# Patient Record
Sex: Male | Born: 1945 | Race: Black or African American | Hispanic: No | Smoking: Never smoker
Health system: Southern US, Community
[De-identification: ages and names within clinical notes are randomized; demographics above are authoritative.]

## PROBLEM LIST (undated history)

## (undated) DIAGNOSIS — I2510 Atherosclerotic heart disease of native coronary artery without angina pectoris: Secondary | ICD-10-CM

## (undated) DIAGNOSIS — E119 Type 2 diabetes mellitus without complications: Secondary | ICD-10-CM

## (undated) DIAGNOSIS — I219 Acute myocardial infarction, unspecified: Secondary | ICD-10-CM

## (undated) DIAGNOSIS — M109 Gout, unspecified: Secondary | ICD-10-CM

## (undated) DIAGNOSIS — I10 Essential (primary) hypertension: Secondary | ICD-10-CM

## (undated) DIAGNOSIS — Z87442 Personal history of urinary calculi: Secondary | ICD-10-CM

## (undated) DIAGNOSIS — N529 Male erectile dysfunction, unspecified: Secondary | ICD-10-CM

## (undated) DIAGNOSIS — I639 Cerebral infarction, unspecified: Secondary | ICD-10-CM

## (undated) DIAGNOSIS — E785 Hyperlipidemia, unspecified: Secondary | ICD-10-CM

## (undated) DIAGNOSIS — I499 Cardiac arrhythmia, unspecified: Secondary | ICD-10-CM

## (undated) DIAGNOSIS — N189 Chronic kidney disease, unspecified: Secondary | ICD-10-CM

## (undated) HISTORY — DX: Male erectile dysfunction, unspecified: N52.9

## (undated) HISTORY — DX: Chronic kidney disease, unspecified: N18.9

## (undated) HISTORY — DX: Atherosclerotic heart disease of native coronary artery without angina pectoris: I25.10

## (undated) HISTORY — PX: COLONOSCOPY: SHX174

## (undated) HISTORY — DX: Cerebral infarction, unspecified: I63.9

## (undated) HISTORY — PX: CATARACT EXTRACTION W/ INTRAOCULAR LENS  IMPLANT, BILATERAL: SHX1307

## (undated) HISTORY — PX: KNEE SURGERY: SHX244

## (undated) HISTORY — DX: Type 2 diabetes mellitus without complications: E11.9

## (undated) HISTORY — DX: Hyperlipidemia, unspecified: E78.5

## (undated) HISTORY — PX: MULTIPLE TOOTH EXTRACTIONS: SHX2053

---

## 1995-01-15 HISTORY — PX: CARDIAC CATHETERIZATION: SHX172

## 2001-10-05 ENCOUNTER — Encounter: Admission: RE | Admit: 2001-10-05 | Discharge: 2002-01-03 | Payer: Self-pay | Admitting: Family Medicine

## 2002-06-30 ENCOUNTER — Emergency Department (HOSPITAL_COMMUNITY): Admission: EM | Admit: 2002-06-30 | Discharge: 2002-06-30 | Payer: Self-pay | Admitting: Emergency Medicine

## 2003-02-21 ENCOUNTER — Inpatient Hospital Stay (HOSPITAL_COMMUNITY): Admission: RE | Admit: 2003-02-21 | Discharge: 2003-02-24 | Payer: Self-pay | Admitting: Interventional Cardiology

## 2003-03-21 ENCOUNTER — Encounter: Admission: RE | Admit: 2003-03-21 | Discharge: 2003-06-19 | Payer: Self-pay | Admitting: Interventional Cardiology

## 2003-06-20 ENCOUNTER — Encounter (HOSPITAL_COMMUNITY): Admission: RE | Admit: 2003-06-20 | Discharge: 2003-09-18 | Payer: Self-pay | Admitting: Interventional Cardiology

## 2005-08-24 ENCOUNTER — Emergency Department (HOSPITAL_COMMUNITY): Admission: EM | Admit: 2005-08-24 | Discharge: 2005-08-24 | Payer: Self-pay | Admitting: Emergency Medicine

## 2006-07-15 ENCOUNTER — Encounter: Admission: RE | Admit: 2006-07-15 | Discharge: 2006-07-15 | Payer: Self-pay | Admitting: Specialist

## 2007-07-04 ENCOUNTER — Emergency Department (HOSPITAL_COMMUNITY): Admission: EM | Admit: 2007-07-04 | Discharge: 2007-07-05 | Payer: Self-pay | Admitting: Emergency Medicine

## 2008-01-15 HISTORY — PX: CARDIAC CATHETERIZATION: SHX172

## 2008-09-07 ENCOUNTER — Inpatient Hospital Stay (HOSPITAL_COMMUNITY): Admission: AD | Admit: 2008-09-07 | Discharge: 2008-09-09 | Payer: Self-pay | Admitting: Interventional Cardiology

## 2008-09-22 ENCOUNTER — Encounter (HOSPITAL_COMMUNITY): Admission: RE | Admit: 2008-09-22 | Discharge: 2008-12-21 | Payer: Self-pay | Admitting: Interventional Cardiology

## 2008-11-05 ENCOUNTER — Observation Stay (HOSPITAL_COMMUNITY): Admission: EM | Admit: 2008-11-05 | Discharge: 2008-11-05 | Payer: Self-pay | Admitting: Emergency Medicine

## 2008-12-22 ENCOUNTER — Encounter (HOSPITAL_COMMUNITY): Admission: RE | Admit: 2008-12-22 | Discharge: 2009-01-10 | Payer: Self-pay | Admitting: Interventional Cardiology

## 2010-04-17 LAB — GLUCOSE, CAPILLARY
Glucose-Capillary: 111 mg/dL — ABNORMAL HIGH (ref 70–99)
Glucose-Capillary: 211 mg/dL — ABNORMAL HIGH (ref 70–99)

## 2010-04-19 LAB — DIFFERENTIAL
Basophils Absolute: 0 10*3/uL (ref 0.0–0.1)
Basophils Relative: 0 % (ref 0–1)
Eosinophils Absolute: 0.1 10*3/uL (ref 0.0–0.7)
Eosinophils Relative: 1 % (ref 0–5)
Lymphocytes Relative: 27 % (ref 12–46)
Lymphs Abs: 2.5 10*3/uL (ref 0.7–4.0)
Neutro Abs: 5.7 10*3/uL (ref 1.7–7.7)
Neutrophils Relative %: 63 % (ref 43–77)

## 2010-04-19 LAB — POCT I-STAT, CHEM 8
BUN: 18 mg/dL (ref 6–23)
Calcium, Ion: 1.14 mmol/L (ref 1.12–1.32)
Glucose, Bld: 213 mg/dL — ABNORMAL HIGH (ref 70–99)
HCT: 38 % — ABNORMAL LOW (ref 39.0–52.0)
Sodium: 142 mEq/L (ref 135–145)

## 2010-04-19 LAB — GLUCOSE, CAPILLARY
Glucose-Capillary: 164 mg/dL — ABNORMAL HIGH (ref 70–99)
Glucose-Capillary: 180 mg/dL — ABNORMAL HIGH (ref 70–99)
Glucose-Capillary: 71 mg/dL (ref 70–99)
Glucose-Capillary: 95 mg/dL (ref 70–99)

## 2010-04-19 LAB — CK TOTAL AND CKMB (NOT AT ARMC)
CK, MB: 3 ng/mL (ref 0.3–4.0)
Relative Index: 1.7 (ref 0.0–2.5)
Total CK: 172 U/L (ref 7–232)

## 2010-04-19 LAB — CARDIAC PANEL(CRET KIN+CKTOT+MB+TROPI): Relative Index: 1.9 (ref 0.0–2.5)

## 2010-04-19 LAB — CBC
Hemoglobin: 12.4 g/dL — ABNORMAL LOW (ref 13.0–17.0)
MCHC: 33.9 g/dL (ref 30.0–36.0)
RBC: 4.32 MIL/uL (ref 4.22–5.81)
RDW: 14.4 % (ref 11.5–15.5)

## 2010-04-19 LAB — LIPID PANEL
Triglycerides: 156 mg/dL — ABNORMAL HIGH (ref <150)
VLDL: 31 mg/dL (ref 0–40)

## 2010-04-19 LAB — POCT CARDIAC MARKERS
CKMB, poc: 2.4 ng/mL (ref 1.0–8.0)
Troponin i, poc: <0.05 ng/mL (ref 0.00–0.09)

## 2010-04-19 LAB — TSH: TSH: 1.288 u[IU]/mL (ref 0.350–4.500)

## 2010-04-19 LAB — HEMOGLOBIN A1C: Hgb A1c MFr Bld: 7.8 % — ABNORMAL HIGH (ref 4.6–6.1)

## 2010-04-20 LAB — GLUCOSE, CAPILLARY
Glucose-Capillary: 137 mg/dL — ABNORMAL HIGH (ref 70–99)
Glucose-Capillary: 145 mg/dL — ABNORMAL HIGH (ref 70–99)

## 2010-04-21 LAB — COMPREHENSIVE METABOLIC PANEL
ALT: 21 U/L (ref 0–53)
AST: 17 U/L (ref 0–37)
Albumin: 3.5 g/dL (ref 3.5–5.2)
Alkaline Phosphatase: 89 U/L (ref 39–117)
BUN: 9 mg/dL (ref 6–23)
CO2: 27 mEq/L (ref 19–32)
Calcium: 9 mg/dL (ref 8.4–10.5)
Calcium: 9.5 mg/dL (ref 8.4–10.5)
Creatinine, Ser: 1.03 mg/dL (ref 0.4–1.5)
GFR calc Af Amer: >60 mL/min (ref >60)
GFR calc non Af Amer: >60 mL/min (ref >60)
Potassium: 3.9 mEq/L (ref 3.5–5.1)
Sodium: 139 mEq/L (ref 135–145)
Total Protein: 5.7 g/dL — ABNORMAL LOW (ref 6.0–8.3)
Total Protein: 6.3 g/dL (ref 6.0–8.3)

## 2010-04-21 LAB — GLUCOSE, CAPILLARY
Glucose-Capillary: 104 mg/dL — ABNORMAL HIGH (ref 70–99)
Glucose-Capillary: 126 mg/dL — ABNORMAL HIGH (ref 70–99)
Glucose-Capillary: 162 mg/dL — ABNORMAL HIGH (ref 70–99)
Glucose-Capillary: 164 mg/dL — ABNORMAL HIGH (ref 70–99)
Glucose-Capillary: 186 mg/dL — ABNORMAL HIGH (ref 70–99)
Glucose-Capillary: 194 mg/dL — ABNORMAL HIGH (ref 70–99)
Glucose-Capillary: 66 mg/dL — ABNORMAL LOW (ref 70–99)

## 2010-04-21 LAB — BASIC METABOLIC PANEL
BUN: 11 mg/dL (ref 6–23)
Calcium: 8.9 mg/dL (ref 8.4–10.5)
Creatinine, Ser: 1.1 mg/dL (ref 0.4–1.5)
GFR calc non Af Amer: >60 mL/min (ref >60)
Glucose, Bld: 144 mg/dL — ABNORMAL HIGH (ref 70–99)

## 2010-04-21 LAB — DIFFERENTIAL
Basophils Relative: 1 % (ref 0–1)
Lymphocytes Relative: 37 % (ref 12–46)
Lymphs Abs: 2.6 10*3/uL (ref 0.7–4.0)
Monocytes Absolute: 0.4 10*3/uL (ref 0.1–1.0)
Monocytes Relative: 6 % (ref 3–12)
Neutro Abs: 3.9 10*3/uL (ref 1.7–7.7)

## 2010-04-21 LAB — PROTIME-INR
INR: 1 (ref 0.00–1.49)
Prothrombin Time: 14.1 seconds (ref 11.6–15.2)

## 2010-04-21 LAB — CARDIAC PANEL(CRET KIN+CKTOT+MB+TROPI)
CK, MB: 2.2 ng/mL (ref 0.3–4.0)
CK, MB: 4.5 ng/mL — ABNORMAL HIGH (ref 0.3–4.0)
Relative Index: 1.9 (ref 0.0–2.5)
Total CK: 117 U/L (ref 7–232)
Total CK: 128 U/L (ref 7–232)
Troponin I: 0.01 ng/mL (ref 0.00–0.06)
Troponin I: 0.01 ng/mL (ref 0.00–0.06)
Troponin I: 0.01 ng/mL (ref 0.00–0.06)

## 2010-04-21 LAB — CBC
HCT: 36.6 % — ABNORMAL LOW (ref 39.0–52.0)
HCT: 38.9 % — ABNORMAL LOW (ref 39.0–52.0)
Hemoglobin: 12.1 g/dL — ABNORMAL LOW (ref 13.0–17.0)
MCHC: 32.9 g/dL (ref 30.0–36.0)
MCHC: 33.2 g/dL (ref 30.0–36.0)
MCHC: 33.3 g/dL (ref 30.0–36.0)
MCV: 84.3 fL (ref 78.0–100.0)
Platelets: 226 10*3/uL (ref 150–400)
RDW: 13.9 % (ref 11.5–15.5)
RDW: 14 % (ref 11.5–15.5)
RDW: 14.1 % (ref 11.5–15.5)

## 2010-04-21 LAB — APTT
aPTT: 34 seconds (ref 24–37)
aPTT: 40 seconds — ABNORMAL HIGH (ref 24–37)

## 2010-05-29 NOTE — Discharge Summary (Signed)
NAME:  TATEZayn, Selley                  ACCOUNT NO.:  [id number]   MEDICAL RECORD NO.:  [id number]          PATIENT TYPE:  INP   LOCATION:  2506                         FACILITY:  [facility]   PHYSICIAN:  Lyn Records, M.D.   DATE OF BIRTH:  Dec 29, 1945   DATE OF ADMISSION:  09/07/2008  DATE OF DISCHARGE:  09/09/2008                               DISCHARGE SUMMARY   INDICATION FOR ADMISSION:  Progressive angina, class III.   DISCHARGE DIAGNOSES:  1. Class III angina, resolved after re-stenting of the posterior      descending coronary artery for in-stent restenosis on September 08, 2008.  2. Chronic coronary atherosclerotic heart disease.  3. Hypertension, controlled.  4. Diabetes mellitus.  5. Hyperlipidemia.   PROCEDURES PERFORMED:  Drug-eluting stent implantation of posterior  descending coronary artery and cutting balloon angioplasty, ramus  intermedius branch on September 08, 2008.   MEDICATIONS:  1. Plavix 75 mg daily.  2. Aspirin 325 mg daily.  3. Nitroglycerin 0.4 mg sublingually p.r.n.  4. Folic acid 400 mcg per day.  5. Multiple vitamins 1 per day.  6. Avapro 150 mg per day.  7. Toprol-XL 25 mg per day.  8. Omega-3 fatty acid 1000 mg 3 per day.  9. Metformin 1000 mg twice per day.  10.Humalog 75/25 Mix 50 units with the first meal of the day.  11.Humalog Mix 75/25, 20 units with evening meal each day.  12.Lipitor 40 mg daily.  13.Actos 45 mg daily.   ACTIVITY:  Progress as indicated by Cardiac Rehab.  Return to work on  September 14, 2008.  Enroll in phase II cardiac rehab.   CONDITION ON DISCHARGE:  Improved.  The patient is specifically  instructed to call if recurrent angina.   HISTORY AND PHYSICAL AND HOSPITAL COURSE:  Mr. Cottrell Gentles is 65 years of  age and has a prior history of coronary artery disease with drug-eluting  stent implantation in the LAD and right coronary arteries in 2005.  Prior inferior myocardial infarction in 1999, treated with angioplasty.  He  was admitted with a 1-2 weeks' history of angina of variable  threshold and ultimately pain at rest on the day of admission.  Cardiac  markers were negative.   The following day, he was taken to the Catheterization Laboratory on  September 08, 2008, and found to have high-grade focal in-stent restenosis  in the drug-eluting stent in the posterior descending artery and ostial  stenosis in the ramus intermedius branch and just proximal to a drug-  eluting stent in this vessel.  He underwent successful re-stenting using  a drug-eluting stent of the PDA and cutting balloon angioplasty on the  ostium of the ramus intermedius.  Subsequently, his hospital stay was  uneventful.  No recurrent angina was noted.   LABORATORY DATA ON DISCHARGE FROM THE HOSPITAL:  Creatinine of 1.1,  hemoglobin of 12.1, and blood sugar of 144.   The patient is restarted on Plavix 75 mg per day.  He is encouraged to  enroll in cardiac rehab.  Clinical followup with  Dr. Verdis Prime is set  for September 21, 2008, at 9 a.m.      Lyn Records, M.D.  Electronically Signed     HWS/MEDQ  D:  09/09/2008  T:  09/09/2008  Job:  [id number]

## 2010-05-29 NOTE — Cardiovascular Report (Signed)
NAME:  Joseph Sanford, Joseph Sanford                  ACCOUNT NO.:  [id number]   MEDICAL RECORD NO.:  [id number]          PATIENT TYPE:  INP   LOCATION:  2506                         FACILITY:  [facility]   PHYSICIAN:  Lyn Records, M.D.   DATE OF BIRTH:  [birth year]   DATE OF PROCEDURE:  09/08/2008  DATE OF DISCHARGE:                            CARDIAC CATHETERIZATION   INDICATIONS:  Angina at rest responsive to nitroglycerin for the past  week prior to admission.   PROCEDURES PERFORMED:  1. Left heart cath.  2. Coronary angiography.  3. Left ventriculography.  4. Drug-eluting stent implantation in the posterior descending artery      for in-stent restenosis, focal   DESCRIPTION:  The patient was brought to the cath lab in the  postabsorptive state.  A 6-French sheath was started in the right  femoral artery using the modified Seldinger technique.  We then used a 6-  [place] A2 multipurpose catheter for hemodynamic recordings, left  ventriculography by hand injection, and selective left and right  coronary angiography.  A 200 mcg of intracoronary nitroglycerin was  administered into the left main and into the right coronary prior to  angiography of each vessel.   After reviewing the digital images, we identified diffuse disease  throughout many smaller vascular territories including the diagonal #2,  which is jailed by previous LAD stent, the ramus intermedius, which  contains a midvessel severe bifurcation lesion, the obtuse marginal  branches #3 contained diffuse disease, and the mid RCA contains  segmental 50% narrowing.  The previous distal RCA stent is widely  patent.  The PDA stent contained a high-grade focal proximal ISR.  We  felt that this was the likely source of the patient's angina at rest is  the most reasonable target to treat.   The patient was given an additional 225 mg of oral Plavix.  A 0.75 mg/kg  bolus of Angiomax was given followed by 1.75 mg/kg/hour infusion.  ACT  was  documented to be greater than 300.  PCI on the right coronary was  then performed using a shepherd's crook 6-French right coronary guide  catheter, a Prowater Asahi guidewire, and an initial 2.5 mm diameter x 6  mm long cutting balloon.  After a 1-minute 30-second cutting balloon  dilatation, and 8 mm long x 2.5 mm XIENCE V stent was deployed at 14  atmospheres.  Postdilatation was performed with a 6 mm long x 2.75 mm Garey  Voyager balloon to 13 atmospheres.  A very nice angiographic result was  obtained and the case was terminated.   Angio-Seal was used for hemostasis with good results.   RESULTS:  1. Hemodynamic data:      a.     Aortic pressure 100/57.      b.     Left ventricular pressure 106/80 mmHg.  The patient was on       20 mcg of nitro upon arrival in the cath lab and during pressure       recordings.  The nitro was cut in half after measuring the EDP.  2. Left  ventriculography:  The inferior wall was hypokinetic.  Overall      function is normal with EF of 50%.  3. Coronary angiography:      a.     Left main coronary:  Widely patent.      b.     Left anterior descending coronary:  The LAD is a large       vessel that wraps around left ventricular apex.  It contains a mid       vessel stent that is widely patent.  Irregularities was up to 50%       narrowing noted in the mid and proximal LAD.  No high-grade       obstruction is present.  The second diagonal, which is jailed by       the stent contains ostial 60% narrowing.      c.     Circumflex artery:  Circumflex gives origin to 4 small       obtuse marginal branches.  Each is diffusely diseased and none are       graftable.  The proximal circumflex contains an eccentric 50%       lesion.      d.     Ramus branch:  The ramus intermedius is moderate in size and       bifurcates on anterolateral wall.  At the bifurcation, there is a       complex 70% stenosis involving the branch as well as the ramus       both have the  origin of the branch and proximal and distal to the       branch.  This could be potentially treated with PCI if necessary       in the future.      e.     Right coronary:  The right coronary artery is dominant       vessel giving origin to a large PDA and large left ventricular       branch.  The mid right coronary contains diffuse disease with       obstruction up to 50%, but no focal disease is noted.  The distal       RCA contains an eccentric 40% stenosis proximal to a large widely       patent Cypher stent before the bifurcation into the PDA and       continuation of the right coronary.  The PDA is large and contains       high-grade proximal focal in-stent restenosis in the previously       placed stent.  The continuation of the right coronary contains an       eccentric 70-80% stenosis in its proximal segment that is for the       most part, unchanged from the prior study.  4. PCI of the PDA:  95% stenosis reduced to 0% with TIMI grade 3 flow.   CONCLUSIONS:  1. Severe in-stent restenosis within the posterior descending artery.      This is a focal region of restenosis.  2. There is disease involving other smaller branches including an 80%      ramus intermedius bifurcation lesion, 60-70% lesion in the      posterolateral obtuse marginal branch of the right coronary artery,      diffuse disease in all 3 or 4 obtuse marginal branches, less than      50% stenosis in the mid and proximal left anterior descending, and  70% stenosis in the second diagonal, which is jailed by the left      anterior descending stent.  3. Widely patent distal right coronary and mid left anterior      descending stents.  4. Overall normal left ventricular function.  5. Successful drug-eluting stent implantation in the proximal      posterior descending artery with reduction in 95% stenosis to 0%      with TIMI grade 3 flow.   PLAN:  Continue aspirin and Plavix.  Discontinue Angiomax.  Home on   September 09, 2008.      Lyn Records, M.D.  Electronically Signed     HWS/MEDQ  D:  09/08/2008  T:  09/09/2008  Job:  [id number]   cc:   Donia Guiles, M.D.

## 2010-06-01 NOTE — Cardiovascular Report (Signed)
NAME:  Joseph Sanford, Joseph Sanford                            ACCOUNT NO.:  [id number]   MEDICAL RECORD NO.:  [id number]                   PATIENT TYPE:  OIB   LOCATION:  2899                                 FACILITY:  [facility]   PHYSICIAN:  Lyn Records III, M.D.            DATE OF BIRTH:  01-Jul-1945   DATE OF PROCEDURE:  02/21/2003  DATE OF DISCHARGE:                              CARDIAC CATHETERIZATION   INDICATIONS FOR PROCEDURE:  Recent change in EKG to include anterior Q wave  suggestive of new anterior myocardial infarction, silent.  Also, recent  exertional discomfort.   DATE OF PROCEDURE:  February 21, 2003.   PROCEDURE PERFORMED:  1. Left heart catheterization.  2. Selective coronary angiography.  3. Left ventriculography.  4. Left anterior descending stent implantation, Taxus drug-eluting.   DESCRIPTION:  After informed consent, the patient was brought to the cath  lab and a 6-French sheath was placed in the right femoral artery using  modified Seldinger technique.  A 6-French A2 multipurpose catheter was then  used for hemodynamic recordings, left ventriculography by hand injection,  and selective left and right coronary angiography.  The patient tolerated  the procedure without significant complications.   After review of the scintiangiograms, it was felt that the patient either  needed bypass surgery or that PCI with drug-eluting stents in the LAD and  right coronary could be performed.  We decided to perform PCI and a Taxus  drug-eluting stent 3.0 x 16-mm was placed in the mid LAD after predilation  with a 3.0 x 15-mm Maverick balloon.  Peak pressure 12 atmospheres.  The  patient tolerated the procedure without complications.  TIMI-3 flow was  established.  Distal embolization to the very apical LAD was felt to have  occurred.  No symptoms occurred during the procedure or post procedure and  the case was terminated.   The pharmacologic infrastructure for the case including  Plavix 300 mg given  in the lab, double bolus followed by an infusion of integrilin and 5000  units of IV heparin with documentation of an initial ACT of 302 seconds.  Post ACT was 226 seconds.   RESULTS:   I. HEMODYNAMIC DATA:  A.  Left ventricular pressure:  125/12.  B.  Aortic pressure:  132/87.   II. LEFT VENTRICULOGRAPHY:  There is infra-apical hypokinesis.  EF 55%.  No  MR.   III. CORONARY ANGIOGRAPHY:  A.  Left main coronary:  Patent.  There is  ostial 30-40% narrowing.  B.  Left anterior descending coronary:  The LAD is totally occluded after  the first septal perforator.  The LAD fills around the apex via septal  perforators from the PDA of the right coronary.  No significant obstruction  is noted in the LAD other than 100% mid occlusion.  There is proximal 30-50%  narrowing.  C.  Circumflex artery:  This is a relatively small system given  origin to  two small obtuse marginals.  There is proximal 70% narrowing.  D.  Right coronary:  The right coronary is a dominant vessel.  It gives a  large PDA that reaches the left ventricular apex.  The right coronary is a  previously dilated vessel.  There is 50% narrowing in the mid vessel.  There  is 99% stenosis prior to the PDA.  The PDA contains an eccentric 70%  stenosis.  This is a previous angioplasty site and the continuation of the  right coronary artery before the origin of a left ventricular branch has a  70% stenosis.   IV. PERCUTANEOUS CORONARY INTERVENTION:  LAD reduced from 100% to 0% with  TIMI-3 flow being reestablished after implantation of a 16 x 3.0 Taxus DES.  The very distal apical portion of the LAD is occluded probably from  antegrade embolization.   ASSESSMENT:  1. Significant three-vessel coronary disease with 70% circumflex, 100% left     anterior descending, 99% right coronary artery.  2. Mild left ventricular dysfunction, but with preserved LVEF of 55%.  3. Successful recannulization of the left  anterior descending with     implantation of Taxus drug-eluting stent in the mid left anterior     descending.   PLAN:  The patient will undergo right coronary and PDA intervention in 48  hours.  Integrilin will be given for 24 hours.  Plan to use Angiomax on next  PCI.  Plavix will be continued.                                               Lesleigh Noe, M.D.    HWS/MEDQ  D:  02/21/2003  T:  02/21/2003  Job:  [id number]   cc:   Donia Guiles, M.D.  [address] [place]  [place] [address]  Fax: [phone]

## 2010-06-01 NOTE — Cardiovascular Report (Signed)
NAME:  Joseph Sanford, Joseph Sanford                            ACCOUNT NO.:  [id number]   MEDICAL RECORD NO.:  [id number]                   PATIENT TYPE:  OIB   LOCATION:  2903                                 FACILITY:  [facility]   PHYSICIAN:  Lesleigh Noe, M.D.            DATE OF BIRTH:  1945/11/14   DATE OF PROCEDURE:  02/23/2003  DATE OF DISCHARGE:                              CARDIAC CATHETERIZATION   INDICATIONS FOR PROCEDURE:  High grade distal right coronary and PDA disease  identified by diagnostic catheterization on February 21, 2003.   DATE OF PROCEDURE:  February 23, 2003.   PROCEDURE PERFORMED:  1. Coronary angiography.  2. Stent implantation with a Cypher drug-eluting stent in the distal right     coronary artery.  3. Stent implantation in the posterior descending artery, Cypher drug-     eluting stent.   DESCRIPTION:  After informed consent, a 6-French sheath was started in the  right femoral artery using modified Seldinger technique after 2% Xylocaine  local anesthesia/infiltration.  The patient was given 1 mg of IV Versed and  50 mcg of Fentanyl IV.  An Angiomax bolus and infusion was started and ACT  was documented to greater than 250 seconds.   We used a side hole hockey stick guide catheter, 6 [place], and [employer] wire to cross the distal RCA and PDA and then we predilated the  distal RCA and PDA with a 2.5/12 Quantum balloon.  We then deployed a 13/2.5  mm Cypher stent in the PDA and an 18 x 3.0 mm Cypher stent in the distal  RCA, both with excellent angiographic results and TIMI-3 flow.  Angiomax was  discontinued. A sheathogram was performed in the right femoral sheath.  Angio-Seal was performed without complications.  Angiomax was discontinued  at the completion of the case.   CONCLUSION:  1. Successful drug-eluting stent implantation in the distal right coronary     artery taken the lesion from 95% to 0% and successful drug-eluting Cypher     stent implantation  in the posterior descending artery taking an 80%     stenosis to 0%.  2. Successful Angio-Seal post procedure.   PLAN:  1. Because of chronic cough, Accupril will be discontinued and Avapro     started.  2. Angiomax discontinued.  3. Hopeful discharge February 24, 2003.                                               Lesleigh Noe, M.D.    HWS/MEDQ  D:  02/23/2003  T:  02/24/2003  Job:  [id number]   cc:   Donia Guiles, M.D.  [address] [place]  [place] [address]  Fax: [phone]

## 2010-06-01 NOTE — Discharge Summary (Signed)
NAME:  Joseph Sanford, Joseph Sanford                            ACCOUNT NO.:  [id number]   MEDICAL RECORD NO.:  [id number]                   PATIENT TYPE:  INP   LOCATION:  2903                                 FACILITY:  [facility]   PHYSICIAN:  Lyn Records, M.D.                DATE OF BIRTH:  March 17, 1945   DATE OF ADMISSION:  02/21/2003  DATE OF DISCHARGE:  02/24/2003                                 DISCHARGE SUMMARY   ADMISSION DIAGNOSES:  1. New EKG changes.  2. Questionable anterior myocardial infarction.  3. Hypertension.  4. Noninsulin-dependent diabetes.  5. Hyperlipidemia.   DISCHARGE DIAGNOSES:  1. Coronary artery disease.     A. Status post percutaneous coronary intervention and Taxus stent        placement to the left anterior descending artery February 21, 2003.     B. Status post percutaneous coronary intervention and Cypher stent        placement to the distal right coronary artery February 23, 2003.  2. Hypertension, controlled.  3. Hyperlipidemia, stable.  4. Noninsulin-dependent diabetes.   PROCEDURE:  1. Left heart catheterization with percutaneous coronary intervention to the     left anterior descending artery February 21, 2003.  2. Left heart catheterization with percutaneous coronary intervention to the     right coronary artery February 23, 2003.   COMPLICATIONS:  None.   DISCHARGE STATUS:  Stable. Improved.   HISTORY OF PRESENT ILLNESS:  Joseph Sanford is a 65 year old male who presented to  the office exhibiting recent EKG changes showing anterior Q waves. He was  evaluated by Dr. Katrinka Blazing with a question of new recent silent anterior  infarct. He had been having some mild exertional chest discomfort. He was  planned for outpatient cardiac catheterization for further evaluation. He  presented to the office on February 21, 2003 for this outpatient procedure.  Results showed the left main with a 30 to 40% ostial narrowing. The LAD was  100% occluded after the first septal  perforator. It actually fills around  the apex by the septal perforators from the PDA. There was an additional  proximal area of 30 to 50% noted in the LAD. The circumflex had a proximal  area of 70%. The RCA was the dominant vessel. There was an area of 50% in  the mid section and 99% just prior to the PDA. The PDA itself had an  eccentric 70% stenosis. There was an additional area that was a previous  angioplasty site just before the origin of the left ventricular branch with  a 70% stenosis. Ventriculography revealed apical hypokinesis. EF was  estimated at 55%.   That same day, he underwent PCI and Taxus stent placement to the LAD. He had  plans to return to the cardiac catheterization lab in two days for  intervention to the RCA and PDA. In the interim, he was started on IV  Integrilin. Plavix was also continued.   Essentially for the rest of this hospitalization, he was without any  complaints. He had no chest pain. Vital signs all remained within normal  limits. Blood sugars were up somewhat with the metformin being on hold.  Sliding scale coverage was added.   On February 23, 2003, he was taken back to the cardiac catheterization lab.  He underwent successful PCI and Cypher stent placement to the distal RCA.  The patient tolerated both procedures well. There were no complications. At  this point, his Accupril was discontinued by Dr. Katrinka Blazing secondary to  complaints of a nonproductive cough. He was started on Avapro.   By February 24, 2003, the patient was without any complaints. He was up and  ambulating without difficulty. Vital signs were all within normal limits.  Repeat CBC and BMP were normal with the exception of mild hyperglycemia.  Right femoral groin catheterization site was without hematoma or bruising.  He was discharged home later that day without further incident.   DISCHARGE MEDICATIONS:  1. Actos 30 mg daily.  2. Lipitor 40 mg daily.  3. Avapro 150 mg daily.   4. Aspirin 325 mg daily.  5. Plavix 75 mg daily.  6. Toprol-XL 25 mg daily.  7. Nitroglycerin 0.4 mg p.r.n.  8. 70/30 insulin as taken previously.  9. Tussionex one tablespoon b.i.d. p.r.n. cough.   DISCHARGE INSTRUCTIONS:  He has been instructed not to undertake any  strenuous activity for the next two days, i.e. no lifting more than five  pounds, bending, stooping, straining. He is not to drive today. He is not to  engage in sexual activity for the next two days. He is to maintain a low  fat, low salt diet as well as his diabetic diet restrictions.   If he has any complaints of pain, bruising, or swelling to his right groin  catheterization site, he is to contact Dr. Katrinka Blazing.   He was instructed not to resume taking his Accupril.   He has an appointment to see Dr. Katrinka Blazing for followup on Wednesday, March 09, 2003 at 11:15 a.m.      Adrian Saran, N.P.                        Lyn Records, M.D.    HB/MEDQ  D:  03/22/2003  T:  03/23/2003  Job:  [id number]

## 2010-10-11 LAB — CBC
HCT: 39.8
Hemoglobin: 13.1
MCHC: 32.8
MCV: 82.5
RDW: 13.9

## 2011-06-27 ENCOUNTER — Other Ambulatory Visit: Payer: Self-pay | Admitting: Family Medicine

## 2011-06-27 DIAGNOSIS — R209 Unspecified disturbances of skin sensation: Secondary | ICD-10-CM

## 2011-06-28 ENCOUNTER — Ambulatory Visit
Admission: RE | Admit: 2011-06-28 | Discharge: 2011-06-28 | Disposition: A | Discharge status: Home / Self Care | Payer: Medicare Other | Source: Ambulatory Visit | Attending: Family Medicine | Admitting: Family Medicine

## 2011-06-28 DIAGNOSIS — R209 Unspecified disturbances of skin sensation: Secondary | ICD-10-CM

## 2011-07-23 ENCOUNTER — Other Ambulatory Visit: Payer: Self-pay | Admitting: Family Medicine

## 2011-07-23 DIAGNOSIS — G8190 Hemiplegia, unspecified affecting unspecified side: Secondary | ICD-10-CM

## 2011-07-27 ENCOUNTER — Ambulatory Visit
Admission: RE | Admit: 2011-07-27 | Discharge: 2011-07-27 | Disposition: A | Discharge status: Home / Self Care | Payer: Medicare Other | Source: Ambulatory Visit | Attending: Family Medicine | Admitting: Family Medicine

## 2011-07-27 DIAGNOSIS — G8190 Hemiplegia, unspecified affecting unspecified side: Secondary | ICD-10-CM

## 2011-08-04 ENCOUNTER — Ambulatory Visit
Admission: RE | Admit: 2011-08-04 | Discharge: 2011-08-04 | Disposition: A | Discharge status: Home / Self Care | Payer: Medicare Other | Source: Ambulatory Visit | Attending: Family Medicine | Admitting: Family Medicine

## 2011-08-11 ENCOUNTER — Ambulatory Visit
Admission: RE | Admit: 2011-08-11 | Discharge: 2011-08-11 | Disposition: A | Discharge status: Home / Self Care | Payer: Medicare Other | Source: Ambulatory Visit | Attending: Family Medicine | Admitting: Family Medicine

## 2011-08-11 MED ORDER — GADOBENATE DIMEGLUMINE 529 MG/ML IV SOLN
18.0000 mL | Freq: Once | INTRAVENOUS | Status: AC | PRN
  Administered 2011-08-11: 18 mL via INTRAVENOUS

## 2014-01-18 ENCOUNTER — Encounter: Payer: Commercial Managed Care - HMO | Attending: Family Medicine | Admitting: Nutrition

## 2014-01-18 DIAGNOSIS — Z713 Dietary counseling and surveillance: Secondary | ICD-10-CM | POA: Insufficient documentation

## 2014-01-18 DIAGNOSIS — E118 Type 2 diabetes mellitus with unspecified complications: Secondary | ICD-10-CM | POA: Insufficient documentation

## 2014-01-18 DIAGNOSIS — IMO0002 Reserved for concepts with insufficient information to code with codable children: Secondary | ICD-10-CM

## 2014-01-18 DIAGNOSIS — E1165 Type 2 diabetes mellitus with hyperglycemia: Secondary | ICD-10-CM

## 2014-01-18 NOTE — Patient Instructions (Signed)
Fill and apply a new V-go 20 every day. Take no more 70/30 insulin Give one button presseach before the meals that contain: sweet drinks, hamburger, and deserts, spaghetti.

## 2014-01-18 NOTE — Progress Notes (Signed)
Pt. Is suppose to be on Novolog Mix 70/30  50u In AM and 30u acS.  He is only taking the AM dose--maybe once a week, when blood sugars are high in the AM.  If blood sugars are normal or less than 150, he will take no insulin in the AM. He take his PM does after supper--before bedtime.    We discusses how the V-Go works, how to fill apply and use the V-go.  He was given a starter kit, and Novolog insulin.  He is aware that he will not take any more 70/30 insulin.  Since he took no insulin this AM, he filled and applied the V-go 20, following the directions in the starter kit, to his abdominal area.  He was shown how to give the meal time insulin, and that he needs to do this before meals that have more than one serving of carbohydrate.  We discussed what carbs were, and identified what carbs he is eating at each meal.     He eats breakfast rarely, and when he does, it is just a 1/2 cup of grits.  He will take no meal time insulin for this.  His lunch is variable in the amounts of carbs eaten, but he does drink sweet tea--16 ounces.  His supper is low in carbs for the meal, but sometimes drinks sweet tea.  He does not want to give up his sweet tea, so he was given a sheet with amounts of button presses before the meals, that he ususally eats.  He will take no insulin if eating only one serving of carbohdyrate, 1 button presses for a hamburger and 1 for a sweet tea drink.  We discussed the fact that he needs to give a meal time insulin dose based on the amount of carbs eaten, and he is aware that each button press is 2u of fast acting insulin.    He has agreed to test his blood sugars before meals and at bedtime, to evaluate how this is working for him. He was given extra One Touch test strips for this.    I will call him in 2 days to see how he is doing, and make adjustments in his meal time insulin dose.  He was instructed to call [facility] customer care, so that they can determine insurance coverage, and  to see how much this will cost him.  He agreed to do this.   He was given my number to call me/or [place], if he has questions about how to fill, apply, remove or give the insulin.  He had no final questions.

## 2014-02-08 ENCOUNTER — Ambulatory Visit: Payer: Medicare Other | Admitting: Dietician

## 2014-02-15 DIAGNOSIS — H4312 Vitreous hemorrhage, left eye: Secondary | ICD-10-CM | POA: Diagnosis not present

## 2014-02-15 DIAGNOSIS — E11359 Type 2 diabetes mellitus with proliferative diabetic retinopathy without macular edema: Secondary | ICD-10-CM | POA: Diagnosis not present

## 2014-03-23 ENCOUNTER — Ambulatory Visit
Admission: RE | Admit: 2014-03-23 | Discharge: 2014-03-23 | Disposition: A | Discharge status: Home / Self Care | Payer: Commercial Managed Care - HMO | Source: Ambulatory Visit | Attending: Family Medicine | Admitting: Family Medicine

## 2014-03-23 ENCOUNTER — Other Ambulatory Visit: Payer: Self-pay | Admitting: Family Medicine

## 2014-03-23 DIAGNOSIS — M19071 Primary osteoarthritis, right ankle and foot: Secondary | ICD-10-CM | POA: Diagnosis not present

## 2014-03-23 DIAGNOSIS — T148XXA Other injury of unspecified body region, initial encounter: Secondary | ICD-10-CM

## 2014-03-23 DIAGNOSIS — M79671 Pain in right foot: Secondary | ICD-10-CM | POA: Diagnosis not present

## 2014-04-19 DIAGNOSIS — Z1211 Encounter for screening for malignant neoplasm of colon: Secondary | ICD-10-CM | POA: Diagnosis not present

## 2014-04-19 DIAGNOSIS — E782 Mixed hyperlipidemia: Secondary | ICD-10-CM | POA: Diagnosis not present

## 2014-04-19 DIAGNOSIS — E11319 Type 2 diabetes mellitus with unspecified diabetic retinopathy without macular edema: Secondary | ICD-10-CM | POA: Diagnosis not present

## 2014-04-19 DIAGNOSIS — E1121 Type 2 diabetes mellitus with diabetic nephropathy: Secondary | ICD-10-CM | POA: Diagnosis not present

## 2014-04-19 DIAGNOSIS — I1310 Hypertensive heart and chronic kidney disease without heart failure, with stage 1 through stage 4 chronic kidney disease, or unspecified chronic kidney disease: Secondary | ICD-10-CM | POA: Diagnosis not present

## 2014-04-19 DIAGNOSIS — N183 Chronic kidney disease, stage 3 (moderate): Secondary | ICD-10-CM | POA: Diagnosis not present

## 2014-04-19 DIAGNOSIS — I2510 Atherosclerotic heart disease of native coronary artery without angina pectoris: Secondary | ICD-10-CM | POA: Diagnosis not present

## 2014-05-03 DIAGNOSIS — E11359 Type 2 diabetes mellitus with proliferative diabetic retinopathy without macular edema: Secondary | ICD-10-CM | POA: Diagnosis not present

## 2014-05-17 DIAGNOSIS — Z86010 Personal history of colonic polyps: Secondary | ICD-10-CM | POA: Diagnosis not present

## 2014-05-17 DIAGNOSIS — Z79899 Other long term (current) drug therapy: Secondary | ICD-10-CM | POA: Diagnosis not present

## 2014-05-18 DIAGNOSIS — B349 Viral infection, unspecified: Secondary | ICD-10-CM | POA: Diagnosis not present

## 2014-05-18 DIAGNOSIS — R509 Fever, unspecified: Secondary | ICD-10-CM | POA: Diagnosis not present

## 2014-06-29 DIAGNOSIS — E785 Hyperlipidemia, unspecified: Secondary | ICD-10-CM | POA: Insufficient documentation

## 2014-06-29 DIAGNOSIS — I6930 Unspecified sequelae of cerebral infarction: Secondary | ICD-10-CM | POA: Insufficient documentation

## 2014-06-29 DIAGNOSIS — I25810 Atherosclerosis of coronary artery bypass graft(s) without angina pectoris: Secondary | ICD-10-CM | POA: Insufficient documentation

## 2014-06-29 DIAGNOSIS — I10 Essential (primary) hypertension: Secondary | ICD-10-CM | POA: Insufficient documentation

## 2014-07-01 ENCOUNTER — Encounter: Payer: Self-pay | Admitting: Interventional Cardiology

## 2014-07-01 ENCOUNTER — Ambulatory Visit (INDEPENDENT_AMBULATORY_CARE_PROVIDER_SITE_OTHER): Payer: Commercial Managed Care - HMO | Admitting: Interventional Cardiology

## 2014-07-01 VITALS — BP 132/68 | HR 52 | Ht 70.5 in | Wt 177.8 lb

## 2014-07-01 DIAGNOSIS — E785 Hyperlipidemia, unspecified: Secondary | ICD-10-CM

## 2014-07-01 DIAGNOSIS — I2510 Atherosclerotic heart disease of native coronary artery without angina pectoris: Secondary | ICD-10-CM

## 2014-07-01 DIAGNOSIS — E118 Type 2 diabetes mellitus with unspecified complications: Secondary | ICD-10-CM

## 2014-07-01 DIAGNOSIS — I10 Essential (primary) hypertension: Secondary | ICD-10-CM | POA: Diagnosis not present

## 2014-07-01 DIAGNOSIS — IMO0002 Reserved for concepts with insufficient information to code with codable children: Secondary | ICD-10-CM

## 2014-07-01 DIAGNOSIS — E1165 Type 2 diabetes mellitus with hyperglycemia: Secondary | ICD-10-CM

## 2014-07-01 DIAGNOSIS — I6930 Unspecified sequelae of cerebral infarction: Secondary | ICD-10-CM

## 2014-07-01 DIAGNOSIS — Z8673 Personal history of transient ischemic attack (TIA), and cerebral infarction without residual deficits: Secondary | ICD-10-CM

## 2014-07-01 NOTE — Progress Notes (Signed)
Cardiology Office Note   Date:  07/01/2014   ID:  Devlyn, Retter 04-07-1945, MRN [id number]  PCP:  No primary care provider on file.  Cardiologist:  Sinclair Grooms, MD   Chief Complaint  Patient presents with  . Coronary Artery Disease      History of Present Illness: Joseph Sanford is a 69 y.o. male who presents for CAD, CVA, hyperlipidemia, hypertension, and background chronic kidney disease related to diabetes mellitus.  He has a mild sense of discomfort in his chest when he does moderately heavy physical activity. It goes away with rest. 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 He is not using nitroglycerin. He denies orthopnea and PND. There is no claudication. No neurological complaints.    Past Medical History  Diagnosis Date  . Coronary artery disease   . Diabetes mellitus without complication   . Hyperlipidemia   . ED (erectile dysfunction)   . CVA (cerebral infarction)   . Chronic kidney disease     Past Surgical History  Procedure Laterality Date  . Coronary artery bypass graft       Current Outpatient Prescriptions  Medication Sig Dispense Refill  . aspirin 81 MG tablet Take 81 mg by mouth daily.    Marland Kitchen b complex vitamins tablet Take 1 tablet by mouth daily.    . Calcium Carbonate-Vitamin D (CALCIUM-D) 600-400 MG-UNIT TABS Take by mouth.    . Cholecalciferol (CVS D3) 2000 UNITS CAPS Take 1 capsule by mouth daily.     . clopidogrel (PLAVIX) 75 MG tablet Take 75 mg by mouth daily.    . folic acid (FOLVITE) 546 MCG tablet Take 400 mcg by mouth daily.    . nitroGLYCERIN (NITROSTAT) 0.4 MG SL tablet Place 0.4 mg under the tongue every 5 (five) minutes as needed for chest pain.    . Omega-3 Fatty Acids (FISH OIL) 1200 MG CAPS Take 1 capsule by mouth every morning.     . Zinc 50 MG CAPS Take 50 mg by mouth daily.      No current facility-administered medications for this visit.    Allergies:   Lipitor; Iodine;  Lisinopril; and Shellfish allergy    Social History:  The patient  reports that he has never smoked. He does not have any smokeless tobacco history on file.   Family History:  The patient's family history is not on file.    ROS:  Please see the history of present illness.   Otherwise, review of systems are positive for decreased appetite, mild dysphagia, and weight loss. He is down approximately 7 pounds since being weight in April by his primary care physician. He has occasional diarrhea. Occasional cough. Decreased hearing. Vision disturbance related to retinopathy and cataracts..   All other systems are reviewed and negative.    PHYSICAL EXAM: VS:  BP 132/68 mmHg  Pulse 52  Ht 5' 10.5" (1.791 m)  Wt 80.65 kg (177 lb 12.8 oz)  BMI 25.14 kg/m2 , BMI Body mass index is 25.14 kg/(m^2). GEN: Well nourished, well developed, in no acute distress HEENT: normal Neck: no JVD, carotid bruits, or masses Cardiac: RRR; no murmurs, rubs, or gallops,no edema  Respiratory:  clear to auscultation bilaterally, normal work of breathing GI: soft, nontender, nondistended, + BS MS: no deformity or atrophy Skin: warm and dry, no rash Neuro:  Strength and sensation are intact Psych: euthymic mood, full affect   EKG:  EKG is  ordered today. The ekg ordered today demonstrates sinus bradycardia and otherwise normal with the exception of the inferior T-wave abnormality.   Recent Labs: No results found for requested labs within last 365 days.    Lipid Panel    Component Value Date/Time   CHOL  11/05/2008 0159    91        ATP III CLASSIFICATION:  <200     mg/dL   Desirable  200-239  mg/dL   Borderline High  >=240    mg/dL   High          TRIG 156* 11/05/2008 0159   HDL 32* 11/05/2008 0159   CHOLHDL 2.8 11/05/2008 0159   VLDL 31 11/05/2008 0159   LDLCALC  11/05/2008 0159    28        Total Cholesterol/HDL:CHD Risk Coronary Heart Disease Risk Table                     Men   Women  1/2  Average Risk   3.4   3.3  Average Risk       5.0   4.4  2 X Average Risk   9.6   7.1  3 X Average Risk  23.4   11.0        Use the calculated Patient Ratio above and the CHD Risk Table to determine the patient's CHD Risk.        ATP III CLASSIFICATION (LDL):  <100     mg/dL   Optimal  100-129  mg/dL   Near or Above                    Optimal  130-159  mg/dL   Borderline  160-189  mg/dL   High  >190     mg/dL   Very High      Wt Readings from Last 3 Encounters:  07/01/14 80.65 kg (177 lb 12.8 oz)      Other studies Reviewed: Additional studies/ records that were reviewed today include: . Review of the above records demonstrates: Reviewed recent [facility] records.   ASSESSMENT AND PLAN:  CAD in native artery - table anginal pattern  Type II diabetes mellitus with complication, uncontrolled  Essential hypertension - controlled  Hyperlipidemia - followed by primary care  Chronic ischemic vertebrobasilar artery thalamic stroke     Current medicines are reviewed at length with the patient today.  The patient does not have concerns regarding medicines.  The following changes have been made:  Will discontinue Plavix. He is now six years post his last stent implantation.  Labs/ tests ordered today include:  No orders of the defined types were placed in this encounter.     Disposition:   FU with HS in 1 year  Signed, Sinclair Grooms, MD  07/01/2014 10:23 AM    [address], [place], [place]  [address] Phone: [phone]; Fax: [phone]

## 2014-07-01 NOTE — Patient Instructions (Signed)
Medication Instructions:  Your physician has recommended you make the following change in your medication:  STOP Plavix   Labwork: None   Testing/Procedures: None   Follow-Up: Your physician wants you to follow-up in: 1 year with Dr.Smith You will receive a reminder letter in the mail two months in advance. If you don't receive a letter, please call our office to schedule the follow-up appointment.   Any Other Special Instructions Will Be Listed Below (If Applicable).

## 2014-07-05 DIAGNOSIS — D122 Benign neoplasm of ascending colon: Secondary | ICD-10-CM | POA: Diagnosis not present

## 2014-07-05 DIAGNOSIS — Z86010 Personal history of colonic polyps: Secondary | ICD-10-CM | POA: Diagnosis not present

## 2014-07-05 DIAGNOSIS — D126 Benign neoplasm of colon, unspecified: Secondary | ICD-10-CM | POA: Diagnosis not present

## 2014-07-05 DIAGNOSIS — Z09 Encounter for follow-up examination after completed treatment for conditions other than malignant neoplasm: Secondary | ICD-10-CM | POA: Diagnosis not present

## 2014-07-12 DIAGNOSIS — E11359 Type 2 diabetes mellitus with proliferative diabetic retinopathy without macular edema: Secondary | ICD-10-CM | POA: Diagnosis not present

## 2014-07-14 ENCOUNTER — Encounter: Payer: Self-pay | Admitting: Interventional Cardiology

## 2014-07-19 DIAGNOSIS — E11359 Type 2 diabetes mellitus with proliferative diabetic retinopathy without macular edema: Secondary | ICD-10-CM | POA: Diagnosis not present

## 2014-08-16 DIAGNOSIS — H4311 Vitreous hemorrhage, right eye: Secondary | ICD-10-CM | POA: Diagnosis not present

## 2014-08-16 DIAGNOSIS — E11359 Type 2 diabetes mellitus with proliferative diabetic retinopathy without macular edema: Secondary | ICD-10-CM | POA: Diagnosis not present

## 2014-09-27 DIAGNOSIS — E782 Mixed hyperlipidemia: Secondary | ICD-10-CM | POA: Diagnosis not present

## 2014-09-27 DIAGNOSIS — Z794 Long term (current) use of insulin: Secondary | ICD-10-CM | POA: Diagnosis not present

## 2014-09-27 DIAGNOSIS — E11319 Type 2 diabetes mellitus with unspecified diabetic retinopathy without macular edema: Secondary | ICD-10-CM | POA: Diagnosis not present

## 2014-09-27 DIAGNOSIS — E1121 Type 2 diabetes mellitus with diabetic nephropathy: Secondary | ICD-10-CM | POA: Diagnosis not present

## 2014-09-27 DIAGNOSIS — M7580 Other shoulder lesions, unspecified shoulder: Secondary | ICD-10-CM | POA: Diagnosis not present

## 2014-09-27 DIAGNOSIS — N183 Chronic kidney disease, stage 3 (moderate): Secondary | ICD-10-CM | POA: Diagnosis not present

## 2014-09-27 DIAGNOSIS — Z23 Encounter for immunization: Secondary | ICD-10-CM | POA: Diagnosis not present

## 2014-09-27 DIAGNOSIS — I1310 Hypertensive heart and chronic kidney disease without heart failure, with stage 1 through stage 4 chronic kidney disease, or unspecified chronic kidney disease: Secondary | ICD-10-CM | POA: Diagnosis not present

## 2014-10-13 ENCOUNTER — Encounter: Payer: Self-pay | Admitting: Internal Medicine

## 2014-11-07 DIAGNOSIS — M7052 Other bursitis of knee, left knee: Secondary | ICD-10-CM | POA: Diagnosis not present

## 2014-11-15 DIAGNOSIS — M25521 Pain in right elbow: Secondary | ICD-10-CM | POA: Diagnosis not present

## 2014-11-15 DIAGNOSIS — M25562 Pain in left knee: Secondary | ICD-10-CM | POA: Diagnosis not present

## 2015-02-06 ENCOUNTER — Encounter: Payer: Self-pay | Admitting: Interventional Cardiology

## 2015-02-06 DIAGNOSIS — N183 Chronic kidney disease, stage 3 (moderate): Secondary | ICD-10-CM | POA: Diagnosis not present

## 2015-02-06 DIAGNOSIS — E11319 Type 2 diabetes mellitus with unspecified diabetic retinopathy without macular edema: Secondary | ICD-10-CM | POA: Diagnosis not present

## 2015-02-06 DIAGNOSIS — E782 Mixed hyperlipidemia: Secondary | ICD-10-CM | POA: Diagnosis not present

## 2015-02-06 DIAGNOSIS — I2510 Atherosclerotic heart disease of native coronary artery without angina pectoris: Secondary | ICD-10-CM | POA: Diagnosis not present

## 2015-02-06 DIAGNOSIS — Z125 Encounter for screening for malignant neoplasm of prostate: Secondary | ICD-10-CM | POA: Diagnosis not present

## 2015-02-06 DIAGNOSIS — I1310 Hypertensive heart and chronic kidney disease without heart failure, with stage 1 through stage 4 chronic kidney disease, or unspecified chronic kidney disease: Secondary | ICD-10-CM | POA: Diagnosis not present

## 2015-02-06 DIAGNOSIS — Z0001 Encounter for general adult medical examination with abnormal findings: Secondary | ICD-10-CM | POA: Diagnosis not present

## 2015-02-06 DIAGNOSIS — E1121 Type 2 diabetes mellitus with diabetic nephropathy: Secondary | ICD-10-CM | POA: Diagnosis not present

## 2015-02-06 DIAGNOSIS — I340 Nonrheumatic mitral (valve) insufficiency: Secondary | ICD-10-CM | POA: Diagnosis not present

## 2015-02-06 DIAGNOSIS — Z8673 Personal history of transient ischemic attack (TIA), and cerebral infarction without residual deficits: Secondary | ICD-10-CM | POA: Diagnosis not present

## 2015-02-07 ENCOUNTER — Ambulatory Visit (INDEPENDENT_AMBULATORY_CARE_PROVIDER_SITE_OTHER): Payer: Commercial Managed Care - HMO | Admitting: Endocrinology

## 2015-02-07 ENCOUNTER — Encounter: Payer: Self-pay | Admitting: Endocrinology

## 2015-02-07 VITALS — BP 138/82 | HR 71 | Temp 97.8°F | Ht 70.0 in | Wt 183.0 lb

## 2015-02-07 DIAGNOSIS — E1159 Type 2 diabetes mellitus with other circulatory complications: Secondary | ICD-10-CM | POA: Diagnosis not present

## 2015-02-07 NOTE — Patient Instructions (Signed)
good diet and exercise significantly improve the control of your diabetes.  please let me know if you wish to be referred to a dietician.  high blood sugar is very risky to your health.  you should see an eye doctor and dentist every year.  It is very important to get all recommended vaccinations.  controlling your blood pressure and cholesterol drastically reduces the damage diabetes does to your body.  Those who smoke should quit.  please discuss these with your doctor.  check your blood sugar twice a day.  vary the time of day when you check, between before the 3 meals, and at bedtime.  also check if you have symptoms of your blood sugar being too high or too low.  please keep a record of the readings and bring it to your next appointment here (or you can bring the meter itself).  You can write it on any piece of paper.  please call us sooner if your blood sugar goes below 70, or if you have a lot of readings over 200. For now, take 1 click with any smaller meal (or snack), and 2 clicks with any larger meal.  Please come back for a follow-up appointment in 2 weeks.

## 2015-02-07 NOTE — Progress Notes (Signed)
Subjective:    Patient ID: Joseph Sanford, male    DOB: November 14, 1945, 70 y.o.   MRN: [id number]  HPI pt states DM was dx'ed in 1992; he has mild neuropathy of the lower extremities; he has associated CAD, retinopathy, TIA, and renal insufficiency; he has been on insulin since 2000; pt says his diet is not good, but exercise is only fair; he has never had pancreatitis, severe hypoglycemia or DKA.  He uses a V-GO-20.  He takes 0-2 "clicks" per day (1 click is 2 units).  He has hypoglycemia approx once per month (happens fasting).  He stopped metformin, due to diarrhea.  Past Medical History  Diagnosis Date  . Coronary artery disease   . Diabetes mellitus without complication ([place])   . Hyperlipidemia   . ED (erectile dysfunction)   . CVA (cerebral infarction)   . Chronic kidney disease     Past Surgical History  Procedure Laterality Date  . Coronary artery bypass graft      Social History   Social History  . Marital Status: Married    Spouse Name: N/A  . Number of Children: N/A  . Years of Education: N/A   Occupational History  . Not on file.   Social History Main Topics  . Smoking status: Never Smoker   . Smokeless tobacco: Not on file  . Alcohol Use: Not on file  . Drug Use: Not on file  . Sexual Activity: Not on file   Other Topics Concern  . Not on file   Social History Narrative    Current Outpatient Prescriptions on File Prior to Visit  Medication Sig Dispense Refill  . aspirin 81 MG tablet Take 81 mg by mouth daily.    Marland Kitchen b complex vitamins tablet Take 1 tablet by mouth daily.    . Calcium Carbonate-Vitamin D (CALCIUM-D) 600-400 MG-UNIT TABS Take by mouth.    . Cholecalciferol (CVS D3) 2000 UNITS CAPS Take 1 capsule by mouth daily.     . folic acid (FOLVITE) A999333 MCG tablet Take 400 mcg by mouth daily.    . metoprolol succinate (TOPROL-XL) 50 MG 24 hr tablet     . nitroGLYCERIN (NITROSTAT) 0.4 MG SL tablet Place 0.4 mg under the tongue every 5 (five) minutes as  needed for chest pain.    Marland Kitchen NOVOLOG 100 UNIT/ML injection Inject 58 Units into the skin every evening.     . Omega-3 Fatty Acids (FISH OIL) 1200 MG CAPS Take 1 capsule by mouth every morning.     . Zinc 50 MG CAPS Take 50 mg by mouth daily.      No current facility-administered medications on file prior to visit.    Allergies  Allergen Reactions  . Lipitor [Atorvastatin] Other (See Comments)    Brain fog  . Iodine Nausea And Vomiting  . Lisinopril Other (See Comments)    cough  . Shellfish Allergy Nausea Only and Other (See Comments)    Family History  Problem Relation Age of Onset  . Diabetes Brother     BP 138/82 mmHg  Pulse 71  Temp(Src) 97.8 F (36.6 C) (Oral)  Ht 5\' 10"  (1.778 m)  Wt 183 lb (83.008 kg)  BMI 26.26 kg/m2  SpO2 97%   Review of Systems denies blurry vision, headache, chest pain, sob, n/v, urinary frequency, muscle cramps, excessive diaphoresis, depression, cold intolerance, and rhinorrhea.  He has lost 6 lbs over the past 3 weeks, due to his efforts.  He has easy  bruising.     Objective:   Physical Exam VS: see vs page GEN: no distress HEAD: head: no deformity eyes: no periorbital swelling, no proptosis external nose and ears are normal mouth: no lesion seen NECK: supple, thyroid is not enlarged CHEST WALL: no deformity LUNGS: clear to auscultation BREASTS:  No gynecomastia CV: reg rate and rhythm, no murmur ABD: abdomen is soft, nontender.  no hepatosplenomegaly.  not distended.  no hernia.   MUSCULOSKELETAL: muscle bulk and strength are grossly normal.  no obvious joint swelling.  gait is normal and steady EXTEMITIES: no deformity.  no ulcer on the feet.  feet are of normal color and temp.  no edema PULSES: dorsalis pedis intact bilat.  no carotid bruit NEURO:  cn 2-12 grossly intact.   readily moves all 4's.  sensation is intact to touch on the feet SKIN:  Normal texture and temperature.  No rash or suspicious lesion is visible.   NODES:   None palpable at the neck PSYCH: alert, well-oriented.  Does not appear anxious nor depressed.  outside test results are reviewed: A1c=8.6%  I have reviewed outside records, and summarized: Pt was noted to have elevated a1c, and referred here.  i personally reviewed electrocardiogram tracing (07/01/14):  Indication: CAD Impression: SB, 95 old IMI    Assessment & Plan:  DM: new to me.  He needs increased rx   Patient is advised the following: Patient Instructions  good diet and exercise significantly improve the control of your diabetes.  please let me know if you wish to be referred to a dietician.  high blood sugar is very risky to your health.  you should see an eye doctor and dentist every year.  It is very important to get all recommended vaccinations.  controlling your blood pressure and cholesterol drastically reduces the damage diabetes does to your body.  Those who smoke should quit.  please discuss these with your doctor.  check your blood sugar twice a day.  vary the time of day when you check, between before the 3 meals, and at bedtime.  also check if you have symptoms of your blood sugar being too high or too low.  please keep a record of the readings and bring it to your next appointment here (or you can bring the meter itself).  You can write it on any piece of paper.  please call us sooner if your blood sugar goes below 70, or if you have a lot of readings over 200. For now, take 1 click with any smaller meal (or snack), and 2 clicks with any larger meal.  Please come back for a follow-up appointment in 2 weeks.

## 2015-02-28 ENCOUNTER — Ambulatory Visit: Payer: Commercial Managed Care - HMO | Admitting: Endocrinology

## 2015-03-02 ENCOUNTER — Telehealth: Payer: Self-pay | Admitting: Endocrinology

## 2015-03-02 NOTE — Telephone Encounter (Signed)
Pt called about [employer] being too high, transferred to [facility]

## 2015-03-03 ENCOUNTER — Telehealth: Payer: Self-pay

## 2015-03-03 NOTE — Telephone Encounter (Signed)
Pt advised of note below and scheduled his appointment for 03/06/2014.

## 2015-03-03 NOTE — Telephone Encounter (Signed)
Please move up appt to next week

## 2015-03-03 NOTE — Telephone Encounter (Signed)
Pt called stating his V-go pump is 300.00$ and he cannot afford this. Pt wanted to know if he could continue taking Novolog 58 units daily by way of the insulin syringe or if his dosage should be updated? Please advise, Thanks!

## 2015-03-07 ENCOUNTER — Ambulatory Visit (INDEPENDENT_AMBULATORY_CARE_PROVIDER_SITE_OTHER): Payer: Commercial Managed Care - HMO | Admitting: Endocrinology

## 2015-03-07 ENCOUNTER — Encounter: Payer: Self-pay | Admitting: Endocrinology

## 2015-03-07 VITALS — BP 148/90 | HR 65 | Temp 97.9°F | Ht 70.0 in | Wt 182.0 lb

## 2015-03-07 DIAGNOSIS — N183 Chronic kidney disease, stage 3 unspecified: Secondary | ICD-10-CM

## 2015-03-07 DIAGNOSIS — Z794 Long term (current) use of insulin: Secondary | ICD-10-CM

## 2015-03-07 DIAGNOSIS — E1122 Type 2 diabetes mellitus with diabetic chronic kidney disease: Secondary | ICD-10-CM

## 2015-03-07 NOTE — Patient Instructions (Addendum)
check your blood sugar twice a day.  vary the time of day when you check, between before the 3 meals, and at bedtime.  also check if you have symptoms of your blood sugar being too high or too low.  please keep a record of the readings and bring it to your next appointment here (or you can bring the meter itself).  You can write it on any piece of paper.  please call us sooner if your blood sugar goes below 70, or if you have a lot of readings over 200. Our office is checking to see if you can get the V-GO at a better cost.  We'll let you know.   Until then, take novolog 3 times a day (just before each meal) 10-24-23 units.  Please come back for a follow-up appointment in 2 weeks.

## 2015-03-07 NOTE — Progress Notes (Signed)
Subjective:    Patient ID: Joseph Sanford, male    DOB: 1945/12/19, 70 y.o.   MRN: [id number]  HPI Pt returns for f/u of diabetes mellitus: DM type: Insulin-requiring type 2 Dx'ed: 2376 Complications: polyneuropathy, CAD, retinopathy, TIA, and renal insufficiency Therapy: insulin since 2000 DKA: never Severe hypoglycemia: never Pancreatitis: never Other: he uses a V-GO-20; he stopped metformin, due to diarrhea. Interval history: He has stopped using his V-GO, due to cost.  He has been injecting the novolog with meals.  Meter is downloaded today, and the printout is scanned into the record.  It varies from 130-275.  There is no trend throughout the day. Past Medical History  Diagnosis Date  . Coronary artery disease   . Diabetes mellitus without complication ([place])   . Hyperlipidemia   . ED (erectile dysfunction)   . CVA (cerebral infarction)   . Chronic kidney disease     Past Surgical History  Procedure Laterality Date  . Coronary artery bypass graft      Social History   Social History  . Marital Status: Married    Spouse Name: N/A  . Number of Children: N/A  . Years of Education: N/A   Occupational History  . Not on file.   Social History Main Topics  . Smoking status: Never Smoker   . Smokeless tobacco: Not on file  . Alcohol Use: Not on file  . Drug Use: Not on file  . Sexual Activity: Not on file   Other Topics Concern  . Not on file   Social History Narrative    Current Outpatient Prescriptions on File Prior to Visit  Medication Sig Dispense Refill  . aspirin 81 MG tablet Take 81 mg by mouth daily.    Marland Kitchen b complex vitamins tablet Take 1 tablet by mouth daily.    . Calcium Carbonate-Vitamin D (CALCIUM-D) 600-400 MG-UNIT TABS Take by mouth.    . Cholecalciferol (CVS D3) 2000 UNITS CAPS Take 1 capsule by mouth daily.     . folic acid (FOLVITE) 283 MCG tablet Take 400 mcg by mouth daily.    . Insulin Disposable Pump (V-GO 20) KIT by Does not apply route.     . metoprolol succinate (TOPROL-XL) 50 MG 24 hr tablet     . nitroGLYCERIN (NITROSTAT) 0.4 MG SL tablet Place 0.4 mg under the tongue every 5 (five) minutes as needed for chest pain.    Marland Kitchen NOVOLOG 100 UNIT/ML injection 3 times a day (just before each meal) 10-24-23 units.    . Omega-3 Fatty Acids (FISH OIL) 1200 MG CAPS Take 1 capsule by mouth every morning.     . simvastatin (ZOCOR) 40 MG tablet Take 40 mg by mouth daily.    . Zinc 50 MG CAPS Take 50 mg by mouth daily.      No current facility-administered medications on file prior to visit.    Allergies  Allergen Reactions  . Lipitor [Atorvastatin] Other (See Comments)    Brain fog  . Iodine Nausea And Vomiting  . Lisinopril Other (See Comments)    cough  . Shellfish Allergy Nausea Only and Other (See Comments)    Family History  Problem Relation Age of Onset  . Diabetes Brother     BP 148/90 mmHg  Pulse 65  Temp(Src) 97.9 F (36.6 C) (Oral)  Ht 5' 10"  (1.778 m)  Wt 182 lb (82.555 kg)  BMI 26.11 kg/m2  SpO2 98%   Review of Systems He denies hypoglycemia.  Objective:   Physical Exam VITAL SIGNS:  See vs page GENERAL: no distress SKIN:  Insulin injection sites at the anterior abdomen are normal.        Assessment & Plan:  DM: glycemic control is worse off the V-GO device.  Patient is advised the following: Patient Instructions  check your blood sugar twice a day.  vary the time of day when you check, between before the 3 meals, and at bedtime.  also check if you have symptoms of your blood sugar being too high or too low.  please keep a record of the readings and bring it to your next appointment here (or you can bring the meter itself).  You can write it on any piece of paper.  please call us sooner if your blood sugar goes below 70, or if you have a lot of readings over 200. Our office is checking to see if you can get the V-GO at a better cost.  We'll let you know.   Until then, take novolog 3 times a day  (just before each meal) 10-24-23 units.  Please come back for a follow-up appointment in 2 weeks.

## 2015-03-15 ENCOUNTER — Telehealth: Payer: Self-pay | Admitting: Endocrinology

## 2015-03-15 MED ORDER — NOVOLOG 100 UNIT/ML ~~LOC~~ SOLN: SUBCUTANEOUS | Status: DC

## 2015-03-15 NOTE — Telephone Encounter (Signed)
See note below and please advise. I have sent the rx for novolog.  Thanks!

## 2015-03-15 NOTE — Telephone Encounter (Signed)
Pt needs [facility] on [address] to receive a rx for diabetes meds novolog  Please advise what he is to do he has been out for 2 wks

## 2015-03-15 NOTE — Telephone Encounter (Signed)
He have prn refills

## 2015-03-15 NOTE — Telephone Encounter (Signed)
I contacted the pt and left a voicemail advising the rx has been sent and to start taking the medication as soon as he can.

## 2015-03-21 ENCOUNTER — Encounter: Payer: Self-pay | Admitting: Endocrinology

## 2015-03-21 ENCOUNTER — Ambulatory Visit (INDEPENDENT_AMBULATORY_CARE_PROVIDER_SITE_OTHER): Payer: Commercial Managed Care - HMO | Admitting: Endocrinology

## 2015-03-21 ENCOUNTER — Ambulatory Visit: Payer: Commercial Managed Care - HMO | Admitting: Endocrinology

## 2015-03-21 VITALS — BP 112/78 | HR 61 | Temp 97.7°F | Ht 70.0 in | Wt 182.0 lb

## 2015-03-21 DIAGNOSIS — N183 Chronic kidney disease, stage 3 unspecified: Secondary | ICD-10-CM

## 2015-03-21 DIAGNOSIS — Z794 Long term (current) use of insulin: Secondary | ICD-10-CM

## 2015-03-21 DIAGNOSIS — E1122 Type 2 diabetes mellitus with diabetic chronic kidney disease: Secondary | ICD-10-CM | POA: Diagnosis not present

## 2015-03-21 LAB — POCT GLYCOSYLATED HEMOGLOBIN (HGB A1C): HEMOGLOBIN A1C: 8.5

## 2015-03-21 MED ORDER — INSULIN ASPART 100 UNIT/ML ~~LOC~~ SOLN: SUBCUTANEOUS | Status: DC

## 2015-03-21 NOTE — Progress Notes (Signed)
Subjective:    Patient ID: Joseph Sanford, male    DOB: August 14, 1945, 70 y.o.   MRN: [id number]  HPI Pt returns for f/u of diabetes mellitus: DM type: Insulin-requiring type 2 Dx'ed: 0000000 Complications: polyneuropathy, CAD, retinopathy, TIA, and renal insufficiency Therapy: insulin since 2000 DKA: never Severe hypoglycemia: never Pancreatitis: never Other: he stopped using V-GO, due to cost; he stopped metformin, due to diarrhea.  The pattern of cbg's indicates he does not need basal insulin. Interval history: no cbg record, but states cbg's vary from 40-200's.  It is in general higher as the day goes on.  However, he says am cbg depends heavily on size of evening meal.   Past Medical History  Diagnosis Date  . Coronary artery disease   . Diabetes mellitus without complication ([place])   . Hyperlipidemia   . ED (erectile dysfunction)   . CVA (cerebral infarction)   . Chronic kidney disease     Past Surgical History  Procedure Laterality Date  . Coronary artery bypass graft      Social History   Social History  . Marital Status: Married    Spouse Name: N/A  . Number of Children: N/A  . Years of Education: N/A   Occupational History  . Not on file.   Social History Main Topics  . Smoking status: Never Smoker   . Smokeless tobacco: Not on file  . Alcohol Use: Not on file  . Drug Use: Not on file  . Sexual Activity: Not on file   Other Topics Concern  . Not on file   Social History Narrative    Current Outpatient Prescriptions on File Prior to Visit  Medication Sig Dispense Refill  . aspirin 81 MG tablet Take 81 mg by mouth daily.    Marland Kitchen b complex vitamins tablet Take 1 tablet by mouth daily.    . Calcium Carbonate-Vitamin D (CALCIUM-D) 600-400 MG-UNIT TABS Take by mouth.    . Cholecalciferol (CVS D3) 2000 UNITS CAPS Take 1 capsule by mouth daily.     . folic acid (FOLVITE) A999333 MCG tablet Take 400 mcg by mouth daily.    . metoprolol succinate (TOPROL-XL) 50 MG 24 hr  tablet     . nitroGLYCERIN (NITROSTAT) 0.4 MG SL tablet Place 0.4 mg under the tongue every 5 (five) minutes as needed for chest pain.    . Omega-3 Fatty Acids (FISH OIL) 1200 MG CAPS Take 1 capsule by mouth every morning.     . simvastatin (ZOCOR) 40 MG tablet Take 40 mg by mouth daily.    . Zinc 50 MG CAPS Take 50 mg by mouth daily.      No current facility-administered medications on file prior to visit.    Allergies  Allergen Reactions  . Lipitor [Atorvastatin] Other (See Comments)    Brain fog  . Iodine Nausea And Vomiting  . Lisinopril Other (See Comments)    cough  . Shellfish Allergy Nausea Only and Other (See Comments)    Family History  Problem Relation Age of Onset  . Diabetes Brother     BP 112/78 mmHg  Pulse 61  Temp(Src) 97.7 F (36.5 C) (Oral)  Ht 5\' 10"  (1.778 m)  Wt 182 lb (82.555 kg)  BMI 26.11 kg/m2  SpO2 97%  Review of Systems Denies LOC    Objective:   Physical Exam VITAL SIGNS:  See vs page GENERAL: no distress Pulses: dorsalis pedis intact bilat.   MSK: no deformity of the feet  CV: no leg edema Skin:  no ulcer on the feet.  normal color and temp on the feet. Neuro: sensation is intact to touch on the feet    A1c=8.5%    Assessment & Plan:  DM: he needs increased rx.   Patient is advised the following: Patient Instructions  Please increase the insulin to 3 times a day (just before each meal) 12-25-28 units.   check your blood sugar twice a day.  vary the time of day when you check, between before the 3 meals, and at bedtime.  also check if you have symptoms of your blood sugar being too high or too low.  please keep a record of the readings and bring it to your next appointment here (or you can bring the meter itself).  You can write it on any piece of paper.  please call us sooner if your blood sugar goes below 70, or if you have a lot of readings over 200.  Please come back for a follow-up appointment in 2 months.

## 2015-03-21 NOTE — Patient Instructions (Signed)
Please increase the insulin to 3 times a day (just before each meal) 12-25-28 units.   check your blood sugar twice a day.  vary the time of day when you check, between before the 3 meals, and at bedtime.  also check if you have symptoms of your blood sugar being too high or too low.  please keep a record of the readings and bring it to your next appointment here (or you can bring the meter itself).  You can write it on any piece of paper.  please call us sooner if your blood sugar goes below 70, or if you have a lot of readings over 200.  Please come back for a follow-up appointment in 2 months.

## 2015-03-22 DIAGNOSIS — E119 Type 2 diabetes mellitus without complications: Secondary | ICD-10-CM | POA: Insufficient documentation

## 2015-05-09 DIAGNOSIS — E1121 Type 2 diabetes mellitus with diabetic nephropathy: Secondary | ICD-10-CM | POA: Diagnosis not present

## 2015-05-09 DIAGNOSIS — I2510 Atherosclerotic heart disease of native coronary artery without angina pectoris: Secondary | ICD-10-CM | POA: Diagnosis not present

## 2015-05-09 DIAGNOSIS — Z794 Long term (current) use of insulin: Secondary | ICD-10-CM | POA: Diagnosis not present

## 2015-05-09 DIAGNOSIS — N183 Chronic kidney disease, stage 3 (moderate): Secondary | ICD-10-CM | POA: Diagnosis not present

## 2015-05-09 DIAGNOSIS — R739 Hyperglycemia, unspecified: Secondary | ICD-10-CM | POA: Diagnosis not present

## 2015-05-09 DIAGNOSIS — E782 Mixed hyperlipidemia: Secondary | ICD-10-CM | POA: Diagnosis not present

## 2015-05-09 DIAGNOSIS — I1310 Hypertensive heart and chronic kidney disease without heart failure, with stage 1 through stage 4 chronic kidney disease, or unspecified chronic kidney disease: Secondary | ICD-10-CM | POA: Diagnosis not present

## 2015-05-10 ENCOUNTER — Encounter: Payer: Self-pay | Admitting: Interventional Cardiology

## 2015-05-18 ENCOUNTER — Telehealth: Payer: Self-pay | Admitting: *Deleted

## 2015-05-18 NOTE — Telephone Encounter (Signed)
Called to give pt lab results. While speaking with him he stated that approximately 3 days a week he wakes up and he will have a burning feeling in his chest. He states occasionally he will take a nitro and this resolves and on other occasions he just rests and it goes away. Denies pain, SOB, lightheadedness or dizziness. Pt did not have vitals available but states that BP and HR are normally fine. Pt states once sensation resolves it does not occur again the same day. Advised pt to continue to monitor this and let us know if this is occuring more often or not resolving with nitro or rest. Advised pt that I would also send message to Dr. Tamala Julian to make him aware and someone would call back with any recommendations if he has any. Pt verbalized understanding and was in agreement with this plan.

## 2015-05-18 NOTE — Telephone Encounter (Signed)
-----   Message from Belva Crome, MD sent at 05/10/2015 12:44 PM EDT ----- Lab reviewed. No significant or acute abnormalities are noted.

## 2015-05-20 NOTE — Telephone Encounter (Signed)
He needs to see APP and get set for coronary angio and possible PCI with me.

## 2015-05-22 NOTE — Telephone Encounter (Signed)
Pt states he is in [facility] right now and will not be back until Friday.

## 2015-05-22 NOTE — Telephone Encounter (Signed)
Pt notified appt scheduled with Richardson Dopp 06/08/15 at 8:50AM [employer]. Pt aware to call if symptoms change before appt.

## 2015-05-22 NOTE — Telephone Encounter (Signed)
Pt advised I will call him back with a follow up appointment to get set up for coronary angio and possible PCI with Dr Tamala Julian.  The first available appt I find with APP at [facility] is 06/08/15 with Richardson Dopp.   I reviewed with Dr Tamala Julian, okay to schedule appt for 06/08/15, advise pt to call if change in symptoms.

## 2015-05-22 NOTE — Telephone Encounter (Signed)
Joseph Sanford can see today at 2:40PM.  I attempted to contact pt, got voice mail and recorded message that his mailbox was full and unable to leave a message.

## 2015-05-30 ENCOUNTER — Ambulatory Visit: Payer: Commercial Managed Care - HMO | Admitting: Endocrinology

## 2015-06-07 NOTE — Progress Notes (Signed)
Cardiology Office Note:    Date:  06/08/2015   ID:  Joseph Sanford, DOB [birth year], MRN [id number]  PCP:  Mayra Neer, MD  Cardiologist:  Dr. Daneen Schick   Electrophysiologist:  n/a  Referring MD: Mayra Neer, MD   Chief Complaint  Patient presents with  . Chest Pain    History of Present Illness:     Joseph Sanford is a 70 y.o. male with a hx of CAD status post prior PCI, prior CVA, HTN, HL, CKD related to diabetes. Last PCI in 8/10 with DES to the PDA. Seen by Dr. Tamala Julian 6/16.   The patient recently called in with complaints of chest discomfort relieved by nitroglycerin. After review with Dr. Tamala Julian, he recommended the patient come in for an appointment to be set up for cardiac catheterization.   He is here alone.  His wife passed away in 05-04-10.  He notes exertional chest burning with exertion for the past 1 month. Symptoms do get better with rest. He does have some relief with nitroglycerin. Denies rest symptoms. Denies dyspnea. Denies syncope. Denies orthopnea, PND or edema. He does have some symptoms with eating spicy foods.     Past Medical History  Diagnosis Date  . Coronary artery disease   . Diabetes mellitus without complication ([place])   . Hyperlipidemia   . ED (erectile dysfunction)   . CVA (cerebral infarction)   . Chronic kidney disease     No past surgical history on file.  Current Medications: Outpatient Prescriptions Prior to Visit  Medication Sig Dispense Refill  . aspirin 81 MG tablet Take 81 mg by mouth daily.    Marland Kitchen b complex vitamins tablet Take 1 tablet by mouth daily.    . Calcium Carbonate-Vitamin D (CALCIUM-D) 600-400 MG-UNIT TABS Take by mouth.    . Cholecalciferol (CVS D3) 2000 UNITS CAPS Take 1 capsule by mouth daily.     . folic acid (FOLVITE) A999333 MCG tablet Take 400 mcg by mouth daily.    . insulin aspart (NOVOLOG) 100 UNIT/ML injection 3 times a day (just before each meal) 12-25-28 units. 20 mL 3  . metoprolol succinate (TOPROL-XL) 50 MG 24  hr tablet Take 50 mg by mouth daily.     . Omega-3 Fatty Acids (FISH OIL) 1200 MG CAPS Take 1 capsule by mouth every morning.     . simvastatin (ZOCOR) 40 MG tablet Take 40 mg by mouth daily.    . Zinc 50 MG CAPS Take 50 mg by mouth daily.     . nitroGLYCERIN (NITROSTAT) 0.4 MG SL tablet Place 0.4 mg under the tongue every 5 (five) minutes as needed for chest pain.     No facility-administered medications prior to visit.      Allergies:   Lipitor; Iodine; Lisinopril; and Shellfish allergy   Social History   Social History  . Marital Status: Married    Spouse Name: N/A  . Number of Children: N/A  . Years of Education: N/A   Social History Main Topics  . Smoking status: Never Smoker   . Smokeless tobacco: None  . Alcohol Use: None  . Drug Use: None  . Sexual Activity: Not Asked   Other Topics Concern  . None   Social History Narrative     Family History:  The patient's family history includes Diabetes in his brother.   ROS:   Please see the history of present illness.    Review of Systems  Constitution: Positive for decreased appetite and  weight loss. Negative for fever and night sweats.  HENT: Positive for hearing loss.   Cardiovascular: Positive for chest pain.  Hematologic/Lymphatic: Bruises/bleeds easily.  Musculoskeletal: Positive for joint swelling.  Gastrointestinal: Positive for diarrhea.       Dark stool diarrhea  Genitourinary: Positive for incomplete emptying.   All other systems reviewed and are negative.   Physical Exam:    VS:  BP 160/70 mmHg  Pulse 58  Ht 5\' 10"  (1.778 m)  Wt 179 lb 12.8 oz (81.557 kg)  BMI 25.80 kg/m2   GEN: Well nourished, well developed, in no acute distress HEENT: normal Neck: no JVD, no masses Cardiac: Normal S1/S2, RRR; no murmurs, rubs, or gallops, no edema;  no carotid bruits,   Respiratory:  clear to auscultation bilaterally; no wheezing, rhonchi or rales GI: soft, nontender, nondistended MS: no deformity or  atrophy Skin: warm and dry Neuro: No focal deficits  Psych: Alert and oriented x 3, normal affect  Wt Readings from Last 3 Encounters:  06/08/15 179 lb 12.8 oz (81.557 kg)  03/21/15 182 lb (82.555 kg)  03/07/15 182 lb (82.555 kg)      Studies/Labs Reviewed:     EKG:  EKG is  ordered today.  The ekg ordered today demonstrates NSR, HR 57, normal axis, TWI in 3, QTc 404 ms  Recent Labs: No results found for requested labs within last 365 days.  Labs with PCP 05/09/15: Creatinine 1.18, potassium 4.0, ALT 17, LDL 128  Recent Lipid Panel    Component Value Date/Time   CHOL  11/05/2008 0159    91        ATP III CLASSIFICATION:  <200     mg/dL   Desirable  200-239  mg/dL   Borderline High  >=240    mg/dL   High          TRIG 156* 11/05/2008 0159   HDL 32* 11/05/2008 0159   CHOLHDL 2.8 11/05/2008 0159   VLDL 31 11/05/2008 0159   LDLCALC  11/05/2008 0159    28        Total Cholesterol/HDL:CHD Risk Coronary Heart Disease Risk Table                     Men   Women  1/2 Average Risk   3.4   3.3  Average Risk       5.0   4.4  2 X Average Risk   9.6   7.1  3 X Average Risk  23.4   11.0        Use the calculated Patient Ratio above and the CHD Risk Table to determine the patient's CHD Risk.        ATP III CLASSIFICATION (LDL):  <100     mg/dL   Optimal  100-129  mg/dL   Near or Above                    Optimal  130-159  mg/dL   Borderline  160-189  mg/dL   High  >190     mg/dL   Very High    Additional studies/ records that were reviewed today include:   Echo 8/13 EF 60-65%, moderate concentric LVH, mild aortic root dilatation (3.9 cm), trace MR, trace TR, grade 2 diastolic dysfunction  Carotid US 6/13 No hemodynamically significant ICA stenosis  LHC 8/10 LM patent LAD mid stent patent with mid and proximal LAD 50%, D2 jailed by stent with ostial 60% LCx  proximal 50% RI 70% at bifurcation RCA mid 50%, distal 40%, patent stent before bifurcation ([place]yp[place]), PDA with  high-grade 95% proximal focal ISR, 70-80% EF 50% PCI: 8 at 2.5 mm Xience DES to the PDA   ASSESSMENT:     1. Coronary artery disease involving native coronary artery of native heart with angina pectoris ([place])   2. Essential hypertension   3. Hyperlipidemia   4. History of CVA (cerebrovascular accident)   5. Type 2 diabetes mellitus with stage 3 chronic kidney disease, with long-term current use of insulin (HCC)     PLAN:     In order of problems listed above:  1. CAD - He presents today with complaints of exertional chest discomfort. Symptoms sound consistent with CCS class 2-3 angina. I reviewed his case today with Dr. Johnsie Cancel (DOD) as well as his cardiologist, Dr. Tamala Julian. Plan is to proceed with cardiac catheterization.  Risks and benefits of cardiac catheterization have been discussed with the patient.  These include bleeding, infection, kidney damage, stroke, heart attack, death.  The patient understands these risks and is willing to proceed. He does have a history of using PDE-5 inhibitors. Continue ASA, statin.  Add Norvasc 2.5 mg QD.  Arrange LHC with Dr. Daneen Schick. Patient does have a history of shellfish allergy. He tells me that this only causes nausea. He does not note any prior history of reaction to contrast.  2. HTN - BP uncontrolled.  Add norvasc. Start Losartan 25 mg QD given CKD.  BMET 1 week.  3. HL - Continue statin.    4. Hx of CVA - Continue statin, ASA.  5. DM2 - A1c in 3/17 was 8.5.  FU with Endocrinology for strict control.       Medication Adjustments/Labs and Tests Ordered: Current medicines are reviewed at length with the patient today.  Concerns regarding medicines are outlined above.  Medication changes, Labs and Tests ordered today are outlined in the Patient Instructions noted below. Patient Instructions  Medication Instructions:  1. START LOSARTAN 25 MG DAILY; RX SENT 2. START AMLODIPINE 2.5 MG DAILY; RX SENT Labwork: 06/15/15 PRE CATH LAB WORK ;  BMET, CBC W/DIFF, PT/INR Testing/Procedures: Your physician has requested that you have a cardiac catheterization. Cardiac catheterization is used to diagnose and/or treat various heart conditions. Doctors may recommend this procedure for a number of different reasons. The most common reason is to evaluate chest pain. Chest pain can be a symptom of coronary artery disease (CAD), and cardiac catheterization can show whether plaque is narrowing or blocking your heart's arteries. This procedure is also used to evaluate the valves, as well as measure the blood flow and oxygen levels in different parts of your heart. For further information please visit HugeFiesta.tn. Please follow instruction sheet, as given. Follow-Up: [place], [facility] 07/06/15 @ 8:45 POST CATH FOLLOW UP Any Other Special Instructions Will Be Listed Below (If Applicable). If you need a refill on your cardiac medications before your next appointment, please call your pharmacy.    Signed, Richardson Dopp, PA-C  06/08/2015 6:14 PM    [facility] [place], [place], [place]  [address] Phone: [phone]; Fax: [phone]

## 2015-06-08 ENCOUNTER — Ambulatory Visit (INDEPENDENT_AMBULATORY_CARE_PROVIDER_SITE_OTHER): Payer: Commercial Managed Care - HMO | Admitting: Physician Assistant

## 2015-06-08 ENCOUNTER — Encounter: Payer: Self-pay | Admitting: *Deleted

## 2015-06-08 ENCOUNTER — Encounter: Payer: Self-pay | Admitting: Physician Assistant

## 2015-06-08 VITALS — BP 160/70 | HR 58 | Ht 70.0 in | Wt 179.8 lb

## 2015-06-08 DIAGNOSIS — E785 Hyperlipidemia, unspecified: Secondary | ICD-10-CM

## 2015-06-08 DIAGNOSIS — Z8673 Personal history of transient ischemic attack (TIA), and cerebral infarction without residual deficits: Secondary | ICD-10-CM | POA: Diagnosis not present

## 2015-06-08 DIAGNOSIS — I10 Essential (primary) hypertension: Secondary | ICD-10-CM

## 2015-06-08 DIAGNOSIS — I25119 Atherosclerotic heart disease of native coronary artery with unspecified angina pectoris: Secondary | ICD-10-CM | POA: Diagnosis not present

## 2015-06-08 DIAGNOSIS — N183 Chronic kidney disease, stage 3 (moderate): Secondary | ICD-10-CM

## 2015-06-08 DIAGNOSIS — E1122 Type 2 diabetes mellitus with diabetic chronic kidney disease: Secondary | ICD-10-CM

## 2015-06-08 DIAGNOSIS — Z794 Long term (current) use of insulin: Secondary | ICD-10-CM

## 2015-06-08 MED ORDER — AMLODIPINE BESYLATE 2.5 MG PO TABS: 2.5000 mg | ORAL_TABLET | Freq: Every day | ORAL | Status: DC

## 2015-06-08 MED ORDER — LOSARTAN POTASSIUM 25 MG PO TABS: 25.0000 mg | ORAL_TABLET | Freq: Every day | ORAL | Status: DC

## 2015-06-08 MED ORDER — NITROGLYCERIN 0.4 MG SL SUBL
0.4000 mg | SUBLINGUAL_TABLET | SUBLINGUAL | Status: DC | PRN
Start: 2015-06-08 — End: 2015-07-12

## 2015-06-08 NOTE — Patient Instructions (Addendum)
Medication Instructions:  1. START LOSARTAN 25 MG DAILY; RX SENT 2. START AMLODIPINE 2.5 MG DAILY; RX SENT Labwork: 06/15/15 PRE CATH LAB WORK ; BMET, CBC W/DIFF, PT/INR Testing/Procedures: Your physician has requested that you have a cardiac catheterization. Cardiac catheterization is used to diagnose and/or treat various heart conditions. Doctors may recommend this procedure for a number of different reasons. The most common reason is to evaluate chest pain. Chest pain can be a symptom of coronary artery disease (CAD), and cardiac catheterization can show whether plaque is narrowing or blocking your heart's arteries. This procedure is also used to evaluate the valves, as well as measure the blood flow and oxygen levels in different parts of your heart. For further information please visit HugeFiesta[facility]tn. Please follow instruction sheet, as given. Follow-Up: [place], [facility] 07/06/15 @ 8:45 POST CATH FOLLOW UP Any Other Special Instructions Will Be Listed Below (If Applicable). If you need a refill on your cardiac medications before your next appointment, please call your pharmacy.

## 2015-06-13 ENCOUNTER — Encounter: Payer: Self-pay | Admitting: Endocrinology

## 2015-06-13 ENCOUNTER — Ambulatory Visit (INDEPENDENT_AMBULATORY_CARE_PROVIDER_SITE_OTHER): Payer: Commercial Managed Care - HMO | Admitting: Endocrinology

## 2015-06-13 VITALS — BP 138/70 | HR 74 | Temp 98.3°F | Ht 70.0 in | Wt 180.0 lb

## 2015-06-13 DIAGNOSIS — Z794 Long term (current) use of insulin: Secondary | ICD-10-CM | POA: Diagnosis not present

## 2015-06-13 DIAGNOSIS — N183 Chronic kidney disease, stage 3 (moderate): Secondary | ICD-10-CM

## 2015-06-13 DIAGNOSIS — E1122 Type 2 diabetes mellitus with diabetic chronic kidney disease: Secondary | ICD-10-CM

## 2015-06-13 LAB — POCT GLYCOSYLATED HEMOGLOBIN (HGB A1C): HEMOGLOBIN A1C: 9.4

## 2015-06-13 MED ORDER — INSULIN ASPART 100 UNIT/ML FLEXPEN: 10.0000 [IU] | PEN_INJECTOR | Freq: Every day | SUBCUTANEOUS | Status: DC

## 2015-06-13 MED ORDER — INSULIN GLARGINE 100 UNIT/ML SOLOSTAR PEN: 40.0000 [IU] | PEN_INJECTOR | SUBCUTANEOUS | Status: DC

## 2015-06-13 NOTE — Patient Instructions (Addendum)
Please change the novolog to 10 units with supper only, and: Start lantus, 40 units each morning. On this type of insulin schedule, you should eat meals on a regular schedule.  If a meal is missed or significantly delayed, your blood sugar could go low.  Therefore, you should carry a non-perishable snack, such as crackers, while at work.  check your blood sugar twice a day.  vary the time of day when you check, between before the 3 meals, and at bedtime.  also check if you have symptoms of your blood sugar being too high or too low.  please keep a record of the readings and bring it to your next appointment here (or you can bring the meter itself).  You can write it on any piece of paper.  please call us sooner if your blood sugar goes below 70, or if you have a lot of readings over 200.   Please come back for a follow-up appointment in 3 months.

## 2015-06-13 NOTE — Progress Notes (Signed)
Subjective:    Patient ID: Joseph Sanford, male    DOB: [birth year], 70 y.o.   MRN: [id number]  HPI Pt returns for f/u of diabetes mellitus: DM type: Insulin-requiring type 2 Dx'ed: 0000000 Complications: polyneuropathy, CAD, retinopathy, TIA, and renal insufficiency Therapy: insulin since 2000 DKA: never Severe hypoglycemia: never Pancreatitis: never.  Other: he stopped using V-GO, due to cost; he stopped metformin, due to diarrhea.  The pattern of cbg's indicates he does not need basal insulin.   Interval history: He says he takes 0-10 units of novolog per meal.  no cbg record, but states cbg's vary from 190-200's.  pt states he feels well in general.  He has lost a few more labs.   Past Medical History  Diagnosis Date  . Coronary artery disease   . Diabetes mellitus without complication ([place])   . Hyperlipidemia   . ED (erectile dysfunction)   . CVA (cerebral infarction)   . Chronic kidney disease     No past surgical history on file.  Social History   Social History  . Marital Status: Married    Spouse Name: N/A  . Number of Children: N/A  . Years of Education: N/A   Occupational History  . Not on file.   Social History Main Topics  . Smoking status: Never Smoker   . Smokeless tobacco: Not on file  . Alcohol Use: Not on file  . Drug Use: Not on file  . Sexual Activity: Not on file   Other Topics Concern  . Not on file   Social History Narrative    Current Outpatient Prescriptions on File Prior to Visit  Medication Sig Dispense Refill  . amLODipine (NORVASC) 2.5 MG tablet Take 1 tablet (2.5 mg total) by mouth daily. 90 tablet 3  . aspirin 81 MG tablet Take 81 mg by mouth daily.    Marland Kitchen b complex vitamins tablet Take 1 tablet by mouth daily.    . Calcium Carbonate-Vitamin D (CALCIUM-D) 600-400 MG-UNIT TABS Take by mouth.    . Cholecalciferol (CVS D3) 2000 UNITS CAPS Take 1 capsule by mouth daily.     . folic acid (FOLVITE) A999333 MCG tablet Take 400 mcg by mouth  daily.    Marland Kitchen losartan (COZAAR) 25 MG tablet Take 1 tablet (25 mg total) by mouth daily. 90 tablet 3  . metoprolol succinate (TOPROL-XL) 50 MG 24 hr tablet Take 50 mg by mouth daily.     . nitroGLYCERIN (NITROSTAT) 0.4 MG SL tablet Place 1 tablet (0.4 mg total) under the tongue every 5 (five) minutes as needed for chest pain. 25 tablet 3  . Omega-3 Fatty Acids (FISH OIL) 1200 MG CAPS Take 1 capsule by mouth every morning.     . simvastatin (ZOCOR) 40 MG tablet Take 40 mg by mouth daily.    . Zinc 50 MG CAPS Take 50 mg by mouth daily.      No current facility-administered medications on file prior to visit.    Allergies  Allergen Reactions  . Lipitor [Atorvastatin] Other (See Comments)    Brain fog  . Iodine Nausea And Vomiting  . Lisinopril Other (See Comments)    cough  . Shellfish Allergy Nausea Only and Other (See Comments)    Family History  Problem Relation Age of Onset  . Diabetes Brother     BP 138/70 mmHg  Pulse 74  Temp(Src) 98.3 F (36.8 C) (Oral)  Ht 5\' 10"  (1.778 m)  Wt 180 lb (81.647 kg)  BMI 25.83 kg/m2  SpO2 97%  Review of Systems He denies hypoglycemia.      Objective:   Physical Exam VITAL SIGNS:  See vs page GENERAL: no distress Pulses: dorsalis pedis intact bilat.   MSK: no deformity of the feet CV: no leg edema Skin:  no ulcer on the feet.  normal color and temp on the feet. Neuro: sensation is intact to touch on the feet.     A1c=9.4%    Assessment & Plan:  Insulin-requiring type 2: worse.  Noncompliance with cbg recording and isnulin: I'll work around this as best I can, which means he needs a simpler regimen.   Patient is advised the following: Patient Instructions  Please change the novolog to 10 units with supper only, and: Start lantus, 40 units each morning. On this type of insulin schedule, you should eat meals on a regular schedule.  If a meal is missed or significantly delayed, your blood sugar could go low.  Therefore, you should  carry a non-perishable snack, such as crackers, while at work.  check your blood sugar twice a day.  vary the time of day when you check, between before the 3 meals, and at bedtime.  also check if you have symptoms of your blood sugar being too high or too low.  please keep a record of the readings and bring it to your next appointment here (or you can bring the meter itself).  You can write it on any piece of paper.  please call us sooner if your blood sugar goes below 70, or if you have a lot of readings over 200.   Please come back for a follow-up appointment in 3 months.     Renato Shin, MD

## 2015-06-15 ENCOUNTER — Other Ambulatory Visit (INDEPENDENT_AMBULATORY_CARE_PROVIDER_SITE_OTHER): Payer: Commercial Managed Care - HMO | Admitting: *Deleted

## 2015-06-15 DIAGNOSIS — I25119 Atherosclerotic heart disease of native coronary artery with unspecified angina pectoris: Secondary | ICD-10-CM | POA: Diagnosis not present

## 2015-06-15 DIAGNOSIS — I209 Angina pectoris, unspecified: Secondary | ICD-10-CM | POA: Diagnosis not present

## 2015-06-15 DIAGNOSIS — I2510 Atherosclerotic heart disease of native coronary artery without angina pectoris: Secondary | ICD-10-CM | POA: Diagnosis not present

## 2015-06-15 LAB — CBC WITH DIFFERENTIAL/PLATELET
BASOS ABS: 0 {cells}/uL (ref 0–200)
Basophils Relative: 0 %
EOS ABS: 59 {cells}/uL (ref 15–500)
EOS PCT: 1 %
HCT: 40 % (ref 38.5–50.0)
Hemoglobin: 12.9 g/dL — ABNORMAL LOW (ref 13.2–17.1)
LYMPHS PCT: 44 %
Lymphs Abs: 2596 cells/uL (ref 850–3900)
MCH: 26.8 pg — AB (ref 27.0–33.0)
MCHC: 32.3 g/dL (ref 32.0–36.0)
MCV: 83 fL (ref 80.0–100.0)
MONOS PCT: 8 %
MPV: 10.3 fL (ref 7.5–12.5)
Monocytes Absolute: 472 cells/uL (ref 200–950)
NEUTROS ABS: 2773 {cells}/uL (ref 1500–7800)
NEUTROS PCT: 47 %
PLATELETS: 250 10*3/uL (ref 140–400)
RBC: 4.82 MIL/uL (ref 4.20–5.80)
RDW: 13.9 % (ref 11.0–15.0)
WBC: 5.9 10*3/uL (ref 3.8–10.8)

## 2015-06-15 NOTE — Addendum Note (Signed)
Addended by: Eulis Foster on: 06/15/2015 01:05 PM   Modules accepted: Orders

## 2015-06-16 ENCOUNTER — Telehealth: Payer: Self-pay | Admitting: *Deleted

## 2015-06-16 LAB — BASIC METABOLIC PANEL
BUN: 19 mg/dL (ref 7–25)
CO2: 22 mmol/L (ref 20–31)
CREATININE: 1.31 mg/dL — AB (ref 0.70–1.25)
Calcium: 8.9 mg/dL (ref 8.6–10.3)
Chloride: 103 mmol/L (ref 98–110)
GLUCOSE: 339 mg/dL — AB (ref 65–99)
Potassium: 4 mmol/L (ref 3.5–5.3)
Sodium: 138 mmol/L (ref 135–146)

## 2015-06-16 LAB — PROTIME-INR
INR: 1 (ref <1.50)
PROTHROMBIN TIME: 13.3 s (ref 11.6–15.2)

## 2015-06-16 NOTE — Telephone Encounter (Signed)
Tried to call pt to go over lab results though vm full, could not lmom. Lab work ok.

## 2015-06-16 NOTE — Telephone Encounter (Signed)
DPR on file for son Joseph Sanford. Lmom lab work ok..If any questions cb [phone].

## 2015-06-20 ENCOUNTER — Ambulatory Visit (HOSPITAL_COMMUNITY)
Admission: RE | Admit: 2015-06-20 | Discharge: 2015-06-20 | Disposition: A | Discharge status: Home / Self Care | Payer: Commercial Managed Care - HMO | Source: Ambulatory Visit | Attending: Interventional Cardiology | Admitting: Interventional Cardiology

## 2015-06-20 ENCOUNTER — Encounter (HOSPITAL_COMMUNITY)
Admission: RE | Disposition: A | Discharge status: Home / Self Care | Payer: Self-pay | Source: Ambulatory Visit | Attending: Interventional Cardiology

## 2015-06-20 DIAGNOSIS — Z794 Long term (current) use of insulin: Secondary | ICD-10-CM | POA: Insufficient documentation

## 2015-06-20 DIAGNOSIS — Z883 Allergy status to other anti-infective agents status: Secondary | ICD-10-CM | POA: Insufficient documentation

## 2015-06-20 DIAGNOSIS — E1122 Type 2 diabetes mellitus with diabetic chronic kidney disease: Secondary | ICD-10-CM | POA: Insufficient documentation

## 2015-06-20 DIAGNOSIS — I25810 Atherosclerosis of coronary artery bypass graft(s) without angina pectoris: Secondary | ICD-10-CM | POA: Diagnosis present

## 2015-06-20 DIAGNOSIS — I6930 Unspecified sequelae of cerebral infarction: Secondary | ICD-10-CM

## 2015-06-20 DIAGNOSIS — E785 Hyperlipidemia, unspecified: Secondary | ICD-10-CM | POA: Insufficient documentation

## 2015-06-20 DIAGNOSIS — I2584 Coronary atherosclerosis due to calcified coronary lesion: Secondary | ICD-10-CM | POA: Insufficient documentation

## 2015-06-20 DIAGNOSIS — I25119 Atherosclerotic heart disease of native coronary artery with unspecified angina pectoris: Secondary | ICD-10-CM | POA: Insufficient documentation

## 2015-06-20 DIAGNOSIS — I2582 Chronic total occlusion of coronary artery: Secondary | ICD-10-CM | POA: Diagnosis not present

## 2015-06-20 DIAGNOSIS — I2510 Atherosclerotic heart disease of native coronary artery without angina pectoris: Secondary | ICD-10-CM | POA: Diagnosis not present

## 2015-06-20 DIAGNOSIS — Z91013 Allergy to seafood: Secondary | ICD-10-CM | POA: Insufficient documentation

## 2015-06-20 DIAGNOSIS — Z8673 Personal history of transient ischemic attack (TIA), and cerebral infarction without residual deficits: Secondary | ICD-10-CM | POA: Insufficient documentation

## 2015-06-20 DIAGNOSIS — Z7982 Long term (current) use of aspirin: Secondary | ICD-10-CM | POA: Diagnosis not present

## 2015-06-20 DIAGNOSIS — Z955 Presence of coronary angioplasty implant and graft: Secondary | ICD-10-CM | POA: Diagnosis not present

## 2015-06-20 DIAGNOSIS — N183 Chronic kidney disease, stage 3 (moderate): Secondary | ICD-10-CM | POA: Diagnosis not present

## 2015-06-20 DIAGNOSIS — N529 Male erectile dysfunction, unspecified: Secondary | ICD-10-CM | POA: Insufficient documentation

## 2015-06-20 DIAGNOSIS — I129 Hypertensive chronic kidney disease with stage 1 through stage 4 chronic kidney disease, or unspecified chronic kidney disease: Secondary | ICD-10-CM | POA: Diagnosis not present

## 2015-06-20 DIAGNOSIS — I10 Essential (primary) hypertension: Secondary | ICD-10-CM | POA: Diagnosis present

## 2015-06-20 HISTORY — PX: CARDIAC CATHETERIZATION: SHX172

## 2015-06-20 LAB — GLUCOSE, CAPILLARY
GLUCOSE-CAPILLARY: 158 mg/dL — AB (ref 65–99)
GLUCOSE-CAPILLARY: 159 mg/dL — AB (ref 65–99)

## 2015-06-20 SURGERY — LEFT HEART CATH AND CORONARY ANGIOGRAPHY

## 2015-06-20 MED ORDER — VERAPAMIL HCL 2.5 MG/ML IV SOLN
INTRAVENOUS | Status: AC
  Filled 2015-06-20: qty 2

## 2015-06-20 MED ORDER — MIDAZOLAM HCL 2 MG/2ML IJ SOLN
INTRAMUSCULAR | Status: DC | PRN
  Administered 2015-06-20: 1 mg via INTRAVENOUS

## 2015-06-20 MED ORDER — DIPHENHYDRAMINE HCL 50 MG/ML IJ SOLN
INTRAMUSCULAR | Status: AC
  Filled 2015-06-20: qty 1

## 2015-06-20 MED ORDER — VERAPAMIL HCL 2.5 MG/ML IV SOLN
INTRAVENOUS | Status: DC | PRN
  Administered 2015-06-20: 10 mL via INTRA_ARTERIAL

## 2015-06-20 MED ORDER — FAMOTIDINE IN NACL 20-0.9 MG/50ML-% IV SOLN
INTRAVENOUS | Status: AC
  Filled 2015-06-20: qty 50

## 2015-06-20 MED ORDER — FENTANYL CITRATE (PF) 100 MCG/2ML IJ SOLN
INTRAMUSCULAR | Status: DC | PRN
  Administered 2015-06-20: 50 ug via INTRAVENOUS

## 2015-06-20 MED ORDER — METHYLPREDNISOLONE SODIUM SUCC 125 MG IJ SOLR
INTRAMUSCULAR | Status: AC
  Filled 2015-06-20: qty 2

## 2015-06-20 MED ORDER — METHYLPREDNISOLONE SODIUM SUCC 125 MG IJ SOLR
125.0000 mg | Freq: Once | INTRAMUSCULAR | Status: AC
  Administered 2015-06-20: 125 mg via INTRAVENOUS

## 2015-06-20 MED ORDER — IOPAMIDOL (ISOVUE-370) INJECTION 76%
INTRAVENOUS | Status: AC
  Filled 2015-06-20: qty 50

## 2015-06-20 MED ORDER — FENTANYL CITRATE (PF) 100 MCG/2ML IJ SOLN
INTRAMUSCULAR | Status: AC
  Filled 2015-06-20: qty 2

## 2015-06-20 MED ORDER — ASPIRIN 81 MG PO CHEW: 81.0000 mg | CHEWABLE_TABLET | ORAL | Status: DC

## 2015-06-20 MED ORDER — DIPHENHYDRAMINE HCL 50 MG/ML IJ SOLN
25.0000 mg | Freq: Once | INTRAMUSCULAR | Status: AC
  Administered 2015-06-20: 25 mg via INTRAVENOUS

## 2015-06-20 MED ORDER — MIDAZOLAM HCL 2 MG/2ML IJ SOLN
INTRAMUSCULAR | Status: AC
  Filled 2015-06-20: qty 2

## 2015-06-20 MED ORDER — HEPARIN (PORCINE) IN NACL 2-0.9 UNIT/ML-% IJ SOLN
INTRAMUSCULAR | Status: DC | PRN
  Administered 2015-06-20: 1000 mL

## 2015-06-20 MED ORDER — HEPARIN SODIUM (PORCINE) 1000 UNIT/ML IJ SOLN
INTRAMUSCULAR | Status: DC | PRN
  Administered 2015-06-20: 4300 [IU] via INTRAVENOUS

## 2015-06-20 MED ORDER — HEPARIN SODIUM (PORCINE) 1000 UNIT/ML IJ SOLN
INTRAMUSCULAR | Status: AC
  Filled 2015-06-20: qty 1

## 2015-06-20 MED ORDER — LIDOCAINE HCL (PF) 1 % IJ SOLN
INTRAMUSCULAR | Status: AC
  Filled 2015-06-20: qty 30

## 2015-06-20 MED ORDER — LIDOCAINE HCL (PF) 1 % IJ SOLN
INTRAMUSCULAR | Status: DC | PRN
  Administered 2015-06-20: 2 mL

## 2015-06-20 MED ORDER — SODIUM CHLORIDE 0.9 % IV SOLN
INTRAVENOUS | Status: DC
  Administered 2015-06-20: 08:00:00 via INTRAVENOUS

## 2015-06-20 MED ORDER — FAMOTIDINE IN NACL 20-0.9 MG/50ML-% IV SOLN
20.0000 mg | Freq: Once | INTRAVENOUS | Status: AC
  Administered 2015-06-20: 20 mg via INTRAVENOUS

## 2015-06-20 MED ORDER — IOPAMIDOL (ISOVUE-370) INJECTION 76%
INTRAVENOUS | Status: DC | PRN
  Administered 2015-06-20: 165 mL via INTRA_ARTERIAL

## 2015-06-20 MED ORDER — HEPARIN (PORCINE) IN NACL 2-0.9 UNIT/ML-% IJ SOLN
INTRAMUSCULAR | Status: AC
  Filled 2015-06-20: qty 1000

## 2015-06-20 MED ORDER — IOPAMIDOL (ISOVUE-370) INJECTION 76%
INTRAVENOUS | Status: AC
  Filled 2015-06-20: qty 100

## 2015-06-20 SURGICAL SUPPLY — 14 items
CATH EXPO 5F MPA-1 (CATHETERS) ×3 IMPLANT
CATH INFINITI 5 FR AR1 MOD (CATHETERS) ×3 IMPLANT
CATH INFINITI 5 FR JL3.5 (CATHETERS) ×3 IMPLANT
CATH INFINITI 5FR AL1 (CATHETERS) ×3 IMPLANT
CATH INFINITI JR4 5F (CATHETERS) ×3 IMPLANT
CATH LAUNCHER 5F RADR (CATHETERS) ×1 IMPLANT
CATHETER LAUNCHER 5F RADR (CATHETERS) ×3
DEVICE RAD COMP TR BAND LRG (VASCULAR PRODUCTS) ×3 IMPLANT
GLIDESHEATH SLEND A-KIT 6F 22G (SHEATH) ×3 IMPLANT
KIT HEART LEFT (KITS) ×3 IMPLANT
PACK CARDIAC CATHETERIZATION (CUSTOM PROCEDURE TRAY) ×3 IMPLANT
TRANSDUCER W/STOPCOCK (MISCELLANEOUS) ×3 IMPLANT
TUBING CIL FLEX 10 FLL-RA (TUBING) ×3 IMPLANT
WIRE SAFE-T 1.5MM-J .035X260CM (WIRE) ×3 IMPLANT

## 2015-06-20 NOTE — Discharge Instructions (Signed)
Radial Site Care °Refer to this sheet in the next few weeks. These instructions provide you with information about caring for yourself after your procedure. Your health care provider may also give you more specific instructions. Your treatment has been planned according to current medical practices, but problems sometimes occur. Call your health care provider if you have any problems or questions after your procedure. °WHAT TO EXPECT AFTER THE PROCEDURE °After your procedure, it is typical to have the following: °· Bruising at the radial site that usually fades within 1-2 weeks. °· Blood collecting in the tissue (hematoma) that may be painful to the touch. It should usually decrease in size and tenderness within 1-2 weeks. °HOME CARE INSTRUCTIONS °· Take medicines only as directed by your health care provider. °· You may shower 24-48 hours after the procedure or as directed by your health care provider. Remove the bandage (dressing) and gently wash the site with plain soap and water. Pat the area dry with a clean towel. Do not rub the site, because this may cause bleeding. °· Do not take baths, swim, or use a hot tub until your health care provider approves. °· Check your insertion site every day for redness, swelling, or drainage. °· Do not apply powder or lotion to the site. °· Do not flex or bend the affected arm for 24 hours or as directed by your health care provider. °· Do not push or pull heavy objects with the affected arm for 24 hours or as directed by your health care provider. °· Do not lift over 10 lb (4.5 kg) for 5 days after your procedure or as directed by your health care provider. °· Ask your health care provider when it is okay to: °¨ Return to work or school. °¨ Resume usual physical activities or sports. °¨ Resume sexual activity. °· Do not drive home if you are discharged the same day as the procedure. Have someone else drive you. °· You may drive 24 hours after the procedure unless otherwise  instructed by your health care provider. °· Do not operate machinery or power tools for 24 hours after the procedure. °· If your procedure was done as an outpatient procedure, which means that you went home the same day as your procedure, a responsible adult should be with you for the first 24 hours after you arrive home. °· Keep all follow-up visits as directed by your health care provider. This is important. °SEEK MEDICAL CARE IF: °· You have a fever. °· You have chills. °· You have increased bleeding from the radial site. Hold pressure on the site. °SEEK IMMEDIATE MEDICAL CARE IF: °· You have unusual pain at the radial site. °· You have redness, warmth, or swelling at the radial site. °· You have drainage (other than a small amount of blood on the dressing) from the radial site. °· The radial site is bleeding, and the bleeding does not stop after 30 minutes of holding steady pressure on the site. °· Your arm or hand becomes pale, cool, tingly, or numb. °  °This information is not intended to replace advice given to you by your health care provider. Make sure you discuss any questions you have with your health care provider. °  °Document Released: 02/02/2010 Document Revised: 01/21/2014 Document Reviewed: 07/19/2013 °Elsevier Interactive Patient Education ©2016 [employer] Inc[employer] ° °

## 2015-06-20 NOTE — Progress Notes (Signed)
Dr Tamala Julian in to see client

## 2015-06-20 NOTE — Progress Notes (Signed)
Pt. Arrived to [facility] with pepcid bag clamped off and not running, Pepcid started to complete.

## 2015-06-20 NOTE — Interval H&P Note (Signed)
Cath Lab Visit (complete for each Cath Lab visit)  Clinical Evaluation Leading to the Procedure:   ACS: No.  Non-ACS:    Anginal Classification: CCS III  Anti-ischemic medical therapy: Maximal Therapy (2 or more classes of medications)  Non-Invasive Test Results: No non-invasive testing performed  Prior CABG: No previous CABG      History and Physical Interval Note:  06/20/2015 11:20 AM  Joseph Sanford  has presented today for surgery, with the diagnosis of angina  The various methods of treatment have been discussed with the patient and family. After consideration of risks, benefits and other options for treatment, the patient has consented to  Procedure(s): Left Heart Cath and Coronary Angiography (N/A) as a surgical intervention .  The patient's history has been reviewed, patient examined, no change in status, stable for surgery.  I have reviewed the patient's chart and labs.  Questions were answered to the patient's satisfaction.     Belva Crome III

## 2015-06-20 NOTE — H&P (View-Only) (Signed)
Cardiology Office Note:    Date:  06/08/2015   ID:  CLEMON Sanford, DOB 1945-11-18, MRN [id number]  PCP:  Mayra Neer, MD  Cardiologist:  Dr. Daneen Schick   Electrophysiologist:  n/a  Referring MD: Mayra Neer, MD   Chief Complaint  Patient presents with  . Chest Pain    History of Present Illness:     Joseph Sanford is a 70 y.o. male with a hx of CAD status post prior PCI, prior CVA, HTN, HL, CKD related to diabetes. Last PCI in 8/10 with DES to the PDA. Seen by Dr. Tamala Julian 6/16.   The patient recently called in with complaints of chest discomfort relieved by nitroglycerin. After review with Dr. Tamala Julian, he recommended the patient come in for an appointment to be set up for cardiac catheterization.   He is here alone.  His wife passed away in 04/17/10.  He notes exertional chest burning with exertion for the past 1 month. Symptoms do get better with rest. He does have some relief with nitroglycerin. Denies rest symptoms. Denies dyspnea. Denies syncope. Denies orthopnea, PND or edema. He does have some symptoms with eating spicy foods.     Past Medical History  Diagnosis Date  . Coronary artery disease   . Diabetes mellitus without complication ([place])   . Hyperlipidemia   . ED (erectile dysfunction)   . CVA (cerebral infarction)   . Chronic kidney disease     No past surgical history on file.  Current Medications: Outpatient Prescriptions Prior to Visit  Medication Sig Dispense Refill  . aspirin 81 MG tablet Take 81 mg by mouth daily.    Marland Kitchen b complex vitamins tablet Take 1 tablet by mouth daily.    . Calcium Carbonate-Vitamin D (CALCIUM-D) 600-400 MG-UNIT TABS Take by mouth.    . Cholecalciferol (CVS D3) 2000 UNITS CAPS Take 1 capsule by mouth daily.     . folic acid (FOLVITE) [id number] MCG tablet Take 400 mcg by mouth daily.    . insulin aspart (NOVOLOG) 100 UNIT/ML injection 3 times a day (just before each meal) 12-25-28 units. 20 mL 3  . metoprolol succinate (TOPROL-XL) 50 MG 24  hr tablet Take 50 mg by mouth daily.     . Omega-3 Fatty Acids (FISH OIL) 1200 MG CAPS Take 1 capsule by mouth every morning.     . simvastatin (ZOCOR) 40 MG tablet Take 40 mg by mouth daily.    . Zinc 50 MG CAPS Take 50 mg by mouth daily.     . nitroGLYCERIN (NITROSTAT) 0.4 MG SL tablet Place 0.4 mg under the tongue every 5 (five) minutes as needed for chest pain.     No facility-administered medications prior to visit.      Allergies:   Lipitor; Iodine; Lisinopril; and Shellfish allergy   Social History   Social History  . Marital Status: Married    Spouse Name: N/A  . Number of Children: N/A  . Years of Education: N/A   Social History Main Topics  . Smoking status: Never Smoker   . Smokeless tobacco: None  . Alcohol Use: None  . Drug Use: None  . Sexual Activity: Not Asked   Other Topics Concern  . None   Social History Narrative     Family History:  The patient's family history includes Diabetes in his brother.   ROS:   Please see the history of present illness.    Review of Systems  Constitution: Positive for decreased appetite and  weight loss. Negative for fever and night sweats.  HENT: Positive for hearing loss.   Cardiovascular: Positive for chest pain.  Hematologic/Lymphatic: Bruises/bleeds easily.  Musculoskeletal: Positive for joint swelling.  Gastrointestinal: Positive for diarrhea.       Dark stool diarrhea  Genitourinary: Positive for incomplete emptying.   All other systems reviewed and are negative.   Physical Exam:    VS:  BP 160/70 mmHg  Pulse 58  Ht 5\' 10"  (1.778 m)  Wt 179 lb 12.8 oz (81.557 kg)  BMI 25.80 kg/m2   GEN: Well nourished, well developed, in no acute distress HEENT: normal Neck: no JVD, no masses Cardiac: Normal S1/S2, RRR; no murmurs, rubs, or gallops, no edema;  no carotid bruits,   Respiratory:  clear to auscultation bilaterally; no wheezing, rhonchi or rales GI: soft, nontender, nondistended MS: no deformity or  atrophy Skin: warm and dry Neuro: No focal deficits  Psych: Alert and oriented x 3, normal affect  Wt Readings from Last 3 Encounters:  06/08/15 179 lb 12.8 oz (81.557 kg)  03/21/15 182 lb (82.555 kg)  03/07/15 182 lb (82.555 kg)      Studies/Labs Reviewed:     EKG:  EKG is  ordered today.  The ekg ordered today demonstrates NSR, HR 57, normal axis, TWI in 3, QTc 404 ms  Recent Labs: No results found for requested labs within last 365 days.  Labs with PCP 05/09/15: Creatinine 1.18, potassium 4.0, ALT 17, LDL 128  Recent Lipid Panel    Component Value Date/Time   CHOL  11/05/2008 0159    91        ATP III CLASSIFICATION:  <200     mg/dL   Desirable  200-239  mg/dL   Borderline High  >=240    mg/dL   High          TRIG 156* 11/05/2008 0159   HDL 32* 11/05/2008 0159   CHOLHDL 2.8 11/05/2008 0159   VLDL 31 11/05/2008 0159   LDLCALC  11/05/2008 0159    28        Total Cholesterol/HDL:CHD Risk Coronary Heart Disease Risk Table                     Men   Women  1/2 Average Risk   3.4   3.3  Average Risk       5.0   4.4  2 X Average Risk   9.6   7.1  3 X Average Risk  23.4   11.0        Use the calculated Patient Ratio above and the CHD Risk Table to determine the patient's CHD Risk.        ATP III CLASSIFICATION (LDL):  <100     mg/dL   Optimal  100-129  mg/dL   Near or Above                    Optimal  130-159  mg/dL   Borderline  160-189  mg/dL   High  >190     mg/dL   Very High    Additional studies/ records that were reviewed today include:   Echo 8/13 EF 60-65%, moderate concentric LVH, mild aortic root dilatation (3.9 cm), trace MR, trace TR, grade 2 diastolic dysfunction  Carotid US 6/13 No hemodynamically significant ICA stenosis  LHC 8/10 LM patent LAD mid stent patent with mid and proximal LAD 50%, D2 jailed by stent with ostial 60% LCx  proximal 50% RI 70% at bifurcation RCA mid 50%, distal 40%, patent stent before bifurcation ([place]), PDA with  high-grade 95% proximal focal ISR, 70-80% EF 50% PCI: 8 at 2.5 mm Xience DES to the PDA   ASSESSMENT:     1. Coronary artery disease involving native coronary artery of native heart with angina pectoris ([place])   2. Essential hypertension   3. Hyperlipidemia   4. History of CVA (cerebrovascular accident)   5. Type 2 diabetes mellitus with stage 3 chronic kidney disease, with long-term current use of insulin (HCC)     PLAN:     In order of problems listed above:  1. CAD - He presents today with complaints of exertional chest discomfort. Symptoms sound consistent with CCS class 2-3 angina. I reviewed his case today with Dr. Johnsie Cancel (DOD) as well as his cardiologist, Dr. Tamala Julian. Plan is to proceed with cardiac catheterization.  Risks and benefits of cardiac catheterization have been discussed with the patient.  These include bleeding, infection, kidney damage, stroke, heart attack, death.  The patient understands these risks and is willing to proceed. He does have a history of using PDE-5 inhibitors. Continue ASA, statin.  Add Norvasc 2.5 mg QD.  Arrange LHC with Dr. Daneen Schick. Patient does have a history of shellfish allergy. He tells me that this only causes nausea. He does not note any prior history of reaction to contrast.  2. HTN - BP uncontrolled.  Add norvasc. Start Losartan 25 mg QD given CKD.  BMET 1 week.  3. HL - Continue statin.    4. Hx of CVA - Continue statin, ASA.  5. DM2 - A1c in 3/17 was 8.5.  FU with Endocrinology for strict control.       Medication Adjustments/Labs and Tests Ordered: Current medicines are reviewed at length with the patient today.  Concerns regarding medicines are outlined above.  Medication changes, Labs and Tests ordered today are outlined in the Patient Instructions noted below. Patient Instructions  Medication Instructions:  1. START LOSARTAN 25 MG DAILY; RX SENT 2. START AMLODIPINE 2.5 MG DAILY; RX SENT Labwork: 06/15/15 PRE CATH LAB WORK ;  BMET, CBC W/DIFF, PT/INR Testing/Procedures: Your physician has requested that you have a cardiac catheterization. Cardiac catheterization is used to diagnose and/or treat various heart conditions. Doctors may recommend this procedure for a number of different reasons. The most common reason is to evaluate chest pain. Chest pain can be a symptom of coronary artery disease (CAD), and cardiac catheterization can show whether plaque is narrowing or blocking your heart's arteries. This procedure is also used to evaluate the valves, as well as measure the blood flow and oxygen levels in different parts of your heart. For further information please visit [email or web address]. Please follow instruction sheet, as given. Follow-Up: [place], [facility] 07/06/15 @ 8:45 POST CATH FOLLOW UP Any Other Special Instructions Will Be Listed Below (If Applicable). If you need a refill on your cardiac medications before your next appointment, please call your pharmacy.    Signed, Richardson Dopp, PA-C  06/08/2015 6:14 PM    [facility] [place], [place], [place]  [address] Phone: [phone]; Fax: [phone]

## 2015-06-21 ENCOUNTER — Encounter (HOSPITAL_COMMUNITY): Payer: Self-pay | Admitting: Interventional Cardiology

## 2015-06-21 ENCOUNTER — Institutional Professional Consult (permissible substitution) (INDEPENDENT_AMBULATORY_CARE_PROVIDER_SITE_OTHER): Payer: Commercial Managed Care - HMO | Admitting: Cardiothoracic Surgery

## 2015-06-21 VITALS — BP 125/78 | HR 82 | Resp 16 | Ht 70.0 in | Wt 180.0 lb

## 2015-06-21 DIAGNOSIS — I2510 Atherosclerotic heart disease of native coronary artery without angina pectoris: Secondary | ICD-10-CM

## 2015-06-21 DIAGNOSIS — E1159 Type 2 diabetes mellitus with other circulatory complications: Secondary | ICD-10-CM | POA: Diagnosis not present

## 2015-06-21 DIAGNOSIS — I208 Other forms of angina pectoris: Secondary | ICD-10-CM

## 2015-06-21 NOTE — H&P (Signed)
PCP is Mayra Neer, MD Referring Provider is Belva Crome, MD  Chief Complaint  Patient presents with  . Coronary Artery Disease    Surgical eval, Cardiac Cath 06/20/15   Patient examined, coronary angiogram personally reviewed and discussed and counseled with patient  HPI: 70 year old diabetic nonsmoker with prior history of CAD and PCI of RCA and LAD in the past presents with symptoms of class III exertional angina relieved with nitroglycerin. Usually his symptoms are in the morning when the temperature is cold and he walks uphill. 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 He lives alone-widower.  The patient was seen in the cardiology office and set up for cardiac catheterization which was performed yesterday by Dr. Tamala Julian via right radial artery. The patient has a high-grade 95% stenosis of the mid RCA with ulcerated plaque. The stent in the distal RCA-PDA is patent. The vessel beyond the stent is small but appears graftable.  The LAD has a proximal 75% stenosis, the ramus intermediate has an 80% stenosis, the diagonal branch of the LAD has a 75% stenosis. The circumflex has a small third marginal with stenosis but the vessel appears too small to graft. LVEDP is normal. Ventriculogram shows normal LV EF.  Patient has never had previous major surgery other than right knee arthroscopy under general anesthesia. The patient denies previous chest trauma. The patient's last hemoglobin A1c was 9.8 and he was placed on Lantus by his endocrinologist. The patient has had bilateral cataract surgery but denies retinopathy. The patient denies neuropathy. The patient has moderate stage III chronic kidney disease from diabetes and is not taking metformin  Past Medical History  Diagnosis Date  . Coronary artery disease   . Diabetes mellitus without complication ([place])   . Hyperlipidemia   . ED (erectile dysfunction)   . CVA (cerebral infarction)   .  Chronic kidney disease     Past Surgical History  Procedure Laterality Date  . Cardiac catheterization N/A 06/20/2015    Procedure: Left Heart Cath and Coronary Angiography;  Surgeon: Belva Crome, MD;  Location: [place] [facility];  Service: Cardiovascular;  Laterality: N/A;    Family History  Problem Relation Age of Onset  . Diabetes Brother     Social History Social History  Substance Use Topics  . Smoking status: Never Smoker   . Smokeless tobacco: None  . Alcohol Use: None    Current Outpatient Prescriptions  Medication Sig Dispense Refill  . amLODipine (NORVASC) 2.5 MG tablet Take 1 tablet (2.5 mg total) by mouth daily. 90 tablet 3  . aspirin EC 81 MG tablet Take 81 mg by mouth daily.    . B Complex-C (B-COMPLEX WITH VITAMIN C) tablet Take 1 tablet by mouth daily.    . Calcium Carb-Cholecalciferol (CALCIUM 600 + D PO) Take 600 mg by mouth daily.    . Cholecalciferol (VITAMIN D3 SUPER STRENGTH PO) Take 1 tablet by mouth daily.    . Coenzyme Q10 (COQ10) 50 MG CAPS Take 50 mg by mouth daily.    . Cyanocobalamin (VITAMIN B-12) 5000 MCG TBDP Take 5,000 mcg by mouth daily.    . diclofenac (VOLTAREN) 75 MG EC tablet Take 75 mg by mouth daily as needed (gout pain).    . folic acid (FOLVITE) [id number] MCG tablet Take 800 mcg by mouth daily.    . Garlic 123XX123 MG CAPS Take 1,000 mg by mouth daily.    . insulin aspart (NOVOLOG FLEXPEN) 100 UNIT/ML  FlexPen Inject 10 Units into the skin daily with supper. And pen needles 2/day (Patient taking differently: Inject 40 Units into the skin daily with supper. And pen needles 2/day) 15 mL 11  . Insulin Glargine (LANTUS SOLOSTAR) 100 UNIT/ML Solostar Pen Inject 40 Units into the skin every morning. 15 mL 11  . losartan (COZAAR) 25 MG tablet Take 1 tablet (25 mg total) by mouth daily. 90 tablet 3  . Magnesium 250 MG TABS Take 250 mg by mouth daily.    . metoprolol succinate (TOPROL-XL) 50 MG 24 hr tablet Take 50 mg by mouth daily.     . nitroGLYCERIN  (NITROSTAT) 0.4 MG SL tablet Place 1 tablet (0.4 mg total) under the tongue every 5 (five) minutes as needed for chest pain. 25 tablet 3  . Omega-3 Fatty Acids (FISH OIL) 1200 MG CAPS Take 1 capsule by mouth every 30 (thirty) days.     . simvastatin (ZOCOR) 40 MG tablet Take 40 mg by mouth daily.    . Zinc 50 MG CAPS Take 50 mg by mouth daily.      No current facility-administered medications for this visit.    Allergies  Allergen Reactions  . Lipitor [Atorvastatin] Other (See Comments)    Brain fog  . Iodine Nausea And Vomiting  . Lisinopril Other (See Comments)    cough  . Shellfish Allergy Nausea And Vomiting    Reaction to shrimp    Review of Systems         Review of Systems :  [ y ] = yes, [  ] = no        General :  Weight gain [   ]    Weight loss  [place].Spore   ]  Fatigue [  ]  Fever [  ]  Chills  [  ]                                Weakness  [  ]           HEENT    Headache [  ]  Dizziness [  ]  Blurred vision [  ] Glaucoma  [  ]                          Nosebleeds [  ] Painful or loose teeth [  ]        Cardiac :  Chest pain/ pressure [ yes ]  Resting SOB [  ] exertional SOB [  ]                        Orthopnea [  ]  Pedal edema  [  ]  Palpitations [  ] Syncope/presyncope [ ]                         Paroxysmal nocturnal dyspnea [  ]         Pulmonary : cough [  ]  wheezing [  ]  Hemoptysis [  ] Sputum [  ] Snoring [  ]                              Pneumothorax [  ]  Sleep apnea [  ]        GI :  Vomiting [  ]  Dysphagia [  ]  Melena  [  ]  Abdominal pain [  ] BRBPR [  ]              Heart burn [  ]  Constipation [  ] Diarrhea  [  ] Colonoscopy [   ]        GU : Hematuria [  ]  Dysuria [  ]  Nocturia [  ] UTI's [  ]        Vascular : Claudication [  ]  Rest pain [  ]  DVT [  ] Vein stripping [  ] leg ulcers [  ]                          TIA [  ] Stroke [place] without functional impairment  ]  Varicose veins [  ]        NEURO :  Headaches  [  ] Seizures [  ] Vision changes  [yes bilateral cataract surgery  ] Paresthesias [  ]                                       Seizures [  ]        Musculoskeletal :  Arthritis [  ] Gout  [yes- very occasional  ]  Back pain [  ]  Joint pain [  ]        Skin :  Rash [  ]  Melanoma [  ] Sores [  ]        Heme : Bleeding problems [  ]Clotting Disorders [  ] Anemia [  ]Blood Transfusion [ ]         Endocrine : Diabetes [ yes orally controlled ] Heat or Cold intolerance [  ] Polyuria [  ]excessive thirst [ ]         Psych : Depression [  ]  Anxiety [  ]  Psych hospitalizations [  ] Memory change [  ]                                               BP 125/78 mmHg  Pulse 82  Resp 16  Ht 5\' 10"  (1.778 m)  Wt 180 lb (81.647 kg)  BMI 25.83 kg/m2  SpO2 98% Physical Exam      Physical Exam  General: 70 year old AA male pleasant well nourished no acute distress HEENT: Normocephalic pupils equal , dentition with full dental plates Neck: Supple without JVD, adenopathy, or bruit Chest: Clear to auscultation, symmetrical breath sounds, no rhonchi, no tenderness             or deformity Cardiovascular: Regular rate and rhythm, no murmur, no gallop, peripheral pulses             palpable in all extremities Abdomen:  Soft, nontender, no palpable mass or organomegaly Extremities: Warm, well-perfused, no clubbing cyanosis edema or tenderness,              no venous stasis changes of the legs Rectal/GU: Deferred Neuro: Grossly non--focal and symmetrical throughout Skin: Clean and dry without rash or ulceration   Diagnostic Tests: Coronary arteriograms personally reviewed and  discussed with patient. The patient has severe three-vessel CAD with a diabetic pattern. The patient has a distal RCA stent and would need a vein graft beyond the stent-the distal vessel is small and not an optimal target but probably adequate  Impression: I would agree with recommendation for CABG by the patient's cardiologist as his best long-term  therapeutic option. I discussed the procedure in detail the patient including indications benefits alternatives and risks. The patient will stop his omega-3 fish oil, continue his other medications and will be scheduled for CABG x4 on June 16 at [place].  Plan:   Len Childs, MD [facility] [phone]

## 2015-06-22 ENCOUNTER — Other Ambulatory Visit: Payer: Self-pay | Admitting: *Deleted

## 2015-06-22 DIAGNOSIS — I2510 Atherosclerotic heart disease of native coronary artery without angina pectoris: Secondary | ICD-10-CM

## 2015-06-28 ENCOUNTER — Encounter (HOSPITAL_COMMUNITY)
Admission: RE | Admit: 2015-06-28 | Discharge: 2015-06-28 | Disposition: A | Discharge status: Home / Self Care | Payer: Commercial Managed Care - HMO | Source: Ambulatory Visit | Attending: Cardiothoracic Surgery | Admitting: Cardiothoracic Surgery

## 2015-06-28 ENCOUNTER — Encounter (HOSPITAL_COMMUNITY): Payer: Self-pay

## 2015-06-28 ENCOUNTER — Ambulatory Visit (HOSPITAL_COMMUNITY)
Admission: RE | Admit: 2015-06-28 | Discharge: 2015-06-28 | Disposition: A | Discharge status: Home / Self Care | Payer: Commercial Managed Care - HMO | Source: Ambulatory Visit | Attending: Cardiothoracic Surgery | Admitting: Cardiothoracic Surgery

## 2015-06-28 ENCOUNTER — Ambulatory Visit (HOSPITAL_BASED_OUTPATIENT_CLINIC_OR_DEPARTMENT_OTHER)
Admission: RE | Admit: 2015-06-28 | Discharge: 2015-06-28 | Disposition: A | Discharge status: Home / Self Care | Payer: Commercial Managed Care - HMO | Source: Ambulatory Visit | Attending: Cardiothoracic Surgery | Admitting: Cardiothoracic Surgery

## 2015-06-28 VITALS — BP 141/82 | HR 56 | Temp 97.0°F | Resp 18 | Ht 70.5 in | Wt 177.4 lb

## 2015-06-28 DIAGNOSIS — I517 Cardiomegaly: Secondary | ICD-10-CM | POA: Insufficient documentation

## 2015-06-28 DIAGNOSIS — Z01818 Encounter for other preprocedural examination: Secondary | ICD-10-CM | POA: Diagnosis not present

## 2015-06-28 DIAGNOSIS — I2510 Atherosclerotic heart disease of native coronary artery without angina pectoris: Secondary | ICD-10-CM | POA: Insufficient documentation

## 2015-06-28 DIAGNOSIS — Z01812 Encounter for preprocedural laboratory examination: Secondary | ICD-10-CM | POA: Diagnosis not present

## 2015-06-28 DIAGNOSIS — Z0183 Encounter for blood typing: Secondary | ICD-10-CM | POA: Insufficient documentation

## 2015-06-28 HISTORY — DX: Cardiac arrhythmia, unspecified: I49.9

## 2015-06-28 HISTORY — DX: Personal history of urinary calculi: Z87.442

## 2015-06-28 HISTORY — DX: Essential (primary) hypertension: I10

## 2015-06-28 HISTORY — DX: Acute myocardial infarction, unspecified: I21.9

## 2015-06-28 HISTORY — DX: Gout, unspecified: M10.9

## 2015-06-28 LAB — URINALYSIS, ROUTINE W REFLEX MICROSCOPIC
Bilirubin Urine: NEGATIVE
Glucose, UA: NEGATIVE mg/dL
Hgb urine dipstick: NEGATIVE
Ketones, ur: NEGATIVE mg/dL
Leukocytes, UA: NEGATIVE
Nitrite: NEGATIVE
Protein, ur: NEGATIVE mg/dL
Specific Gravity, Urine: 1.021 (ref 1.005–1.030)
pH: 5 (ref 5.0–8.0)

## 2015-06-28 LAB — TYPE AND SCREEN
ABO/RH(D): O POS
Antibody Screen: NEGATIVE

## 2015-06-28 LAB — BLOOD GAS, ARTERIAL
Acid-base deficit: 0.4 mmol/L (ref 0.0–2.0)
Bicarbonate: 23.9 meq/L (ref 20.0–24.0)
O2 Saturation: 97.2 %
Patient temperature: 98.6
TCO2: 25.1 mmol/L (ref 0–100)
pCO2 arterial: 39.7 mmHg (ref 35.0–45.0)
pH, Arterial: 7.396 (ref 7.350–7.450)
pO2, Arterial: 99.3 mmHg (ref 80.0–100.0)

## 2015-06-28 LAB — PULMONARY FUNCTION TEST
DL/VA % pred: 111 %
DL/VA: 5.16 ml/min/mmHg/L
DLCO unc % pred: 72 %
DLCO unc: 23.33 ml/min/mmHg
FEF 25-75 Post: 2.5 L/sec
FEF 25-75 Pre: 3.39 L/sec
FEF2575-%Change-Post: -26 %
FEF2575-%Pred-Post: 98 %
FEF2575-%Pred-Pre: 134 %
FEV1-%Change-Post: -4 %
FEV1-%Pred-Post: 87 %
FEV1-%Pred-Pre: 91 %
FEV1-Post: 2.56 L
FEV1-Pre: 2.68 L
FEV1FVC-%Change-Post: -2 %
FEV1FVC-%Pred-Pre: 113 %
FEV6-%Change-Post: -2 %
FEV6-%Pred-Post: 81 %
FEV6-%Pred-Pre: 83 %
FEV6-Post: 3.01 L
FEV6-Pre: 3.08 L
FEV6FVC-%Pred-Post: 104 %
FEV6FVC-%Pred-Pre: 104 %
FVC-%Change-Post: -1 %
FVC-%Pred-Post: 78 %
FVC-%Pred-Pre: 79 %
FVC-Post: 3.02 L
FVC-Pre: 3.08 L
Post FEV1/FVC ratio: 85 %
Post FEV6/FVC ratio: 100 %
Pre FEV1/FVC ratio: 87 %
Pre FEV6/FVC Ratio: 100 %
RV % pred: 27 %
RV: 0.66 L
TLC % pred: 59 %
TLC: 4.17 L

## 2015-06-28 LAB — CBC
HCT: 42.1 % (ref 39.0–52.0)
Hemoglobin: 13.4 g/dL (ref 13.0–17.0)
MCH: 25.9 pg — ABNORMAL LOW (ref 26.0–34.0)
MCHC: 31.8 g/dL (ref 30.0–36.0)
MCV: 81.4 fL (ref 78.0–100.0)
Platelets: 207 K/uL (ref 150–400)
RBC: 5.17 MIL/uL (ref 4.22–5.81)
RDW: 13.6 % (ref 11.5–15.5)
WBC: 6.3 K/uL (ref 4.0–10.5)

## 2015-06-28 LAB — COMPREHENSIVE METABOLIC PANEL WITH GFR
ALT: 24 U/L (ref 17–63)
AST: 23 U/L (ref 15–41)
Albumin: 3.6 g/dL (ref 3.5–5.0)
Alkaline Phosphatase: 88 U/L (ref 38–126)
Anion gap: 7 (ref 5–15)
BUN: 15 mg/dL (ref 6–20)
CO2: 22 mmol/L (ref 22–32)
Calcium: 9.8 mg/dL (ref 8.9–10.3)
Chloride: 112 mmol/L — ABNORMAL HIGH (ref 101–111)
Creatinine, Ser: 0.94 mg/dL (ref 0.61–1.24)
GFR calc Af Amer: >60 mL/min
GFR calc non Af Amer: >60 mL/min
Glucose, Bld: 114 mg/dL — ABNORMAL HIGH (ref 65–99)
Potassium: 4 mmol/L (ref 3.5–5.1)
Sodium: 141 mmol/L (ref 135–145)
Total Bilirubin: 0.5 mg/dL (ref 0.3–1.2)
Total Protein: 6.7 g/dL (ref 6.5–8.1)

## 2015-06-28 LAB — GLUCOSE, CAPILLARY: Glucose-Capillary: 116 mg/dL — ABNORMAL HIGH (ref 65–99)

## 2015-06-28 LAB — PROTIME-INR
INR: 1.05 (ref 0.00–1.49)
Prothrombin Time: 13.9 s (ref 11.6–15.2)

## 2015-06-28 LAB — SURGICAL PCR SCREEN
MRSA, PCR: NEGATIVE
Staphylococcus aureus: NEGATIVE

## 2015-06-28 LAB — ABO/RH: ABO/RH(D): O POS

## 2015-06-28 LAB — APTT: aPTT: 36 seconds (ref 24–37)

## 2015-06-28 MED ORDER — ALBUTEROL SULFATE (2.5 MG/3ML) 0.083% IN NEBU
2.5000 mg | INHALATION_SOLUTION | Freq: Once | RESPIRATORY_TRACT | Status: AC
  Administered 2015-06-28: 2.5 mg via RESPIRATORY_TRACT

## 2015-06-28 NOTE — Progress Notes (Signed)
PCP: Mayra Neer Cardiologist: Daneen Schick III  EKG: 06/08/15 Chest Xray: 06/28/15 pending PFTs and Dopplars completed 06/28/15  Pt with no complaints of cough, shortness of breath, fever or chest pain.   Spoke with Thurmond Butts from Dr. Lucianne Lei Trigt's office, instructed pt to continue to take Aspirin, but not to take day of surgery. Pt verbalized understanding.

## 2015-06-28 NOTE — Progress Notes (Signed)
Pre-op Cardiac Surgery  Carotid Findings:  There is no obvious evidence of hemodynamically significant internal carotid artery stenosis bilaterally. Vertebral arteries are patent with antegrade flow.   Upper Extremity Right Left  Brachial Pressures 134-Triphasic 141-Triphasic  Radial Waveforms Triphasic Triphasic  Ulnar Waveforms Triphasic Triphasic  Palmar Arch (Allen's Test) Within normal limits Within normal limits.    Lower  Extremity Right Left  Dorsalis Pedis 123-Triphasic 124-Triphasic  Anterior Tibial    Posterior Tibial 148-Triphasic 163-Triphasic  Ankle/Brachial Indices 1.05 1.16    Findings:   Bilateral ABIs are within normal limits.  06/28/2015 11:06 AM Maudry Mayhew, RVT, RDCS, RDMS

## 2015-06-28 NOTE — Pre-Procedure Instructions (Addendum)
Joseph Sanford  06/28/2015      [facility] - [place] [address]), [place] - [place] [id number] [name] [address] Phone: [phone] Fax: [phone]    Your procedure is scheduled on Tuesday, July 04, 2015 .  Report to [facility] Admitting at 5:30 A.M.   Call this number if you have problems the morning of surgery:  [phone]   Remember:  Do not eat food or drink liquids after midnight.  DO NOT TAKE ASPIRIN DAY OF SURGERY    Take these medicines the morning of surgery with A SIP OF WATER: amlodipine (norvasc), metoprolol (Toprol-XL), nitroglycerin if needed  STOP taking any: Aleve, Naproxen, Ibuprofen, Motrin, Advil, Goody's, BC's, all herbal medications, fish oil, and all vitamins  WHAT DO I DO ABOUT MY DIABETES MEDICATION?  . THE MORNING OF SURGERY, take 20 units of Lantus insulin (half of normal dose)     How to Manage Your Diabetes Before and After Surgery  Why is it important to control my blood sugar before and after surgery? . Improving blood sugar levels before and after surgery helps healing and can limit problems. . A way of improving blood sugar control is eating a healthy diet by: o  Eating less sugar and carbohydrates o  Increasing activity/exercise o  Talking with your doctor about reaching your blood sugar goals . High blood sugars (greater than 180 mg/dL) can raise your risk of infections and slow your recovery, so you will need to focus on controlling your diabetes during the weeks before surgery. . Make sure that the doctor who takes care of your diabetes knows about your planned surgery including the date and location.  How do I manage my blood sugar before surgery? . Check your blood sugar at least 4 times a day, starting 2 days before surgery, to make sure that the level is not too high or low. o Check your blood sugar the morning of your surgery when you wake up and every 2 hours until you get to  the Short Stay unit. . If your blood sugar is less than 70 mg/dL, you will need to treat for low blood sugar: o Do not take insulin. o Treat a low blood sugar (less than 70 mg/dL) with  cup of clear juice (cranberry or apple), 4 glucose tablets, OR glucose gel. o Recheck blood sugar in 15 minutes after treatment (to make sure it is greater than 70 mg/dL). If your blood sugar is not greater than 70 mg/dL on recheck, call [phone] for further instructions. . Report your blood sugar to the short stay nurse when you get to Short Stay.  . If you are admitted to the hospital after surgery: o Your blood sugar will be checked by the staff and you will probably be given insulin after surgery (instead of oral diabetes medicines) to make sure you have good blood sugar levels. o The goal for blood sugar control after surgery is 80-180 mg/dL.               Do not wear jewelry, make-up or nail polish.  Do not wear lotions, powders, or perfumes.   Do not  Men may shave face and neck.  Do not bring valuables to the hospital.  [facility] is not responsible for any belongings or valuables.  Contacts, dentures or bridgework may not be worn into surgery.  Leave your suitcase in the car.  After surgery it may  be brought to your room.  For patients admitted to the hospital, discharge time will be determined by your treatment team.  Patients discharged the day of surgery will not be allowed to drive home.    [place]- Preparing For Surgery  Before surgery, you can play an important role. Because skin is not sterile, your skin needs to be as free of germs as possible. You can reduce the number of germs on your skin by washing with CHG (chlorahexidine gluconate) Soap before surgery.  CHG is an antiseptic cleaner which kills germs and bonds with the skin to continue killing germs even after washing.  Please do not use if you have an allergy to CHG or antibacterial soaps. If your skin becomes  reddened/irritated stop using the CHG.  Do not shave (including legs and underarms) for at least 48 hours prior to first CHG shower. It is OK to shave your face.  Please follow these instructions carefully.   1. Shower the NIGHT BEFORE SURGERY and the MORNING OF SURGERY with CHG.   2. If you chose to wash your hair, wash your hair first as usual with your normal shampoo.  3. After you shampoo, rinse your hair and body thoroughly to remove the shampoo.  4. Use CHG as you would any other liquid soap. You can apply CHG directly to the skin and wash gently with a scrungie or a clean washcloth.   5. Apply the CHG Soap to your body ONLY FROM THE NECK DOWN.  Do not use on open wounds or open sores. Avoid contact with your eyes, ears, mouth and genitals (private parts). Wash genitals (private parts) with your normal soap.  6. Wash thoroughly, paying special attention to the area where your surgery will be performed.  7. Thoroughly rinse your body with warm water from the neck down.  8. DO NOT shower/wash with your normal soap after using and rinsing off the CHG Soap.  9. Pat yourself dry with a CLEAN TOWEL.   10. Wear CLEAN PAJAMAS   11. Place CLEAN SHEETS on your bed the night of your first shower and DO NOT SLEEP WITH PETS.    Day of Surgery: Do not apply any deodorants/lotions. Please wear clean clothes to the hospital/surgery center.     Please read over the following fact sheets that you were given. Coughing and Deep Breathing, MRSA Information and Surgical Site Infection Prevention

## 2015-06-29 LAB — HEMOGLOBIN A1C
Hgb A1c MFr Bld: 9.7 % — ABNORMAL HIGH (ref 4.8–5.6)
Mean Plasma Glucose: 232 mg/dL

## 2015-07-03 MED ORDER — PLASMA-LYTE 148 IV SOLN
INTRAVENOUS | Status: AC
  Administered 2015-07-04: 500 mL
  Filled 2015-07-03: qty 2.5

## 2015-07-03 MED ORDER — PHENYLEPHRINE HCL 10 MG/ML IJ SOLN
30.0000 ug/min | INTRAMUSCULAR | Status: DC
  Filled 2015-07-03: qty 2

## 2015-07-03 MED ORDER — DEXTROSE 5 % IV SOLN
750.0000 mg | INTRAVENOUS | Status: DC
  Filled 2015-07-03: qty 750

## 2015-07-03 MED ORDER — CHLORHEXIDINE GLUCONATE 0.12 % MT SOLN
15.0000 mL | Freq: Once | OROMUCOSAL | Status: AC
  Administered 2015-07-04: 15 mL via OROMUCOSAL
  Filled 2015-07-03: qty 15

## 2015-07-03 MED ORDER — METOPROLOL TARTRATE 12.5 MG HALF TABLET: 12.5000 mg | ORAL_TABLET | Freq: Once | ORAL | Status: DC

## 2015-07-03 MED ORDER — NITROGLYCERIN IN D5W 200-5 MCG/ML-% IV SOLN
2.0000 ug/min | INTRAVENOUS | Status: DC
  Filled 2015-07-03: qty 250

## 2015-07-03 MED ORDER — DEXMEDETOMIDINE HCL IN NACL 400 MCG/100ML IV SOLN
0.1000 ug/kg/h | INTRAVENOUS | Status: AC
  Administered 2015-07-04: .3 ug/kg/h via INTRAVENOUS
  Filled 2015-07-03: qty 100

## 2015-07-03 MED ORDER — SODIUM CHLORIDE 0.9 % IV SOLN
INTRAVENOUS | Status: AC
  Administered 2015-07-04: 69.8 mL/h via INTRAVENOUS
  Filled 2015-07-03: qty 40

## 2015-07-03 MED ORDER — SODIUM CHLORIDE 0.9 % IV SOLN
INTRAVENOUS | Status: AC
  Administered 2015-07-04: .8 [IU]/h via INTRAVENOUS
  Filled 2015-07-03: qty 2.5

## 2015-07-03 MED ORDER — DEXTROSE 5 % IV SOLN
1.5000 g | INTRAVENOUS | Status: AC
  Administered 2015-07-04: .75 g via INTRAVENOUS
  Administered 2015-07-04: 1.5 g via INTRAVENOUS
  Filled 2015-07-03 (×2): qty 1.5

## 2015-07-03 MED ORDER — POTASSIUM CHLORIDE 2 MEQ/ML IV SOLN
80.0000 meq | INTRAVENOUS | Status: DC
  Filled 2015-07-03: qty 40

## 2015-07-03 MED ORDER — DOPAMINE-DEXTROSE 3.2-5 MG/ML-% IV SOLN
0.0000 ug/kg/min | INTRAVENOUS | Status: AC
  Administered 2015-07-04: 3 ug/kg/min via INTRAVENOUS
  Filled 2015-07-03: qty 250

## 2015-07-03 MED ORDER — DEXTROSE 5 % IV SOLN
0.0000 ug/min | INTRAVENOUS | Status: DC
  Filled 2015-07-03: qty 4

## 2015-07-03 MED ORDER — MAGNESIUM SULFATE 50 % IJ SOLN
40.0000 meq | INTRAMUSCULAR | Status: DC
  Filled 2015-07-03: qty 10

## 2015-07-03 MED ORDER — VANCOMYCIN HCL 10 G IV SOLR
1250.0000 mg | INTRAVENOUS | Status: AC
  Administered 2015-07-04: 1250 mg via INTRAVENOUS
  Filled 2015-07-03: qty 1250

## 2015-07-03 MED ORDER — SODIUM CHLORIDE 0.9 % IV SOLN
INTRAVENOUS | Status: DC
  Filled 2015-07-03: qty 30

## 2015-07-04 ENCOUNTER — Inpatient Hospital Stay (HOSPITAL_COMMUNITY): Payer: Commercial Managed Care - HMO | Admitting: Certified Registered"

## 2015-07-04 ENCOUNTER — Inpatient Hospital Stay (HOSPITAL_COMMUNITY): Payer: Commercial Managed Care - HMO

## 2015-07-04 ENCOUNTER — Encounter (HOSPITAL_COMMUNITY): Payer: Self-pay | Admitting: Surgery

## 2015-07-04 ENCOUNTER — Encounter (HOSPITAL_COMMUNITY)
Admission: RE | Disposition: A | Discharge status: Skilled Nursing Facility | Payer: Self-pay | Source: Ambulatory Visit | Attending: Cardiothoracic Surgery

## 2015-07-04 ENCOUNTER — Inpatient Hospital Stay (HOSPITAL_COMMUNITY)
Admission: RE | Admit: 2015-07-04 | Discharge: 2015-07-12 | DRG: 236 | Disposition: A | Discharge status: Skilled Nursing Facility | Payer: Commercial Managed Care - HMO | Source: Ambulatory Visit | Attending: Cardiothoracic Surgery | Admitting: Cardiothoracic Surgery

## 2015-07-04 DIAGNOSIS — D696 Thrombocytopenia, unspecified: Secondary | ICD-10-CM | POA: Diagnosis not present

## 2015-07-04 DIAGNOSIS — Z951 Presence of aortocoronary bypass graft: Secondary | ICD-10-CM

## 2015-07-04 DIAGNOSIS — D62 Acute posthemorrhagic anemia: Secondary | ICD-10-CM | POA: Diagnosis not present

## 2015-07-04 DIAGNOSIS — T814XXD Infection following a procedure, subsequent encounter: Secondary | ICD-10-CM | POA: Diagnosis not present

## 2015-07-04 DIAGNOSIS — I2510 Atherosclerotic heart disease of native coronary artery without angina pectoris: Secondary | ICD-10-CM

## 2015-07-04 DIAGNOSIS — I25119 Atherosclerotic heart disease of native coronary artery with unspecified angina pectoris: Principal | ICD-10-CM | POA: Diagnosis present

## 2015-07-04 DIAGNOSIS — J9811 Atelectasis: Secondary | ICD-10-CM | POA: Diagnosis not present

## 2015-07-04 DIAGNOSIS — E785 Hyperlipidemia, unspecified: Secondary | ICD-10-CM | POA: Diagnosis not present

## 2015-07-04 DIAGNOSIS — Z79899 Other long term (current) drug therapy: Secondary | ICD-10-CM

## 2015-07-04 DIAGNOSIS — R079 Chest pain, unspecified: Secondary | ICD-10-CM | POA: Diagnosis present

## 2015-07-04 DIAGNOSIS — I499 Cardiac arrhythmia, unspecified: Secondary | ICD-10-CM | POA: Diagnosis not present

## 2015-07-04 DIAGNOSIS — I080 Rheumatic disorders of both mitral and aortic valves: Secondary | ICD-10-CM | POA: Diagnosis not present

## 2015-07-04 DIAGNOSIS — E1122 Type 2 diabetes mellitus with diabetic chronic kidney disease: Secondary | ICD-10-CM | POA: Diagnosis not present

## 2015-07-04 DIAGNOSIS — N189 Chronic kidney disease, unspecified: Secondary | ICD-10-CM | POA: Diagnosis not present

## 2015-07-04 DIAGNOSIS — E8770 Fluid overload, unspecified: Secondary | ICD-10-CM | POA: Diagnosis not present

## 2015-07-04 DIAGNOSIS — M6281 Muscle weakness (generalized): Secondary | ICD-10-CM | POA: Diagnosis not present

## 2015-07-04 DIAGNOSIS — Z8673 Personal history of transient ischemic attack (TIA), and cerebral infarction without residual deficits: Secondary | ICD-10-CM

## 2015-07-04 DIAGNOSIS — I25110 Atherosclerotic heart disease of native coronary artery with unstable angina pectoris: Secondary | ICD-10-CM

## 2015-07-04 DIAGNOSIS — Z48812 Encounter for surgical aftercare following surgery on the circulatory system: Secondary | ICD-10-CM | POA: Diagnosis not present

## 2015-07-04 DIAGNOSIS — N183 Chronic kidney disease, stage 3 (moderate): Secondary | ICD-10-CM | POA: Diagnosis present

## 2015-07-04 DIAGNOSIS — Z955 Presence of coronary angioplasty implant and graft: Secondary | ICD-10-CM

## 2015-07-04 DIAGNOSIS — I252 Old myocardial infarction: Secondary | ICD-10-CM

## 2015-07-04 DIAGNOSIS — R2681 Unsteadiness on feet: Secondary | ICD-10-CM | POA: Diagnosis not present

## 2015-07-04 HISTORY — PX: TEE WITHOUT CARDIOVERSION: SHX5443

## 2015-07-04 HISTORY — PX: CORONARY ARTERY BYPASS GRAFT: SHX141

## 2015-07-04 LAB — POCT I-STAT, CHEM 8
BUN: 13 mg/dL (ref 6–20)
BUN: 10 mg/dL (ref 6–20)
BUN: 13 mg/dL (ref 6–20)
BUN: 11 mg/dL (ref 6–20)
BUN: 11 mg/dL (ref 6–20)
BUN: 11 mg/dL (ref 6–20)
BUN: 11 mg/dL (ref 6–20)
Calcium, Ion: 1.24 mmol/L (ref 1.13–1.30)
Calcium, Ion: 1.05 mmol/L — ABNORMAL LOW (ref 1.13–1.30)
Calcium, Ion: 1.27 mmol/L (ref 1.13–1.30)
Calcium, Ion: 1.13 mmol/L (ref 1.13–1.30)
Calcium, Ion: 1.14 mmol/L (ref 1.13–1.30)
Calcium, Ion: 1.11 mmol/L — ABNORMAL LOW (ref 1.13–1.30)
Calcium, Ion: 1.22 mmol/L (ref 1.13–1.30)
Chloride: 106 mmol/L (ref 101–111)
Chloride: 100 mmol/L — ABNORMAL LOW (ref 101–111)
Chloride: 105 mmol/L (ref 101–111)
Chloride: 102 mmol/L (ref 101–111)
Chloride: 103 mmol/L (ref 101–111)
Chloride: 103 mmol/L (ref 101–111)
Chloride: 106 mmol/L (ref 101–111)
Creatinine, Ser: 0.7 mg/dL (ref 0.61–1.24)
Creatinine, Ser: 0.6 mg/dL — ABNORMAL LOW (ref 0.61–1.24)
Creatinine, Ser: 0.7 mg/dL (ref 0.61–1.24)
Creatinine, Ser: 0.7 mg/dL (ref 0.61–1.24)
Creatinine, Ser: 0.8 mg/dL (ref 0.61–1.24)
Creatinine, Ser: 0.6 mg/dL — ABNORMAL LOW (ref 0.61–1.24)
Creatinine, Ser: 0.9 mg/dL (ref 0.61–1.24)
Glucose, Bld: 98 mg/dL (ref 65–99)
Glucose, Bld: 105 mg/dL — ABNORMAL HIGH (ref 65–99)
Glucose, Bld: 86 mg/dL (ref 65–99)
Glucose, Bld: 97 mg/dL (ref 65–99)
Glucose, Bld: 102 mg/dL — ABNORMAL HIGH (ref 65–99)
Glucose, Bld: 198 mg/dL — ABNORMAL HIGH (ref 65–99)
Glucose, Bld: 104 mg/dL — ABNORMAL HIGH (ref 65–99)
HCT: 33 % — ABNORMAL LOW (ref 39.0–52.0)
HCT: 24 % — ABNORMAL LOW (ref 39.0–52.0)
HCT: 32 % — ABNORMAL LOW (ref 39.0–52.0)
HCT: 26 % — ABNORMAL LOW (ref 39.0–52.0)
HCT: 26 % — ABNORMAL LOW (ref 39.0–52.0)
HCT: 26 % — ABNORMAL LOW (ref 39.0–52.0)
HCT: 32 % — ABNORMAL LOW (ref 39.0–52.0)
Hemoglobin: 11.2 g/dL — ABNORMAL LOW (ref 13.0–17.0)
Hemoglobin: 10.9 g/dL — ABNORMAL LOW (ref 13.0–17.0)
Hemoglobin: 8.2 g/dL — ABNORMAL LOW (ref 13.0–17.0)
Hemoglobin: 8.8 g/dL — ABNORMAL LOW (ref 13.0–17.0)
Hemoglobin: 8.8 g/dL — ABNORMAL LOW (ref 13.0–17.0)
Hemoglobin: 8.8 g/dL — ABNORMAL LOW (ref 13.0–17.0)
Hemoglobin: 10.9 g/dL — ABNORMAL LOW (ref 13.0–17.0)
Potassium: 3.6 mmol/L (ref 3.5–5.1)
Potassium: 3.8 mmol/L (ref 3.5–5.1)
Potassium: 3.8 mmol/L (ref 3.5–5.1)
Potassium: 4.6 mmol/L (ref 3.5–5.1)
Potassium: 4.5 mmol/L (ref 3.5–5.1)
Potassium: 4 mmol/L (ref 3.5–5.1)
Potassium: 4.1 mmol/L (ref 3.5–5.1)
Sodium: 142 mmol/L (ref 135–145)
Sodium: 138 mmol/L (ref 135–145)
Sodium: 141 mmol/L (ref 135–145)
Sodium: 139 mmol/L (ref 135–145)
Sodium: 138 mmol/L (ref 135–145)
Sodium: 139 mmol/L (ref 135–145)
Sodium: 142 mmol/L (ref 135–145)
TCO2: 28 mmol/L (ref 0–100)
TCO2: 27 mmol/L (ref 0–100)
TCO2: 29 mmol/L (ref 0–100)
TCO2: 25 mmol/L (ref 0–100)
TCO2: 26 mmol/L (ref 0–100)
TCO2: 27 mmol/L (ref 0–100)
TCO2: 26 mmol/L (ref 0–100)

## 2015-07-04 LAB — CREATININE, SERUM
Creatinine, Ser: 0.91 mg/dL (ref 0.61–1.24)
GFR calc Af Amer: >60 mL/min (ref >60)
GFR calc non Af Amer: >60 mL/min (ref >60)

## 2015-07-04 LAB — POCT I-STAT 3, ART BLOOD GAS (G3+)
Acid-Base Excess: 3 mmol/L — ABNORMAL HIGH (ref 0.0–2.0)
Acid-Base Excess: 2 mmol/L (ref 0.0–2.0)
Acid-base deficit: 1 mmol/L (ref 0.0–2.0)
Bicarbonate: 26.8 mEq/L — ABNORMAL HIGH (ref 20.0–24.0)
Bicarbonate: 26.7 mEq/L — ABNORMAL HIGH (ref 20.0–24.0)
Bicarbonate: 24.5 mEq/L — ABNORMAL HIGH (ref 20.0–24.0)
Bicarbonate: 24.5 mEq/L — ABNORMAL HIGH (ref 20.0–24.0)
O2 Saturation: 100 %
O2 Saturation: 100 %
O2 Saturation: 99 %
O2 Saturation: 98 %
Patient temperature: 36.5
Patient temperature: 36.7
TCO2: 28 mmol/L (ref 0–100)
TCO2: 28 mmol/L (ref 0–100)
TCO2: 26 mmol/L (ref 0–100)
TCO2: 26 mmol/L (ref 0–100)
pCO2 arterial: 36.9 mmHg (ref 35.0–45.0)
pCO2 arterial: 42 mmHg (ref 35.0–45.0)
pCO2 arterial: 38.8 mmHg (ref 35.0–45.0)
pCO2 arterial: 41.5 mmHg (ref 35.0–45.0)
pH, Arterial: 7.468 — ABNORMAL HIGH (ref 7.350–7.450)
pH, Arterial: 7.411 (ref 7.350–7.450)
pH, Arterial: 7.406 (ref 7.350–7.450)
pH, Arterial: 7.377 (ref 7.350–7.450)
pO2, Arterial: 327 mmHg — ABNORMAL HIGH (ref 80.0–100.0)
pO2, Arterial: 400 mmHg — ABNORMAL HIGH (ref 80.0–100.0)
pO2, Arterial: 142 mmHg — ABNORMAL HIGH (ref 80.0–100.0)
pO2, Arterial: 103 mmHg — ABNORMAL HIGH (ref 80.0–100.0)

## 2015-07-04 LAB — MAGNESIUM: Magnesium: 2.7 mg/dL — ABNORMAL HIGH (ref 1.7–2.4)

## 2015-07-04 LAB — CBC
HCT: 34 % — ABNORMAL LOW (ref 39.0–52.0)
HCT: 33.2 % — ABNORMAL LOW (ref 39.0–52.0)
Hemoglobin: 10.8 g/dL — ABNORMAL LOW (ref 13.0–17.0)
Hemoglobin: 10.5 g/dL — ABNORMAL LOW (ref 13.0–17.0)
MCH: 25.6 pg — ABNORMAL LOW (ref 26.0–34.0)
MCH: 25.5 pg — ABNORMAL LOW (ref 26.0–34.0)
MCHC: 31.8 g/dL (ref 30.0–36.0)
MCHC: 31.6 g/dL (ref 30.0–36.0)
MCV: 80.6 fL (ref 78.0–100.0)
MCV: 80.6 fL (ref 78.0–100.0)
PLATELETS: 120 10*3/uL — AB (ref 150–400)
Platelets: 131 10*3/uL — ABNORMAL LOW (ref 150–400)
RBC: 4.22 MIL/uL (ref 4.22–5.81)
RBC: 4.12 MIL/uL — ABNORMAL LOW (ref 4.22–5.81)
RDW: 13.6 % (ref 11.5–15.5)
RDW: 13.7 % (ref 11.5–15.5)
WBC: 15.6 10*3/uL — AB (ref 4.0–10.5)
WBC: 17.4 10*3/uL — ABNORMAL HIGH (ref 4.0–10.5)

## 2015-07-04 LAB — HEMOGLOBIN AND HEMATOCRIT, BLOOD
HCT: 25.3 % — ABNORMAL LOW (ref 39.0–52.0)
Hemoglobin: 8.3 g/dL — ABNORMAL LOW (ref 13.0–17.0)

## 2015-07-04 LAB — GLUCOSE, CAPILLARY
Glucose-Capillary: 81 mg/dL (ref 65–99)
Glucose-Capillary: 147 mg/dL — ABNORMAL HIGH (ref 65–99)
Glucose-Capillary: 128 mg/dL — ABNORMAL HIGH (ref 65–99)
Glucose-Capillary: 100 mg/dL — ABNORMAL HIGH (ref 65–99)
Glucose-Capillary: 100 mg/dL — ABNORMAL HIGH (ref 65–99)
Glucose-Capillary: 129 mg/dL — ABNORMAL HIGH (ref 65–99)
Glucose-Capillary: 114 mg/dL — ABNORMAL HIGH (ref 65–99)

## 2015-07-04 LAB — POCT I-STAT 4, (NA,K, GLUC, HGB,HCT)
Glucose, Bld: 157 mg/dL — ABNORMAL HIGH (ref 65–99)
HCT: 33 % — ABNORMAL LOW (ref 39.0–52.0)
Hemoglobin: 11.2 g/dL — ABNORMAL LOW (ref 13.0–17.0)
Potassium: 3.6 mmol/L (ref 3.5–5.1)
Sodium: 138 mmol/L (ref 135–145)

## 2015-07-04 LAB — PROTIME-INR
INR: 1.43 (ref 0.00–1.49)
Prothrombin Time: 17.6 seconds — ABNORMAL HIGH (ref 11.6–15.2)

## 2015-07-04 LAB — PLATELET COUNT: Platelets: 134 10*3/uL — ABNORMAL LOW (ref 150–400)

## 2015-07-04 LAB — APTT: APTT: 33 s (ref 24–37)

## 2015-07-04 SURGERY — CORONARY ARTERY BYPASS GRAFTING (CABG)
Anesthesia: General | Site: Chest

## 2015-07-04 MED ORDER — ACETAMINOPHEN 500 MG PO TABS
1000.0000 mg | ORAL_TABLET | Freq: Four times a day (QID) | ORAL | Status: DC
  Administered 2015-07-05 – 2015-07-09 (×16): 1000 mg via ORAL
  Filled 2015-07-04 (×16): qty 2

## 2015-07-04 MED ORDER — DICLOFENAC SODIUM 75 MG PO TBEC
75.0000 mg | DELAYED_RELEASE_TABLET | Freq: Every day | ORAL | Status: DC | PRN
  Filled 2015-07-04: qty 1

## 2015-07-04 MED ORDER — SODIUM CHLORIDE 0.9 % IV SOLN
INTRAVENOUS | Status: DC | PRN
  Administered 2015-07-04: 13:00:00 via INTRAVENOUS

## 2015-07-04 MED ORDER — SODIUM CHLORIDE 0.9% FLUSH: 3.0000 mL | INTRAVENOUS | Status: DC | PRN

## 2015-07-04 MED ORDER — ARTIFICIAL TEARS OP OINT
TOPICAL_OINTMENT | OPHTHALMIC | Status: AC
  Filled 2015-07-04: qty 3.5

## 2015-07-04 MED ORDER — PHENYLEPHRINE HCL 10 MG/ML IJ SOLN: 10000.0000 ug | INTRAMUSCULAR | Status: DC | PRN

## 2015-07-04 MED ORDER — PHENYLEPHRINE HCL 10 MG/ML IJ SOLN
INTRAMUSCULAR | Status: DC | PRN
  Administered 2015-07-04 (×2): 80 ug via INTRAVENOUS

## 2015-07-04 MED ORDER — METOPROLOL TARTRATE 25 MG/10 ML ORAL SUSPENSION: 12.5000 mg | Freq: Two times a day (BID) | ORAL | Status: DC

## 2015-07-04 MED ORDER — SODIUM CHLORIDE 0.9 % IV SOLN
INTRAVENOUS | Status: DC
  Administered 2015-07-04: 14:00:00 via INTRAVENOUS

## 2015-07-04 MED ORDER — ACETAMINOPHEN 650 MG RE SUPP: 650.0000 mg | Freq: Once | RECTAL | Status: AC

## 2015-07-04 MED ORDER — DEXTROSE 5 % IV SOLN
10.0000 mg | INTRAVENOUS | Status: DC | PRN
  Administered 2015-07-04: 20 ug/min via INTRAVENOUS

## 2015-07-04 MED ORDER — SODIUM CHLORIDE 0.45 % IV SOLN
INTRAVENOUS | Status: DC | PRN
  Administered 2015-07-04: 14:00:00 via INTRAVENOUS

## 2015-07-04 MED ORDER — ACETAMINOPHEN 160 MG/5ML PO SOLN: 1000.0000 mg | Freq: Four times a day (QID) | ORAL | Status: DC

## 2015-07-04 MED ORDER — CETYLPYRIDINIUM CHLORIDE 0.05 % MT LIQD
7.0000 mL | Freq: Two times a day (BID) | OROMUCOSAL | Status: DC
  Administered 2015-07-04 – 2015-07-12 (×13): 7 mL via OROMUCOSAL

## 2015-07-04 MED ORDER — ROCURONIUM BROMIDE 100 MG/10ML IV SOLN
INTRAVENOUS | Status: DC | PRN
  Administered 2015-07-04 (×2): 50 mg via INTRAVENOUS
  Administered 2015-07-04: 100 mg via INTRAVENOUS

## 2015-07-04 MED ORDER — ROCURONIUM BROMIDE 50 MG/5ML IV SOLN
INTRAVENOUS | Status: AC
  Filled 2015-07-04: qty 1

## 2015-07-04 MED ORDER — SUCCINYLCHOLINE CHLORIDE 200 MG/10ML IV SOSY
PREFILLED_SYRINGE | INTRAVENOUS | Status: AC
  Filled 2015-07-04: qty 10

## 2015-07-04 MED ORDER — POTASSIUM CHLORIDE 10 MEQ/50ML IV SOLN
10.0000 meq | INTRAVENOUS | Status: AC
  Administered 2015-07-04 (×3): 10 meq via INTRAVENOUS

## 2015-07-04 MED ORDER — HEMOSTATIC AGENTS (NO CHARGE) OPTIME
TOPICAL | Status: DC | PRN
  Administered 2015-07-04: 1 via TOPICAL

## 2015-07-04 MED ORDER — BISACODYL 10 MG RE SUPP
10.0000 mg | Freq: Every day | RECTAL | Status: DC
  Filled 2015-07-04: qty 1

## 2015-07-04 MED ORDER — MIDAZOLAM HCL 10 MG/2ML IJ SOLN
INTRAMUSCULAR | Status: AC
  Filled 2015-07-04: qty 4

## 2015-07-04 MED ORDER — INSULIN REGULAR BOLUS VIA INFUSION
0.0000 [IU] | Freq: Three times a day (TID) | INTRAVENOUS | Status: DC
  Filled 2015-07-04: qty 10

## 2015-07-04 MED ORDER — FAMOTIDINE IN NACL 20-0.9 MG/50ML-% IV SOLN
20.0000 mg | Freq: Two times a day (BID) | INTRAVENOUS | Status: AC
  Administered 2015-07-04: 20 mg via INTRAVENOUS

## 2015-07-04 MED ORDER — ACETAMINOPHEN 160 MG/5ML PO SOLN
650.0000 mg | Freq: Once | ORAL | Status: AC
  Administered 2015-07-04: 650 mg

## 2015-07-04 MED ORDER — CHLORHEXIDINE GLUCONATE 4 % EX LIQD
30.0000 mL | CUTANEOUS | Status: DC
  Administered 2015-07-07: 2 via TOPICAL
  Filled 2015-07-04: qty 30

## 2015-07-04 MED ORDER — FOLIC ACID 1 MG PO TABS
1.0000 mg | ORAL_TABLET | Freq: Every day | ORAL | Status: DC
  Administered 2015-07-05 – 2015-07-12 (×8): 1 mg via ORAL
  Filled 2015-07-04 (×8): qty 1

## 2015-07-04 MED ORDER — METOCLOPRAMIDE HCL 5 MG/ML IJ SOLN
10.0000 mg | Freq: Four times a day (QID) | INTRAMUSCULAR | Status: AC
  Administered 2015-07-04 – 2015-07-05 (×3): 10 mg via INTRAVENOUS
  Filled 2015-07-04 (×3): qty 2

## 2015-07-04 MED ORDER — LACTATED RINGERS IV SOLN: INTRAVENOUS | Status: DC

## 2015-07-04 MED ORDER — MIDAZOLAM HCL 2 MG/2ML IJ SOLN: 2.0000 mg | INTRAMUSCULAR | Status: DC | PRN

## 2015-07-04 MED ORDER — BISACODYL 5 MG PO TBEC
10.0000 mg | DELAYED_RELEASE_TABLET | Freq: Every day | ORAL | Status: DC
  Administered 2015-07-05 – 2015-07-12 (×6): 10 mg via ORAL
  Filled 2015-07-04 (×7): qty 2

## 2015-07-04 MED ORDER — SODIUM CHLORIDE 0.9 % IJ SOLN
OROMUCOSAL | Status: DC | PRN
  Administered 2015-07-04 (×3): 4 mL via TOPICAL

## 2015-07-04 MED ORDER — DEXTROSE 5 % IV SOLN
1.5000 g | Freq: Two times a day (BID) | INTRAVENOUS | Status: AC
  Administered 2015-07-04 – 2015-07-06 (×4): 1.5 g via INTRAVENOUS
  Filled 2015-07-04 (×4): qty 1.5

## 2015-07-04 MED ORDER — ASPIRIN 81 MG PO CHEW
324.0000 mg | CHEWABLE_TABLET | Freq: Every day | ORAL | Status: DC
  Administered 2015-07-11: 324 mg
  Filled 2015-07-04 (×2): qty 4

## 2015-07-04 MED ORDER — DOCUSATE SODIUM 100 MG PO CAPS
200.0000 mg | ORAL_CAPSULE | Freq: Every day | ORAL | Status: DC
  Administered 2015-07-05 – 2015-07-12 (×6): 200 mg via ORAL
  Filled 2015-07-04 (×7): qty 2

## 2015-07-04 MED ORDER — SODIUM CHLORIDE 0.9 % IV SOLN: 250.0000 mL | INTRAVENOUS | Status: DC

## 2015-07-04 MED ORDER — PROTAMINE SULFATE 10 MG/ML IV SOLN
INTRAVENOUS | Status: DC | PRN
  Administered 2015-07-04: 210 mg via INTRAVENOUS

## 2015-07-04 MED ORDER — CHLORHEXIDINE GLUCONATE 0.12 % MT SOLN
15.0000 mL | OROMUCOSAL | Status: AC
  Administered 2015-07-04: 15 mL via OROMUCOSAL
  Filled 2015-07-04: qty 15

## 2015-07-04 MED ORDER — EPHEDRINE 5 MG/ML INJ
INTRAVENOUS | Status: AC
  Filled 2015-07-04: qty 10

## 2015-07-04 MED ORDER — ROCURONIUM BROMIDE 50 MG/5ML IV SOLN
INTRAVENOUS | Status: AC
  Filled 2015-07-04: qty 2

## 2015-07-04 MED ORDER — LIDOCAINE 2% (20 MG/ML) 5 ML SYRINGE
INTRAMUSCULAR | Status: DC | PRN
  Administered 2015-07-04: 100 mg via INTRAVENOUS

## 2015-07-04 MED ORDER — VANCOMYCIN HCL IN DEXTROSE 1-5 GM/200ML-% IV SOLN
1000.0000 mg | Freq: Once | INTRAVENOUS | Status: AC
  Administered 2015-07-04: 1000 mg via INTRAVENOUS
  Filled 2015-07-04: qty 200

## 2015-07-04 MED ORDER — PHENYLEPHRINE 40 MCG/ML (10ML) SYRINGE FOR IV PUSH (FOR BLOOD PRESSURE SUPPORT)
PREFILLED_SYRINGE | INTRAVENOUS | Status: AC
  Filled 2015-07-04: qty 10

## 2015-07-04 MED ORDER — PHENYLEPHRINE HCL 10 MG/ML IJ SOLN: 0.0000 ug/min | INTRAVENOUS | Status: DC

## 2015-07-04 MED ORDER — SODIUM CHLORIDE 0.9 % IV SOLN: INTRAVENOUS | Status: DC

## 2015-07-04 MED ORDER — LIDOCAINE 2% (20 MG/ML) 5 ML SYRINGE
INTRAMUSCULAR | Status: AC
  Filled 2015-07-04: qty 5

## 2015-07-04 MED ORDER — PROPOFOL 10 MG/ML IV BOLUS
INTRAVENOUS | Status: AC
  Filled 2015-07-04: qty 20

## 2015-07-04 MED ORDER — HEPARIN SODIUM (PORCINE) 1000 UNIT/ML IJ SOLN
INTRAMUSCULAR | Status: AC
  Filled 2015-07-04: qty 3

## 2015-07-04 MED ORDER — ALBUMIN HUMAN 5 % IV SOLN
250.0000 mL | INTRAVENOUS | Status: AC | PRN
  Administered 2015-07-04 (×3): 250 mL via INTRAVENOUS
  Filled 2015-07-04: qty 250

## 2015-07-04 MED ORDER — DEXMEDETOMIDINE HCL IN NACL 200 MCG/50ML IV SOLN: 0.0000 ug/kg/h | INTRAVENOUS | Status: DC

## 2015-07-04 MED ORDER — MORPHINE SULFATE (PF) 2 MG/ML IV SOLN: 1.0000 mg | INTRAVENOUS | Status: AC | PRN

## 2015-07-04 MED ORDER — MORPHINE SULFATE (PF) 2 MG/ML IV SOLN
2.0000 mg | INTRAVENOUS | Status: DC | PRN
  Administered 2015-07-04 – 2015-07-05 (×5): 4 mg via INTRAVENOUS
  Administered 2015-07-06: 2 mg via INTRAVENOUS
  Administered 2015-07-06: 4 mg via INTRAVENOUS
  Filled 2015-07-04 (×4): qty 2
  Filled 2015-07-04: qty 1
  Filled 2015-07-04 (×2): qty 2

## 2015-07-04 MED ORDER — METOPROLOL TARTRATE 12.5 MG HALF TABLET
12.5000 mg | ORAL_TABLET | Freq: Two times a day (BID) | ORAL | Status: DC
  Administered 2015-07-05 – 2015-07-07 (×6): 12.5 mg via ORAL
  Filled 2015-07-04 (×6): qty 1

## 2015-07-04 MED ORDER — OXYCODONE HCL 5 MG PO TABS
5.0000 mg | ORAL_TABLET | ORAL | Status: DC | PRN
  Administered 2015-07-04 – 2015-07-12 (×14): 10 mg via ORAL
  Filled 2015-07-04 (×14): qty 2

## 2015-07-04 MED ORDER — ASPIRIN EC 325 MG PO TBEC
325.0000 mg | DELAYED_RELEASE_TABLET | Freq: Every day | ORAL | Status: DC
  Administered 2015-07-05 – 2015-07-12 (×7): 325 mg via ORAL
  Filled 2015-07-04 (×7): qty 1

## 2015-07-04 MED ORDER — FENTANYL CITRATE (PF) 100 MCG/2ML IJ SOLN
INTRAMUSCULAR | Status: DC | PRN
  Administered 2015-07-04 (×2): 250 ug via INTRAVENOUS
  Administered 2015-07-04: 500 ug via INTRAVENOUS
  Administered 2015-07-04 (×2): 250 ug via INTRAVENOUS

## 2015-07-04 MED ORDER — ONDANSETRON HCL 4 MG/2ML IJ SOLN
4.0000 mg | Freq: Four times a day (QID) | INTRAMUSCULAR | Status: DC | PRN
  Administered 2015-07-04 – 2015-07-09 (×2): 4 mg via INTRAVENOUS
  Filled 2015-07-04 (×2): qty 2

## 2015-07-04 MED ORDER — TRAMADOL HCL 50 MG PO TABS
50.0000 mg | ORAL_TABLET | ORAL | Status: DC | PRN
  Administered 2015-07-10: 100 mg via ORAL
  Filled 2015-07-04: qty 2

## 2015-07-04 MED ORDER — LACTATED RINGERS IV SOLN
INTRAVENOUS | Status: DC | PRN
  Administered 2015-07-04 (×3): via INTRAVENOUS

## 2015-07-04 MED ORDER — PANTOPRAZOLE SODIUM 40 MG PO TBEC
40.0000 mg | DELAYED_RELEASE_TABLET | Freq: Every day | ORAL | Status: DC
  Administered 2015-07-06 – 2015-07-12 (×7): 40 mg via ORAL
  Filled 2015-07-04 (×7): qty 1

## 2015-07-04 MED ORDER — LACTATED RINGERS IV SOLN
500.0000 mL | Freq: Once | INTRAVENOUS | Status: AC | PRN
  Administered 2015-07-04: 500 mL via INTRAVENOUS

## 2015-07-04 MED ORDER — 0.9 % SODIUM CHLORIDE (POUR BTL) OPTIME
TOPICAL | Status: DC | PRN
  Administered 2015-07-04: 5000 mL

## 2015-07-04 MED ORDER — SODIUM CHLORIDE 0.9% FLUSH
3.0000 mL | Freq: Two times a day (BID) | INTRAVENOUS | Status: DC
  Administered 2015-07-05 – 2015-07-09 (×6): 3 mL via INTRAVENOUS

## 2015-07-04 MED ORDER — FENTANYL CITRATE (PF) 250 MCG/5ML IJ SOLN
INTRAMUSCULAR | Status: AC
  Filled 2015-07-04: qty 30

## 2015-07-04 MED ORDER — DOPAMINE-DEXTROSE 3.2-5 MG/ML-% IV SOLN: 0.0000 ug/kg/min | INTRAVENOUS | Status: DC

## 2015-07-04 MED ORDER — VITAMIN B-12 1000 MCG PO TABS
1000.0000 ug | ORAL_TABLET | Freq: Every day | ORAL | Status: DC
  Administered 2015-07-05 – 2015-07-12 (×8): 1000 ug via ORAL
  Filled 2015-07-04 (×8): qty 1

## 2015-07-04 MED ORDER — MAGNESIUM SULFATE 4 GM/100ML IV SOLN
4.0000 g | Freq: Once | INTRAVENOUS | Status: AC
  Administered 2015-07-04: 4 g via INTRAVENOUS
  Filled 2015-07-04: qty 100

## 2015-07-04 MED ORDER — B COMPLEX-C PO TABS
1.0000 | ORAL_TABLET | Freq: Every day | ORAL | Status: DC
  Administered 2015-07-05 – 2015-07-12 (×8): 1 via ORAL
  Filled 2015-07-04 (×8): qty 1

## 2015-07-04 MED ORDER — METOPROLOL TARTRATE 5 MG/5ML IV SOLN: 2.5000 mg | INTRAVENOUS | Status: DC | PRN

## 2015-07-04 MED ORDER — PROTAMINE SULFATE 10 MG/ML IV SOLN
INTRAVENOUS | Status: AC
  Filled 2015-07-04: qty 25

## 2015-07-04 MED ORDER — HEPARIN SODIUM (PORCINE) 1000 UNIT/ML IJ SOLN
INTRAMUSCULAR | Status: DC | PRN
  Administered 2015-07-04 (×2): 2000 [IU] via INTRAVENOUS
  Administered 2015-07-04: 20000 [IU] via INTRAVENOUS

## 2015-07-04 MED ORDER — MIDAZOLAM HCL 5 MG/5ML IJ SOLN
INTRAMUSCULAR | Status: DC | PRN
  Administered 2015-07-04: 5 mg via INTRAVENOUS
  Administered 2015-07-04: 4 mg via INTRAVENOUS
  Administered 2015-07-04: 2 mg via INTRAVENOUS
  Administered 2015-07-04: 5 mg via INTRAVENOUS
  Administered 2015-07-04: 4 mg via INTRAVENOUS

## 2015-07-04 MED ORDER — NITROGLYCERIN IN D5W 200-5 MCG/ML-% IV SOLN: 0.0000 ug/min | INTRAVENOUS | Status: DC

## 2015-07-04 MED ORDER — ALBUMIN HUMAN 5 % IV SOLN
INTRAVENOUS | Status: DC | PRN
  Administered 2015-07-04: 13:00:00 via INTRAVENOUS

## 2015-07-04 MED ORDER — SIMVASTATIN 40 MG PO TABS
40.0000 mg | ORAL_TABLET | Freq: Every day | ORAL | Status: DC
  Administered 2015-07-05 – 2015-07-11 (×7): 40 mg via ORAL
  Filled 2015-07-04 (×7): qty 1

## 2015-07-04 MED FILL — Lidocaine HCl IV Inj 20 MG/ML: INTRAVENOUS | Qty: 5 | Status: AC

## 2015-07-04 MED FILL — Heparin Sodium (Porcine) Inj 1000 Unit/ML: INTRAMUSCULAR | Qty: 30 | Status: AC

## 2015-07-04 MED FILL — Potassium Chloride Inj 2 mEq/ML: INTRAVENOUS | Qty: 40 | Status: AC

## 2015-07-04 MED FILL — Sodium Chloride IV Soln 0.9%: INTRAVENOUS | Qty: 2000 | Status: AC

## 2015-07-04 MED FILL — Mannitol IV Soln 20%: INTRAVENOUS | Qty: 500 | Status: AC

## 2015-07-04 MED FILL — Magnesium Sulfate Inj 50%: INTRAMUSCULAR | Qty: 10 | Status: AC

## 2015-07-04 MED FILL — Heparin Sodium (Porcine) Inj 1000 Unit/ML: INTRAMUSCULAR | Qty: 10 | Status: AC

## 2015-07-04 MED FILL — Electrolyte-R (PH 7.4) Solution: INTRAVENOUS | Qty: 4000 | Status: AC

## 2015-07-04 MED FILL — Sodium Bicarbonate IV Soln 8.4%: INTRAVENOUS | Qty: 50 | Status: AC

## 2015-07-04 SURGICAL SUPPLY — 107 items
ADAPTER CARDIO PERF ANTE/RETRO (ADAPTER) ×4 IMPLANT
BAG DECANTER FOR FLEXI CONT (MISCELLANEOUS) ×4 IMPLANT
BANDAGE ACE 6X5 VEL STRL LF (GAUZE/BANDAGES/DRESSINGS) ×4 IMPLANT
BANDAGE ELASTIC 4 VELCRO ST LF (GAUZE/BANDAGES/DRESSINGS) ×4 IMPLANT
BANDAGE ELASTIC 6 VELCRO ST LF (GAUZE/BANDAGES/DRESSINGS) ×4 IMPLANT
BASKET HEART  (ORDER IN 25'S) (MISCELLANEOUS) ×1
BASKET HEART (ORDER IN 25'S) (MISCELLANEOUS) ×1
BASKET HEART (ORDER IN 25S) (MISCELLANEOUS) ×2 IMPLANT
BLADE MINI RND TIP GREEN BEAV (BLADE) ×4 IMPLANT
BLADE STERNUM SYSTEM 6 (BLADE) ×4 IMPLANT
BLADE SURG 12 STRL SS (BLADE) ×4 IMPLANT
BLADE SURG ROTATE 9660 (MISCELLANEOUS) IMPLANT
BNDG GAUZE ELAST 4 BULKY (GAUZE/BANDAGES/DRESSINGS) ×4 IMPLANT
CANISTER SUCTION 2500CC (MISCELLANEOUS) ×4 IMPLANT
CANNULA GUNDRY RCSP 15FR (MISCELLANEOUS) ×4 IMPLANT
CANNULA VESSEL 3MM BLUNT TIP (CANNULA) ×4 IMPLANT
CATH CPB KIT VANTRIGT (MISCELLANEOUS) ×4 IMPLANT
CATH ROBINSON RED A/P 18FR (CATHETERS) ×12 IMPLANT
CATH THORACIC 36FR RT ANG (CATHETERS) ×4 IMPLANT
CLIP FOGARTY SPRING 6M (CLIP) ×4 IMPLANT
CLIP TI WIDE RED SMALL 24 (CLIP) ×4 IMPLANT
COVER SURGICAL LIGHT HANDLE (MISCELLANEOUS) ×4 IMPLANT
CRADLE DONUT ADULT HEAD (MISCELLANEOUS) ×4 IMPLANT
DRAIN CHANNEL 32F RND 10.7 FF (WOUND CARE) ×4 IMPLANT
DRAPE CARDIOVASCULAR INCISE (DRAPES) ×2
DRAPE SLUSH/WARMER DISC (DRAPES) ×4 IMPLANT
DRAPE SRG 135X102X78XABS (DRAPES) ×2 IMPLANT
DRSG AQUACEL AG ADV 3.5X14 (GAUZE/BANDAGES/DRESSINGS) ×4 IMPLANT
ELECT BLADE 4.0 EZ CLEAN MEGAD (MISCELLANEOUS) ×4
ELECT BLADE 6.5 EXT (BLADE) ×4 IMPLANT
ELECT CAUTERY BLADE 6.4 (BLADE) ×4 IMPLANT
ELECT REM PT RETURN 9FT ADLT (ELECTROSURGICAL) ×8
ELECTRODE BLDE 4.0 EZ CLN MEGD (MISCELLANEOUS) ×2 IMPLANT
ELECTRODE REM PT RTRN 9FT ADLT (ELECTROSURGICAL) ×4 IMPLANT
FELT TEFLON 1X6 (MISCELLANEOUS) ×4 IMPLANT
GAUZE SPONGE 4X4 12PLY STRL (GAUZE/BANDAGES/DRESSINGS) ×8 IMPLANT
GLOVE BIO SURGEON STRL SZ 6.5 (GLOVE) ×3 IMPLANT
GLOVE BIO SURGEON STRL SZ7 (GLOVE) ×4 IMPLANT
GLOVE BIO SURGEON STRL SZ7.5 (GLOVE) ×12 IMPLANT
GLOVE BIO SURGEONS STRL SZ 6.5 (GLOVE) ×1
GLOVE BIOGEL PI IND STRL 6 (GLOVE) ×6 IMPLANT
GLOVE BIOGEL PI IND STRL 6.5 (GLOVE) ×2 IMPLANT
GLOVE BIOGEL PI IND STRL 7.5 (GLOVE) ×4 IMPLANT
GLOVE BIOGEL PI INDICATOR 6 (GLOVE) ×6
GLOVE BIOGEL PI INDICATOR 6.5 (GLOVE) ×2
GLOVE BIOGEL PI INDICATOR 7.5 (GLOVE) ×4
GOWN STRL REUS W/ TWL LRG LVL3 (GOWN DISPOSABLE) ×12 IMPLANT
GOWN STRL REUS W/TWL LRG LVL3 (GOWN DISPOSABLE) ×12
HEMOSTAT POWDER SURGIFOAM 1G (HEMOSTASIS) ×12 IMPLANT
HEMOSTAT SURGICEL 2X14 (HEMOSTASIS) ×4 IMPLANT
INSERT FOGARTY XLG (MISCELLANEOUS) IMPLANT
KIT BASIN OR (CUSTOM PROCEDURE TRAY) ×4 IMPLANT
KIT ROOM TURNOVER OR (KITS) ×4 IMPLANT
KIT SUCTION CATH 14FR (SUCTIONS) ×4 IMPLANT
KIT VASOVIEW 6 PRO VH 2400 (KITS) ×4 IMPLANT
LEAD PACING MYOCARDI (MISCELLANEOUS) ×4 IMPLANT
MARKER GRAFT CORONARY BYPASS (MISCELLANEOUS) ×12 IMPLANT
NS IRRIG 1000ML POUR BTL (IV SOLUTION) ×24 IMPLANT
PACK OPEN HEART (CUSTOM PROCEDURE TRAY) ×4 IMPLANT
PAD ARMBOARD 7.5X6 YLW CONV (MISCELLANEOUS) ×8 IMPLANT
PAD ELECT DEFIB RADIOL ZOLL (MISCELLANEOUS) ×4 IMPLANT
PENCIL BUTTON HOLSTER BLD 10FT (ELECTRODE) ×4 IMPLANT
PUNCH AORTIC ROTATE  4.5MM 8IN (MISCELLANEOUS) ×4 IMPLANT
PUNCH AORTIC ROTATE 4.0MM (MISCELLANEOUS) IMPLANT
PUNCH AORTIC ROTATE 4.5MM 8IN (MISCELLANEOUS) IMPLANT
PUNCH AORTIC ROTATE 5MM 8IN (MISCELLANEOUS) IMPLANT
SET CARDIOPLEGIA MPS 5001102 (MISCELLANEOUS) ×4 IMPLANT
SPONGE LAP 18X18 X RAY DECT (DISPOSABLE) ×4 IMPLANT
SPONGE LAP 4X18 X RAY DECT (DISPOSABLE) ×4 IMPLANT
SURGIFLO W/THROMBIN 8M KIT (HEMOSTASIS) ×4 IMPLANT
SUT BONE WAX W31G (SUTURE) ×4 IMPLANT
SUT MNCRL AB 3-0 PS2 18 (SUTURE) ×4 IMPLANT
SUT MNCRL AB 4-0 PS2 18 (SUTURE) IMPLANT
SUT PROLENE 3 0 SH DA (SUTURE) IMPLANT
SUT PROLENE 3 0 SH1 36 (SUTURE) IMPLANT
SUT PROLENE 4 0 RB 1 (SUTURE) ×4
SUT PROLENE 4 0 SH DA (SUTURE) ×4 IMPLANT
SUT PROLENE 4-0 RB1 .5 CRCL 36 (SUTURE) ×4 IMPLANT
SUT PROLENE 5 0 C 1 36 (SUTURE) IMPLANT
SUT PROLENE 6 0 C 1 30 (SUTURE) ×8 IMPLANT
SUT PROLENE 6 0 CC (SUTURE) ×12 IMPLANT
SUT PROLENE 8 0 BV175 6 (SUTURE) ×16 IMPLANT
SUT PROLENE BLUE 7 0 (SUTURE) ×20 IMPLANT
SUT SILK  1 MH (SUTURE)
SUT SILK 1 MH (SUTURE) IMPLANT
SUT SILK 2 0 SH CR/8 (SUTURE) IMPLANT
SUT SILK 3 0 (SUTURE) ×2
SUT SILK 3 0 SH CR/8 (SUTURE) IMPLANT
SUT SILK 3-0 18XBRD TIE 12 (SUTURE) ×2 IMPLANT
SUT STEEL 6MS V (SUTURE) ×8 IMPLANT
SUT STEEL SZ 6 DBL 3X14 BALL (SUTURE) ×8 IMPLANT
SUT VIC AB 1 CTX 36 (SUTURE) ×4
SUT VIC AB 1 CTX36XBRD ANBCTR (SUTURE) ×4 IMPLANT
SUT VIC AB 2-0 CT1 27 (SUTURE) ×2
SUT VIC AB 2-0 CT1 TAPERPNT 27 (SUTURE) ×2 IMPLANT
SUT VIC AB 2-0 CTX 27 (SUTURE) IMPLANT
SUT VIC AB 3-0 X1 27 (SUTURE) IMPLANT
SUTURE E-PAK OPEN HEART (SUTURE) ×4 IMPLANT
SYSTEM SAHARA CHEST DRAIN ATS (WOUND CARE) ×4 IMPLANT
TAPE CLOTH SURG 4X10 WHT LF (GAUZE/BANDAGES/DRESSINGS) ×4 IMPLANT
TAPE PAPER 2X10 WHT MICROPORE (GAUZE/BANDAGES/DRESSINGS) ×4 IMPLANT
TOWEL OR 17X24 6PK STRL BLUE (TOWEL DISPOSABLE) ×8 IMPLANT
TOWEL OR 17X26 10 PK STRL BLUE (TOWEL DISPOSABLE) ×8 IMPLANT
TRAY FOLEY IC TEMP SENS 16FR (CATHETERS) ×4 IMPLANT
TUBING INSUFFLATION (TUBING) ×4 IMPLANT
UNDERPAD 30X30 INCONTINENT (UNDERPADS AND DIAPERS) ×4 IMPLANT
WATER STERILE IRR 1000ML POUR (IV SOLUTION) ×8 IMPLANT

## 2015-07-04 NOTE — Care Management Note (Signed)
Case Management Note  Patient Details  Name: Joseph Sanford MRN: [id number] Date of Birth: 1945/01/28  Subjective/Objective:    Pt is s/p CABG                Action/Plan:  PTA pt was from home alone - widower.  Pt is currently intubated - CM will assess if pt has recommended supervision upon extubation.  CM will continue to monitor for disposition needs   Expected Discharge Date:                  Expected Discharge Plan:     In-House Referral:     Discharge planning Services  CM Consult  Post Acute Care Choice:    Choice offered to:     DME Arranged:    DME Agency:     HH Arranged:    HH Agency:     Status of Service:  In process, will continue to follow  If discussed at Long Length of Stay Meetings, dates discussed:    Additional Comments:  Maryclare Labrador, RN 07/04/2015, 2:55 PM

## 2015-07-04 NOTE — Anesthesia Postprocedure Evaluation (Signed)
Anesthesia Post Note  Patient: Joseph Sanford  Procedure(s) Performed: Procedure(s) (LRB): CORONARY ARTERY BYPASS GRAFTING (CABG) x 4 (N/A) TRANSESOPHAGEAL ECHOCARDIOGRAM (TEE) (N/A)  Patient location during evaluation: SICU Anesthesia Type: General Level of consciousness: sedated Pain management: pain level controlled Vital Signs Assessment: post-procedure vital signs reviewed and stable Respiratory status: patient remains intubated per anesthesia plan Cardiovascular status: stable Anesthetic complications: no    Last Vitals:  Filed Vitals:   07/04/15 1515 07/04/15 1530  BP:    Pulse: 80 80  Temp: 35.7 C 35.9 C  Resp: 12 19    Last Pain: There were no vitals filed for this visit.               Zenaida Deed

## 2015-07-04 NOTE — Anesthesia Preprocedure Evaluation (Signed)
Anesthesia Evaluation  Patient identified by MRN, date of birth, ID band Patient awake    Reviewed: Allergy & Precautions, H&P , NPO status , Patient's Chart, lab work & pertinent test results  History of Anesthesia Complications Negative for: history of anesthetic complications  Airway Mallampati: II  TM Distance: >3 FB Neck ROM: full    Dental no notable dental hx.    Pulmonary neg pulmonary ROS,    Pulmonary exam normal breath sounds clear to auscultation       Cardiovascular hypertension, Pt. on medications + CAD, + Past MI and + Cardiac Stents  Normal cardiovascular exam Rhythm:regular Rate:Normal     Neuro/Psych CVA, No Residual Symptoms    GI/Hepatic negative GI ROS, Neg liver ROS,   Endo/Other  diabetes, Type 2  Renal/GU Renal disease     Musculoskeletal   Abdominal   Peds  Hematology negative hematology ROS (+)   Anesthesia Other Findings   Reproductive/Obstetrics negative OB ROS                             Anesthesia Physical Anesthesia Plan  ASA: IV  Anesthesia Plan: General   Post-op Pain Management:    Induction: Intravenous  Airway Management Planned: Oral ETT  Additional Equipment: Arterial line, TEE, CVP, PA Cath and Ultrasound Guidance Line Placement  Intra-op Plan:   Post-operative Plan: Post-operative intubation/ventilation  Informed Consent: I have reviewed the patients History and Physical, chart, labs and discussed the procedure including the risks, benefits and alternatives for the proposed anesthesia with the patient or authorized representative who has indicated his/her understanding and acceptance.   Dental Advisory Given  Plan Discussed with: Anesthesiologist, CRNA and Surgeon  Anesthesia Plan Comments:         Anesthesia Quick Evaluation

## 2015-07-04 NOTE — Progress Notes (Signed)
The patient was examined and preop studies reviewed. There has been no change from the prior exam and the patient is ready for surgery.   Plan CABG on Joseph Sanford

## 2015-07-04 NOTE — Progress Notes (Signed)
Patient ID: Joseph Sanford, male   DOB: 1945/03/08, 70 y.o.   MRN: [id number] EVENING ROUNDS NOTE :     [address]       [place] [address]             [phone]                 Day of Surgery Procedure(s) (LRB): CORONARY ARTERY BYPASS GRAFTING (CABG) x 4 (N/A) TRANSESOPHAGEAL ECHOCARDIOGRAM (TEE) (N/A)  Total Length of Stay:  LOS: 0 days  BP 118/69 mmHg  Pulse 88  Temp(Src) 97.7 F (36.5 C) (Core (Comment))  Resp 18  Wt 177 lb (80.287 kg)  SpO2 100%  .Intake/Output      06/19 0701 - 06/20 0700 06/20 0701 - 06/21 0700   I.V. (mL/kg)  2798 (34.8)   Blood  595   NG/GT  30   IV Piggyback  1280   Total Intake(mL/kg)  4703 (58.6)   Urine (mL/kg/hr)  2600 (2.8)   Emesis/NG output  100 (0.1)   Blood  1635 (1.7)   Chest Tube  130 (0.1)   Total Output   4465   Net   +238          . sodium chloride 20 mL/hr at 07/04/15 1415  . [START ON 07/05/2015] sodium chloride    . sodium chloride 20 mL/hr at 07/04/15 1428  . dexmedetomidine Stopped (07/04/15 1526)  . DOPamine 1.5 mcg/kg/min (07/04/15 1700)  . insulin (NOVOLIN-R) infusion 1.6 Units/hr (07/04/15 1822)  . lactated ringers    . lactated ringers 20 mL/hr at 07/04/15 1400  . nitroGLYCERIN    . phenylephrine (NEO-SYNEPHRINE) Adult infusion Stopped (07/04/15 1800)     Lab Results  Component Value Date   WBC 15.6* 07/04/2015   HGB 10.8* 07/04/2015   HCT 34.0* 07/04/2015   PLT 120* 07/04/2015   GLUCOSE 157* 07/04/2015   CHOL  11/05/2008    91        ATP III CLASSIFICATION:  <200     mg/dL   Desirable  200-239  mg/dL   Borderline High  >=240    mg/dL   High          TRIG 156* 11/05/2008   HDL 32* 11/05/2008   LDLCALC  11/05/2008    28        Total Cholesterol/HDL:CHD Risk Coronary Heart Disease Risk Table                     Men   Women  1/2 Average Risk   3.4   3.3  Average Risk       5.0   4.4  2 X Average Risk   9.6   7.1  3 X Average Risk  23.4   11.0        Use the calculated Patient  Ratio above and the CHD Risk Table to determine the patient's CHD Risk.        ATP III CLASSIFICATION (LDL):  <100     mg/dL   Optimal  100-129  mg/dL   Near or Above                    Optimal  130-159  mg/dL   Borderline  160-189  mg/dL   High  >190     mg/dL   Very High   ALT 24 06/28/2015   AST 23 06/28/2015   NA 138 07/04/2015   K  3.6 07/04/2015   CL 103 07/04/2015   CREATININE 0.60* 07/04/2015   BUN 11 07/04/2015   CO2 22 06/28/2015   TSH 1.288 Test methodology is 3rd generation TSH 11/05/2008   INR 1.43 07/04/2015   HGBA1C 9.7* 06/28/2015   Sitting up post op, alert and extubated  Not bleeding   Grace Isaac MD  Beeper [phone] Office [phone] 07/04/2015 6:45 PM

## 2015-07-04 NOTE — Procedures (Signed)
Extubation Procedure Note  Patient Details:   Name: Joseph Sanford DOB: 12/07/1945 MRN: [id number]   Airway Documentation:     Evaluation  O2 sats: stable throughout Complications: No apparent complications Patient did tolerate procedure well. Bilateral Breath Sounds: Clear   Yes   Patient extubated to [place]. Vital signs stable at this time. No complications. RN at bedside. NIF-35 VC-1.5L. RT will continue to monitor  Mcneil Sober 07/04/2015, 6:34 PM

## 2015-07-04 NOTE — Brief Op Note (Signed)
07/04/2015  11:39 AM  PATIENT:  Joseph Sanford  70 y.o. male  PRE-OPERATIVE DIAGNOSIS:  CAD  POST-OPERATIVE DIAGNOSIS:  CAD  PROCEDURE:  Procedure(s):  CORONARY ARTERY BYPASS GRAFTING x 4 -LIMA to LAD -SVG to DIAGONAL -SVG to OM -SVG to PDA  ENDOSCOPIC HARVEST GREATER SAPHENOUS VEIN  -Right Leg  TRANSESOPHAGEAL ECHOCARDIOGRAM (TEE) (N/A)  SURGEON:  Surgeon(s) and Role:    * Ivin Poot, MD - Primary  PHYSICIAN ASSISTANT: Ellwood Handler PA-C  Valentina Gu  ANESTHESIA:   general  EBL:  Total I/O In: 1000 [I.V.:1000] Out: 500 [Urine:500]  BLOOD ADMINISTERED:  CELLSAVER  DRAINS: Left Pleural Chest Tbue   LOCAL MEDICATIONS USED:  NONE  SPECIMEN:  No Specimen  DISPOSITION OF SPECIMEN:  N/A  COUNTS:  YES  TOURNIQUET:  * No tourniquets in log *  DICTATION: .Dragon Dictation  PLAN OF CARE: Admit to inpatient   PATIENT DISPOSITION:  ICU - intubated and hemodynamically stable.   Delay start of Pharmacological VTE agent (>24hrs) due to surgical blood loss or risk of bleeding: yes

## 2015-07-04 NOTE — Transfer of Care (Signed)
Immediate Anesthesia Transfer of Care Note  Patient: Joseph Sanford  Procedure(s) Performed: Procedure(s): CORONARY ARTERY BYPASS GRAFTING (CABG) x 4 (N/A) TRANSESOPHAGEAL ECHOCARDIOGRAM (TEE) (N/A)  Patient Location: SICU  Anesthesia Type:General  Level of Consciousness: sedated and unresponsive  Airway & Oxygen Therapy: Patient remains intubated per anesthesia plan and Patient placed on Ventilator (see vital sign flow sheet for setting)  Post-op Assessment: Report given to RN and Post -op Vital signs reviewed and stable  Post vital signs: Reviewed and stable  Last Vitals:  Filed Vitals:   07/04/15 0555  BP: 180/69  Pulse: 59  Temp: 36.5 C  Resp: 18    Last Pain: There were no vitals filed for this visit.    Patients Stated Pain Goal: 3 ([phone] [id number])  Complications: No apparent anesthesia complications

## 2015-07-04 NOTE — Anesthesia Procedure Notes (Addendum)
Procedure Name: Intubation Date/Time: 07/04/2015 7:46 AM Performed by: Lance Coon Pre-anesthesia Checklist: Patient identified, Timeout performed, Emergency Drugs available, Suction available and Patient being monitored Patient Re-evaluated:Patient Re-evaluated prior to inductionOxygen Delivery Method: Circle system utilized Preoxygenation: Pre-oxygenation with 100% oxygen Intubation Type: IV induction Ventilation: Mask ventilation without difficulty and Oral airway inserted - appropriate to patient size Laryngoscope Size: Sabra Heck and 2 Grade View: Grade I Tube type: Oral Tube size: 8.0 mm Number of attempts: 1 Airway Equipment and Method: Stylet Placement Confirmation: ETT inserted through vocal cords under direct vision,  positive ETCO2 and breath sounds checked- equal and bilateral Secured at: 22 cm Tube secured with: Tape Dental Injury: Teeth and Oropharynx as per pre-operative assessment     Central Venous Catheter Insertion Performed by: anesthesiologist 07/04/2015 6:45 AM Patient location: Pre-op. Preanesthetic checklist: patient identified, IV checked, site marked, risks and benefits discussed, surgical consent, monitors and equipment checked, pre-op evaluation, timeout performed and anesthesia consent Lidocaine 1% used for infiltration Landmarks identified Catheter size: 8.5 Fr Central line was placed.Sheath introducer Procedure performed using ultrasound guided technique. Attempts: 1 Following insertion, line sutured and dressing applied. Post procedure assessment: blood return through all ports, free fluid flow and no air. Patient tolerated the procedure well with no immediate complications.    Central Venous Catheter Insertion Performed by: anesthesiologist Patient location: Pre-op. Preanesthetic checklist: patient identified, IV checked, site marked, risks and benefits discussed, surgical consent, monitors and equipment checked, pre-op evaluation, timeout  performed and anesthesia consent Position: Trendelenburg Landmarks identified PA cath was placed.Swan type and PA catheter depth:thermodilation and 45PA Cath depth:45 Procedure performed using ultrasound guided technique. Attempts: 1 Patient tolerated the procedure well with no immediate complications.

## 2015-07-04 NOTE — Progress Notes (Signed)
  Echocardiogram Echocardiogram Transesophageal has been performed.  Darlina Sicilian M 07/04/2015, 10:45 AM

## 2015-07-05 ENCOUNTER — Encounter: Payer: Self-pay | Admitting: *Deleted

## 2015-07-05 ENCOUNTER — Inpatient Hospital Stay (HOSPITAL_COMMUNITY): Payer: Commercial Managed Care - HMO

## 2015-07-05 DIAGNOSIS — E1122 Type 2 diabetes mellitus with diabetic chronic kidney disease: Secondary | ICD-10-CM

## 2015-07-05 DIAGNOSIS — I639 Cerebral infarction, unspecified: Secondary | ICD-10-CM | POA: Insufficient documentation

## 2015-07-05 LAB — POCT I-STAT, CHEM 8
BUN: 12 mg/dL (ref 6–20)
Calcium, Ion: 1.22 mmol/L (ref 1.13–1.30)
Chloride: 99 mmol/L — ABNORMAL LOW (ref 101–111)
Creatinine, Ser: 1 mg/dL (ref 0.61–1.24)
Glucose, Bld: 163 mg/dL — ABNORMAL HIGH (ref 65–99)
HCT: 33 % — ABNORMAL LOW (ref 39.0–52.0)
Hemoglobin: 11.2 g/dL — ABNORMAL LOW (ref 13.0–17.0)
Potassium: 4.4 mmol/L (ref 3.5–5.1)
Sodium: 136 mmol/L (ref 135–145)
TCO2: 26 mmol/L (ref 0–100)

## 2015-07-05 LAB — POCT I-STAT 3, ART BLOOD GAS (G3+)
Acid-Base Excess: 1 mmol/L (ref 0.0–2.0)
Bicarbonate: 25.6 mEq/L — ABNORMAL HIGH (ref 20.0–24.0)
O2 Saturation: 98 %
TCO2: 27 mmol/L (ref 0–100)
pCO2 arterial: 41.6 mmHg (ref 35.0–45.0)
pH, Arterial: 7.397 (ref 7.350–7.450)
pO2, Arterial: 100 mmHg (ref 80.0–100.0)

## 2015-07-05 LAB — CBC
HCT: 30.9 % — ABNORMAL LOW (ref 39.0–52.0)
HEMATOCRIT: 33.1 % — AB (ref 39.0–52.0)
HEMOGLOBIN: 9.9 g/dL — AB (ref 13.0–17.0)
HEMOGLOBIN: 10.6 g/dL — AB (ref 13.0–17.0)
MCH: 26.3 pg (ref 26.0–34.0)
MCH: 26.4 pg (ref 26.0–34.0)
MCHC: 32 g/dL (ref 30.0–36.0)
MCHC: 32 g/dL (ref 30.0–36.0)
MCV: 82.2 fL (ref 78.0–100.0)
MCV: 82.3 fL (ref 78.0–100.0)
Platelets: 121 10*3/uL — ABNORMAL LOW (ref 150–400)
Platelets: 139 10*3/uL — ABNORMAL LOW (ref 150–400)
RBC: 3.76 MIL/uL — ABNORMAL LOW (ref 4.22–5.81)
RBC: 4.02 MIL/uL — AB (ref 4.22–5.81)
RDW: 13.9 % (ref 11.5–15.5)
RDW: 14.2 % (ref 11.5–15.5)
WBC: 13.5 10*3/uL — ABNORMAL HIGH (ref 4.0–10.5)
WBC: 19.9 10*3/uL — AB (ref 4.0–10.5)

## 2015-07-05 LAB — BASIC METABOLIC PANEL
Anion gap: 4 — ABNORMAL LOW (ref 5–15)
BUN: 9 mg/dL (ref 6–20)
CHLORIDE: 110 mmol/L (ref 101–111)
CO2: 25 mmol/L (ref 22–32)
CREATININE: 0.95 mg/dL (ref 0.61–1.24)
Calcium: 8.1 mg/dL — ABNORMAL LOW (ref 8.9–10.3)
GFR calc Af Amer: >60 mL/min (ref >60)
GFR calc non Af Amer: >60 mL/min (ref >60)
Glucose, Bld: 96 mg/dL (ref 65–99)
Potassium: 4.1 mmol/L (ref 3.5–5.1)
SODIUM: 139 mmol/L (ref 135–145)

## 2015-07-05 LAB — GLUCOSE, CAPILLARY
Glucose-Capillary: 102 mg/dL — ABNORMAL HIGH (ref 65–99)
Glucose-Capillary: 135 mg/dL — ABNORMAL HIGH (ref 65–99)
Glucose-Capillary: 140 mg/dL — ABNORMAL HIGH (ref 65–99)
Glucose-Capillary: 143 mg/dL — ABNORMAL HIGH (ref 65–99)
Glucose-Capillary: 207 mg/dL — ABNORMAL HIGH (ref 65–99)
Glucose-Capillary: 44 mg/dL — CL (ref 65–99)
Glucose-Capillary: 78 mg/dL (ref 65–99)
Glucose-Capillary: 79 mg/dL (ref 65–99)
Glucose-Capillary: 86 mg/dL (ref 65–99)
Glucose-Capillary: 88 mg/dL (ref 65–99)
Glucose-Capillary: 90 mg/dL (ref 65–99)
Glucose-Capillary: 94 mg/dL (ref 65–99)
Glucose-Capillary: 97 mg/dL (ref 65–99)

## 2015-07-05 LAB — CREATININE, SERUM: CREATININE: 1.1 mg/dL (ref 0.61–1.24)

## 2015-07-05 LAB — MAGNESIUM
MAGNESIUM: 1.9 mg/dL (ref 1.7–2.4)
Magnesium: 2.1 mg/dL (ref 1.7–2.4)

## 2015-07-05 MED ORDER — INSULIN DETEMIR 100 UNIT/ML ~~LOC~~ SOLN
15.0000 [IU] | Freq: Two times a day (BID) | SUBCUTANEOUS | Status: DC
  Administered 2015-07-05 – 2015-07-07 (×6): 15 [IU] via SUBCUTANEOUS
  Filled 2015-07-05 (×9): qty 0.15

## 2015-07-05 MED ORDER — INSULIN ASPART 100 UNIT/ML ~~LOC~~ SOLN
0.0000 [IU] | SUBCUTANEOUS | Status: DC
  Administered 2015-07-05 (×2): 2 [IU] via SUBCUTANEOUS
  Administered 2015-07-05: 8 [IU] via SUBCUTANEOUS
  Administered 2015-07-06: 2 [IU] via SUBCUTANEOUS

## 2015-07-05 MED ORDER — METOCLOPRAMIDE HCL 5 MG/ML IJ SOLN
10.0000 mg | Freq: Four times a day (QID) | INTRAMUSCULAR | Status: AC
  Administered 2015-07-05 – 2015-07-06 (×4): 10 mg via INTRAVENOUS
  Filled 2015-07-05 (×4): qty 2

## 2015-07-05 MED ORDER — FUROSEMIDE 10 MG/ML IJ SOLN
20.0000 mg | Freq: Two times a day (BID) | INTRAMUSCULAR | Status: DC
Start: 2015-07-05 — End: 2015-07-06
  Administered 2015-07-05 – 2015-07-06 (×3): 20 mg via INTRAVENOUS
  Filled 2015-07-05 (×3): qty 2

## 2015-07-05 MED ORDER — INSULIN DETEMIR 100 UNIT/ML ~~LOC~~ SOLN
12.0000 [IU] | Freq: Two times a day (BID) | SUBCUTANEOUS | Status: DC
  Filled 2015-07-05 (×2): qty 0.12

## 2015-07-05 MED ORDER — INSULIN ASPART 100 UNIT/ML ~~LOC~~ SOLN
4.0000 [IU] | Freq: Three times a day (TID) | SUBCUTANEOUS | Status: DC
  Administered 2015-07-05 – 2015-07-07 (×5): 4 [IU] via SUBCUTANEOUS

## 2015-07-05 NOTE — Progress Notes (Signed)
1 Day Post-Op Procedure(s) (LRB): CORONARY ARTERY BYPASS GRAFTING (CABG) x 4 (N/A) TRANSESOPHAGEAL ECHOCARDIOGRAM (TEE) (N/A) Subjective: Stable after CABG 4 Minimal incisional pain Maintaining sinus rhythm, chest x-ray clear Postop labs satisfactory  Objective: Vital signs in last 24 hours: Temp:  [95.7 F (35.4 C)-98.8 F (37.1 C)] 98.4 F (36.9 C) (06/21 0700) Pulse Rate:  [78-88] 80 (06/21 0700) Cardiac Rhythm:  [-] A-V Sequential paced (06/21 0400) Resp:  [10-20] 15 (06/21 0700) BP: (91-128)/(57-75) 102/64 mmHg (06/21 0700) SpO2:  [95 %-100 %] 99 % (06/21 0700) Arterial Line BP: (100-159)/(42-61) 115/42 mmHg (06/21 0700) FiO2 (%):  [40 %-50 %] 40 % (06/20 1750) Weight:  [182 lb 12.2 oz (82.9 kg)] 182 lb 12.2 oz (82.9 kg) (06/21 0500)  Hemodynamic parameters for last 24 hours: PAP: (12-23)/(1-13) 23/8 mmHg CO:  [4.4 L/min-7.9 L/min] 5.1 L/min CI:  [2.2 L/min/m2-4 L/min/m2] 2.6 L/min/m2  Intake/Output from previous day: 06/20 0701 - 06/21 0700 In: 5916.9 [P.O.:210; I.V.:3551.9; Blood:595; NG/GT:30; IV Piggyback:1530] Out: 6010 [id number]; Emesis/NG output:100; Blood:1635; Chest Tube:350] Intake/Output this shift:         Exam    General- alert and comfortable   Lungs- clear without rales, wheezes   Cor- regular rate and rhythm, no murmur , gallop   Abdomen- soft, non-tender   Extremities - warm, non-tender, minimal edema   Neuro- oriented, appropriate, no focal weakness   Lab Results:  Recent Labs  07/04/15 1920 07/04/15 1930 07/05/15 0420  WBC 17.4*  --  13.5*  HGB 10.5* 10.9* 9.9*  HCT 33.2* 32.0* 30.9*  PLT 131*  --  121*   BMET:  Recent Labs  07/04/15 1930 07/05/15 0420  NA 142 139  K 4.1 4.1  CL 106 110  CO2  --  25  GLUCOSE 104* 96  BUN 11 9  CREATININE 0.90 0.95  CALCIUM  --  8.1*    PT/INR:  Recent Labs  07/04/15 1424  LABPROT 17.6*  INR 1.43   ABG    Component Value Date/Time   PHART 7.377 07/04/2015 1927   HCO3 24.5*  07/04/2015 1927   TCO2 26 07/04/2015 1930   ACIDBASEDEF 1.0 07/04/2015 1927   O2SAT 98.0 07/04/2015 1927   CBG (last 3)   Recent Labs  07/05/15 0411 07/05/15 0558 07/05/15 0656  GLUCAP 94 86 79    Assessment/Plan: S/P Procedure(s) (LRB): CORONARY ARTERY BYPASS GRAFTING (CABG) x 4 (N/A) TRANSESOPHAGEAL ECHOCARDIOGRAM (TEE) (N/A) Mobilize Diuresis Diabetes control d/c tubes/lines See progression orders  Start twice a day Levemir insulin for diabetic control   LOS: 1 day    Tharon Aquas Trigt III 07/05/2015

## 2015-07-05 NOTE — Op Note (Signed)
Joseph Sanford, Joseph Sanford                  ACCOUNT NO.:  [id number]  MEDICAL RECORD NO.:  [id number]  LOCATION:  2S14C                        FACILITY:  [facility]  PHYSICIAN:  Ivin Poot, M.D.  DATE OF BIRTH:  12/18/1945  DATE OF PROCEDURE: DATE OF DISCHARGE:                              OPERATIVE REPORT   OPERATIONS: 1. Coronary artery bypass grafting x4 (left internal mammary artery to     left anterior descending artery, saphenous vein graft to diagonal,     saphenous vein graft to obtuse marginal 1, saphenous vein graft to     distal posterior descending). 2. Endoscopic harvest of right leg greater saphenous vein.  SURGEON:  Ivin Poot, M.D.  ASSISTANT:  Ellwood Handler, PA-C              Kaleen Odea. SA-C. ANESTHESIA:  General by Dr. Suezanne Jacquet.Judd  PREOPERATIVE DIAGNOSIS:  Progressive class IV angina, recurrent severe coronary artery disease, status post prior percutaneous coronary intervention.  POSTOPERATIVE DIAGNOSIS:  Progressive class IV angina, recurrent severe coronary artery disease, status post prior percutaneous coronary intervention.  CLINICAL NOTE:  The patient is a 70 year old patient with prior history of PCI to his right coronary and LAD.  He had recurrent symptoms of angina and positive stress test.  Cardiac catheterization by Dr. Daneen Schick demonstrated severe recurrent coronary artery disease with very tight stenosis in the stents of the distal posterior descending.  He was not felt to be a candidate for repeat treatment of in-stent stenosis and surgical evaluation was requested.  I evaluated the patient in the office and reviewed the results of his cardiac catheterization and echocardiogram, which I personally reviewed and counseled with the patient.  I discussed the role of multivessel CABG for treatment of his CAD.  I discussed the major aspects of the surgery including the use of general anesthesia and cardiopulmonary bypass, the expected postoperative  hospital recovery, and location of the surgical incisions. I discussed with the patient risks to him of coronary artery bypass grafting including the risks of stroke, MI, bleeding, blood transfusion requirement, postoperative pulmonary problems including tracheostomy and pleural effusion, postoperative infection and death.  After reviewing these issues, he demonstrated his understanding and agreed to proceed with surgery under what I felt was an informed consent.  OPERATIVE FINDINGS: 1. Adequate conduit, slightly scarred saphenous vein, but adequate     views. 2. Difficult heavily-diseased targets.  The posterior descending     anastomosis was placed just distal to the previously-placed stents,     which were fairly distal. 3. Echocardiogram showed preserved global LV function after separation     from cardiopulmonary bypass with normal ejection fraction. 4. No blood products required for the surgery.  DESCRIPTION OF PROCEDURE:  The patient was brought to the operating room and placed supine on the operating table, general anesthesia was induced under invasive hemodynamic monitoring.  The chest, abdomen and legs were prepped with Betadine and draped as a sterile field.  A sternal incision was made as the saphenous vein was harvested endoscopically from the right leg.  The left internal mammary artery was harvested as a pedicle graft from its origin at the  subclavian vessels.  It was a good vessel with excellent flow.  The sternal retractor was placed, and the pericardium was opened and suspended.  Pursestrings were placed in the ascending aorta and right atrium, and after heparin had been administered, the patient was cannulated and placed on bypass.  The coronaries were identified for grafting.  The targets were difficult on posterior descending, the LAD and the OM1 was intramyocardial. Cardioplegia cannulas were placed for both antegrade and retrograde cold blood cardioplegia.  The  patient was cooled to 32 degrees.  The mammary artery and vein grafts were prepared for the distal anastomoses.  The crossclamp was applied.  One liter of cold blood cardioplegia was delivered in the split doses between the antegrade aortic and retrograde coronary sinus catheters.  There was good cardioplegic arrest, and septal temperature dropped less than 14 degrees.  Cardioplegia was delivered every 20 minutes or less while the crossclamp was placed. The distal coronary anastomoses were performed.  The first distal anastomosis was to the posterior descending branch of the distal right. This had a very tight proximal stenosis as well as a near total stenosis of the posterior descending and previously-placed stents.  The artery was dissected and opened.  It was 1.5-mm vessel.  A reverse saphenous vein was sewn end-to-side with running 7-0 Prolene.  There was good flow through the graft.  Cardioplegia was redosed.  The second distal anastomosis was to the OM1 branch of the left coronary.  This was intramyocardial.  It was a smaller 1.4-mm vessel with proximal 80% stenosis.  A reverse saphenous vein was sewn end-to- side with running 8-0 Prolene and there was good flow through the anastomosis.  Cardioplegia was redosed.  The third distal anastomosis was to the diagonal branch of the LAD. This was a 1.5-mm vessel, which had an ostial stenosis of 80%.  A reverse saphenous vein was sewn end-to-side with running 7-0 Prolene. There was good flow through the graft.  Cardioplegia was redosed.  The fourth distal anastomosis was to the distal third of the LAD.  It was heavily diseased.  It was 1.5-mm vessel.  The left IMA pedicle was brought through an opening created in the left lateral pericardium, it was brought down onto the LAD and sewn end-to-side with running 8-0 Prolene.  There was good flow through the anastomosis after briefly releasing the pedicle bulldog on the mammary artery.  The  bulldog was reapplied and the pedicle was secured to the epicardium with 6-0 Prolenes.  Cardioplegia was redosed.  While the crossclamp was still in place, three proximal vein anastomoses were performed on the ascending aorta with a 4.5-mm punch and running 6- 0 Prolene.  Prior to tying down the final proximal anastomosis, air was vented from the coronaries with the dose of retrograde warm blood cardioplegia.  The crossclamp was removed.  The heart resumed a spontaneous rhythm.  The vein grafts were de-aired and opened, and each had good flow.  Hemostasis was documented at the proximal and distal anastomoses.  The patient was rewarmed and reperfused.  Temporary pacing wires were applied.  The lungs were expanded and ventilator was resumed.  The patient was weaned off cardiopulmonary bypass without difficulty with stable hemodynamics and normal LV function by echo.  Protamine was administered without adverse reaction.  The cannulas were removed.  The mediastinum was irrigated with warm saline.  The superior pericardial fat was closed over the aorta.  Anterior mediastinal and left pleural chest tubes were placed and brought  out through separate incisions.  The sternum was closed with a wire.  The pectoralis fascia was closed with a running #1 Vicryl.  The subcutaneous and skin layers were closed with a running Vicryl and sterile dressings were applied.  Total cardiopulmonary bypass time was 131 minutes.     Ivin Poot, M.D.     PV/MEDQ  D:  07/04/2015  T:  07/05/2015  Job:  [id number]  cc:   Belva Crome, M.D.

## 2015-07-05 NOTE — Progress Notes (Signed)
[facility] BRIEF SICU PROGRESS NOTE  1 Day Post-Op  S/P Procedure(s) (LRB): CORONARY ARTERY BYPASS GRAFTING (CABG) x 4 (N/A) TRANSESOPHAGEAL ECHOCARDIOGRAM (TEE) (N/A)   Stable day NSR w/ stable BP Breathing comfortably w/ O2 sats 95% on RA UOP adequate  Plan: Continue current plan  Rexene Alberts, MD 07/05/2015 7:00 PM

## 2015-07-05 NOTE — Care Management Important Message (Signed)
Important Message  Patient Details  Name: Joseph Sanford MRN: [id number] Date of Birth: June 30, 1945   Medicare Important Message Given:  Yes    Loann Quill 07/05/2015, 1:43 PM

## 2015-07-06 ENCOUNTER — Inpatient Hospital Stay (HOSPITAL_COMMUNITY): Payer: Commercial Managed Care - HMO

## 2015-07-06 ENCOUNTER — Ambulatory Visit: Payer: Commercial Managed Care - HMO | Admitting: Physician Assistant

## 2015-07-06 LAB — GLUCOSE, CAPILLARY
Glucose-Capillary: 97 mg/dL (ref 65–99)
Glucose-Capillary: 89 mg/dL (ref 65–99)
Glucose-Capillary: 198 mg/dL — ABNORMAL HIGH (ref 65–99)
Glucose-Capillary: 149 mg/dL — ABNORMAL HIGH (ref 65–99)
Glucose-Capillary: 114 mg/dL — ABNORMAL HIGH (ref 65–99)

## 2015-07-06 LAB — CBC
HCT: 30.7 % — ABNORMAL LOW (ref 39.0–52.0)
Hemoglobin: 9.8 g/dL — ABNORMAL LOW (ref 13.0–17.0)
MCH: 25.8 pg — ABNORMAL LOW (ref 26.0–34.0)
MCHC: 31.9 g/dL (ref 30.0–36.0)
MCV: 80.8 fL (ref 78.0–100.0)
Platelets: 125 10*3/uL — ABNORMAL LOW (ref 150–400)
RBC: 3.8 MIL/uL — ABNORMAL LOW (ref 4.22–5.81)
RDW: 13.9 % (ref 11.5–15.5)
WBC: 15.9 10*3/uL — ABNORMAL HIGH (ref 4.0–10.5)

## 2015-07-06 LAB — BASIC METABOLIC PANEL
Anion gap: 5 (ref 5–15)
BUN: 11 mg/dL (ref 6–20)
CO2: 28 mmol/L (ref 22–32)
Calcium: 8.5 mg/dL — ABNORMAL LOW (ref 8.9–10.3)
Chloride: 101 mmol/L (ref 101–111)
Creatinine, Ser: 0.99 mg/dL (ref 0.61–1.24)
GFR calc Af Amer: >60 mL/min (ref >60)
GFR calc non Af Amer: >60 mL/min (ref >60)
Glucose, Bld: 102 mg/dL — ABNORMAL HIGH (ref 65–99)
Potassium: 4 mmol/L (ref 3.5–5.1)
Sodium: 134 mmol/L — ABNORMAL LOW (ref 135–145)

## 2015-07-06 MED ORDER — ALUM & MAG HYDROXIDE-SIMETH 200-200-20 MG/5ML PO SUSP: 15.0000 mL | ORAL | Status: DC | PRN

## 2015-07-06 MED ORDER — INSULIN ASPART 100 UNIT/ML ~~LOC~~ SOLN
0.0000 [IU] | Freq: Three times a day (TID) | SUBCUTANEOUS | Status: DC
  Administered 2015-07-06: 2 [IU] via SUBCUTANEOUS
  Administered 2015-07-06: 4 [IU] via SUBCUTANEOUS
  Administered 2015-07-07: 2 [IU] via SUBCUTANEOUS
  Administered 2015-07-08: 4 [IU] via SUBCUTANEOUS
  Administered 2015-07-08: 2 [IU] via SUBCUTANEOUS
  Administered 2015-07-09: 4 [IU] via SUBCUTANEOUS
  Administered 2015-07-09 – 2015-07-10 (×2): 2 [IU] via SUBCUTANEOUS
  Administered 2015-07-10: 4 [IU] via SUBCUTANEOUS
  Administered 2015-07-10 – 2015-07-11 (×3): 2 [IU] via SUBCUTANEOUS

## 2015-07-06 MED ORDER — SODIUM CHLORIDE 0.9% FLUSH
3.0000 mL | Freq: Two times a day (BID) | INTRAVENOUS | Status: DC
  Administered 2015-07-06 – 2015-07-09 (×6): 3 mL via INTRAVENOUS

## 2015-07-06 MED ORDER — POTASSIUM CHLORIDE CRYS ER 20 MEQ PO TBCR
20.0000 meq | EXTENDED_RELEASE_TABLET | Freq: Every day | ORAL | Status: DC
  Administered 2015-07-07 – 2015-07-08 (×2): 20 meq via ORAL
  Filled 2015-07-06 (×2): qty 1

## 2015-07-06 MED ORDER — MAGNESIUM HYDROXIDE 400 MG/5ML PO SUSP: 30.0000 mL | Freq: Every day | ORAL | Status: DC | PRN

## 2015-07-06 MED ORDER — SODIUM CHLORIDE 0.9% FLUSH: 3.0000 mL | INTRAVENOUS | Status: DC | PRN

## 2015-07-06 MED ORDER — MOVING RIGHT ALONG BOOK
Freq: Once | Status: AC
Start: 2015-07-06 — End: 2015-07-06
  Administered 2015-07-06: 1
  Filled 2015-07-06: qty 1

## 2015-07-06 MED ORDER — SODIUM CHLORIDE 0.9 % IV SOLN: 250.0000 mL | INTRAVENOUS | Status: DC | PRN

## 2015-07-06 MED ORDER — FUROSEMIDE 40 MG PO TABS
40.0000 mg | ORAL_TABLET | Freq: Every day | ORAL | Status: DC
  Administered 2015-07-07 – 2015-07-09 (×3): 40 mg via ORAL
  Filled 2015-07-06 (×3): qty 1

## 2015-07-06 NOTE — Progress Notes (Signed)
2 Days Post-Op Procedure(s) (LRB): CORONARY ARTERY BYPASS GRAFTING (CABG) x 4 (N/A) TRANSESOPHAGEAL ECHOCARDIOGRAM (TEE) (N/A) Subjective: Patient continues to progress after CABG 4 Maintaining sinus rhythm Walking hallway Chest x-ray clear Diabetes controlled with sliding scale insulin and twice a day Lantus  Objective: Vital signs in last 24 hours: Temp:  [98.1 F (36.7 C)-98.9 F (37.2 C)] 98.9 F (37.2 C) (06/22 1131) Pulse Rate:  [76-91] 91 (06/22 0945) Cardiac Rhythm:  [-] Normal sinus rhythm (06/22 0400) Resp:  [12-19] 16 (06/22 0700) BP: (98-144)/(49-100) 130/67 mmHg (06/22 0945) SpO2:  [89 %-98 %] 96 % (06/22 0700) Weight:  [179 lb 10.8 oz (81.5 kg)] 179 lb 10.8 oz (81.5 kg) (06/22 0500)  Hemodynamic parameters for last 24 hours:   Stable Intake/Output from previous day: 06/21 0701 - 06/22 0700 In: 1667.9 [P.O.:1380; I.V.:187.9; IV Piggyback:100] Out: 3030 [Urine:2730; Chest Tube:300] Intake/Output this shift: Total I/O In: -  Out: 730 [Urine:650; Chest Tube:80]       Exam    General- alert and comfortable   Lungs- clear without rales, wheezes   Cor- regular rate and rhythm, no murmur , gallop   Abdomen- soft, non-tender   Extremities - warm, non-tender, minimal edema   Neuro- oriented, appropriate, no focal weakness   Lab Results:  Recent Labs  07/05/15 1745 07/05/15 1805 07/06/15 0405  WBC 19.9*  --  15.9*  HGB 10.6* 11.2* 9.8*  HCT 33.1* 33.0* 30.7*  PLT 139*  --  125*   BMET:  Recent Labs  07/05/15 0420  07/05/15 1805 07/06/15 0405  NA 139  --  136 134*  K 4.1  --  4.4 4.0  CL 110  --  99* 101  CO2 25  --   --  28  GLUCOSE 96  --  163* 102*  BUN 9  --  12 11  CREATININE 0.95  < > 1.00 0.99  CALCIUM 8.1*  --   --  8.5*  < > = values in this interval not displayed.  PT/INR:  Recent Labs  07/04/15 1424  LABPROT 17.6*  INR 1.43   ABG    Component Value Date/Time   PHART 7.377 07/04/2015 1927   HCO3 24.5* 07/04/2015 1927   TCO2 26 07/05/2015 1805   ACIDBASEDEF 1.0 07/04/2015 1927   O2SAT 98.0 07/04/2015 1927   CBG (last 3)   Recent Labs  07/06/15 0353 07/06/15 0721 07/06/15 1129  GLUCAP 97 89 198*    Assessment/Plan: S/P Procedure(s) (LRB): CORONARY ARTERY BYPASS GRAFTING (CABG) x 4 (N/A) TRANSESOPHAGEAL ECHOCARDIOGRAM (TEE) (N/A) Mobilize Diuresis Diabetes control Plan for transfer to step-down: see transfer orders Patient needs social worker assistance with establishing skilled nursing facility  LOS: 2 days    Joseph Sanford 07/06/2015

## 2015-07-07 ENCOUNTER — Inpatient Hospital Stay (HOSPITAL_COMMUNITY): Payer: Commercial Managed Care - HMO

## 2015-07-07 ENCOUNTER — Encounter (HOSPITAL_COMMUNITY): Payer: Self-pay | Admitting: General Practice

## 2015-07-07 LAB — GLUCOSE, CAPILLARY
Glucose-Capillary: 98 mg/dL (ref 65–99)
Glucose-Capillary: 136 mg/dL — ABNORMAL HIGH (ref 65–99)
Glucose-Capillary: 115 mg/dL — ABNORMAL HIGH (ref 65–99)
Glucose-Capillary: 129 mg/dL — ABNORMAL HIGH (ref 65–99)

## 2015-07-07 LAB — CBC
HCT: 28.7 % — ABNORMAL LOW (ref 39.0–52.0)
Hemoglobin: 9.2 g/dL — ABNORMAL LOW (ref 13.0–17.0)
MCH: 25.9 pg — ABNORMAL LOW (ref 26.0–34.0)
MCHC: 32.1 g/dL (ref 30.0–36.0)
MCV: 80.8 fL (ref 78.0–100.0)
Platelets: 138 10*3/uL — ABNORMAL LOW (ref 150–400)
RBC: 3.55 MIL/uL — ABNORMAL LOW (ref 4.22–5.81)
RDW: 13.7 % (ref 11.5–15.5)
WBC: 13.2 10*3/uL — ABNORMAL HIGH (ref 4.0–10.5)

## 2015-07-07 LAB — BASIC METABOLIC PANEL
Anion gap: 4 — ABNORMAL LOW (ref 5–15)
BUN: 13 mg/dL (ref 6–20)
CO2: 30 mmol/L (ref 22–32)
Calcium: 8.5 mg/dL — ABNORMAL LOW (ref 8.9–10.3)
Chloride: 101 mmol/L (ref 101–111)
Creatinine, Ser: 1.26 mg/dL — ABNORMAL HIGH (ref 0.61–1.24)
GFR calc Af Amer: >60 mL/min (ref >60)
GFR calc non Af Amer: 57 mL/min — ABNORMAL LOW (ref >60)
Glucose, Bld: 92 mg/dL (ref 65–99)
Potassium: 3.8 mmol/L (ref 3.5–5.1)
Sodium: 135 mmol/L (ref 135–145)

## 2015-07-07 MED ORDER — POTASSIUM CHLORIDE CRYS ER 10 MEQ PO TBCR
30.0000 meq | EXTENDED_RELEASE_TABLET | Freq: Once | ORAL | Status: AC
  Administered 2015-07-07: 30 meq via ORAL
  Filled 2015-07-07: qty 1

## 2015-07-07 NOTE — Discharge Instructions (Signed)
We ask the SNF to please do the following: °1. Please obtain vital signs at least one time daily °2.Please weigh the patient daily. If he or she continues to gain weight or develops lower extremity edema, contact the office at [phone]. °3. Ambulate patient at least three times daily and please use sternal precautions. ° ° °Coronary Artery Bypass Grafting, Care After °Refer to this sheet in the next few weeks. These instructions provide you with information on caring for yourself after your procedure. Your health care provider may also give you more specific instructions. Your treatment has been planned according to current medical practices, but problems sometimes occur. Call your health care provider if you have any problems or questions after your procedure. °WHAT TO EXPECT AFTER THE PROCEDURE °Recovery from surgery will be different for everyone. Some people feel well after 3 or 4 weeks, while for others it takes longer. After your procedure, it is typical to have the following: °· Nausea and a lack of appetite.   °· Constipation. °· Weakness and fatigue.   °· Depression or irritability.   °· Pain or discomfort at your incision site. °HOME CARE INSTRUCTIONS °· Take medicines only as directed by your health care provider. Do not stop taking medicines or start any new medicines without first checking with your health care provider. °· Take your pulse as directed by your health care provider. °· Perform deep breathing as directed by your health care provider. If you were given a device called an incentive spirometer, use it to practice deep breathing several times a day. Support your chest with a pillow or your arms when you take deep breaths or cough. °· Keep incision areas clean, dry, and protected. Remove or change any bandages (dressings) only as directed by your health care provider. You may have skin adhesive strips over the incision areas. Do not take the strips off. They will fall off on their  own. °· Check incision areas daily for any swelling, redness, or drainage. °· If incisions were made in your legs, do the following: °¨ Avoid crossing your legs.   °¨ Avoid sitting for long periods of time. Change positions every 30 minutes.   °¨ Elevate your legs when you are sitting. °· Wear compression stockings as directed by your health care provider. These stockings help keep blood clots from forming in your legs. °· Take showers once your health care provider approves. Until then, only take sponge baths. Pat incisions dry. Do not rub incisions with a washcloth or towel. Do not take baths, swim, or use a hot tub until your health care provider approves. °· Eat foods that are high in fiber, such as raw fruits and vegetables, whole grains, beans, and nuts. Meats should be lean cut. Avoid canned, processed, and fried foods. °· Drink enough fluid to keep your urine clear or pale yellow. °· Weigh yourself every day. This helps identify if you are retaining fluid that may make your heart and lungs work harder. °· Rest and limit activity as directed by your health care provider. You may be instructed to: °¨ Stop any activity at once if you have chest pain, shortness of breath, irregular heartbeats, or dizziness. Get help right away if you have any of these symptoms. °¨ Move around frequently for short periods or take short walks as directed by your health care provider. Increase your activities gradually. You may need physical therapy or cardiac rehabilitation to help strengthen your muscles and build your endurance. °¨ Avoid lifting, pushing, or pulling anything   heavier than 10 lb (4.5 kg) for at least 6 weeks after surgery. °· Do not drive until your health care provider approves.  °· Ask your health care provider when you may return to work. °· Ask your health care provider when you may resume sexual activity. °· Keep all follow-up visits as directed by your health care provider. This is important. °SEEK MEDICAL  CARE IF: °· You have swelling, redness, increasing pain, or drainage at the site of an incision. °· You have a fever. °· You have swelling in your ankles or legs. °· You have pain in your legs.   °· You gain 2 or more pounds (0.9 kg) a day. °· You are nauseous or vomit. °· You have diarrhea.  °SEEK IMMEDIATE MEDICAL CARE IF: °· You have chest pain that goes to your jaw or arms. °· You have shortness of breath.   °· You have a fast or irregular heartbeat.   °· You notice a "clicking" in your breastbone (sternum) when you move.   °· You have numbness or weakness in your arms or legs. °· You feel dizzy or light-headed.   °MAKE SURE YOU: °· Understand these instructions. °· Will watch your condition. °· Will get help right away if you are not doing well or get worse. °  °This information is not intended to replace advice given to you by your health care provider. Make sure you discuss any questions you have with your health care provider. °  °Document Released: 07/20/2004 Document Revised: 01/21/2014 Document Reviewed: 06/09/2012 °Elsevier Interactive Patient Education ©2016 [employer] ° °

## 2015-07-07 NOTE — Discharge Summary (Signed)
Physician Discharge Summary       [address]       [place] [address]             [phone]    Patient ID: Joseph Sanford MRN: [id number] DOB/AGE: [birth year] 70 y.o.  Admit date: 07/04/2015 Discharge date: 07/12/2015  Admission Diagnoses: Coronary artery disease (s/p PCI and stent )  Active Diagnoses:  1.Diabetes mellitus without complication ([place]) 2.Hyperlipidemia 3.CVA (cerebral infarction) 4.Chronic kidney disease 5.ED (erectile dysfunction) 6. ABL anemia 7. Mild thrombocytopenia  Procedure (s):  Left Heart Cath and Coronary Angiography by Dr.  Tamala Julian on 06/20/2015:    Conclusion    1. Prox RCA to Mid RCA lesion, 95% stenosed. 2. 1st RPLB lesion, 95% stenosed. 3. RPDA lesion, 100% stenosed. 4. Dist RCA lesion, 60% stenosed. The lesion was previously treated with a stent (unknown type). 5. Mid RCA lesion, 60% stenosed. 6. Ost RPDA to RPDA lesion, 50% stenosed. The lesion was previously treated with a stent (unknown type). 7. Ost Ramus to Ramus lesion, 75% stenosed. 8. Ost LAD to Prox LAD lesion, 80% stenosed. 9. Ost 1st Diag lesion, 75% stenosed. 10. Mid LAD lesion, 40% stenosed. The lesion was previously treated with a stent (unknown type). 11. Ost 2nd Diag lesion, 70% stenosed. 12. Ost 3rd Mrg to 3rd Mrg lesion, 90% stenosed. 13. Ramus lesion, 80% stenosed.   Difficult procedure from the right radial approach.  Severe proximal to mid RCA stenosis with diffuse disease in RCA beyond the proximal segment.  Eccentric calcified 70-80% proximal LAD. 70% stenosis in a relatively large second diagonal, diffuse disease with 80% stenosis in the mid ramus intermedius, and severe stenosis in the distal obtuse marginal.  Inferoapical hypokinesis. EF greater than 50%.  RECOMMENDATIONS:   Consider surgical revascularization. If not a surgical candidate, PCI on the right coronary.    Coronary artery bypass grafting x4 (left internal mammary  artery to left anterior descending artery, saphenous vein graft to diagonal, saphenous vein graft to obtuse marginal 1, saphenous vein graft to distal posterior descending). 2. Endoscopic harvest of right leg greater saphenous vein by Dr. Prescott Gum on 07/04/2015.  History of Presenting Illness: This is a 70 year old diabetic nonsmoker with prior history of CAD and PCI of RCA and LAD in the past presents with symptoms of class III exertional angina relieved with nitroglycerin. Usually, his symptoms are in the morning when the temperature is cold and he walks uphill. 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 He lives alone-widower.  The patient was seen in the cardiology office and set up for cardiac catheterization which was performed on 06/20/2015 by Dr. Tamala Julian via right radial artery. The patient has a high-grade 95% stenosis of the mid RCA with ulcerated plaque. The stent in the distal RCA-PDA is patent. The vessel beyond the stent is small but appears graftable.  The LAD has a proximal 75% stenosis, the ramus intermediate has an 80% stenosis, the diagonal branch of the LAD has a 75% stenosis. The circumflex has a small third marginal with stenosis but the vessel appears too small to graft. LVEDP is normal. Ventriculogram shows normal LV EF.  Patient has never had previous major surgery other than right knee arthroscopy under general anesthesia. The patient has moderate stage III chronic kidney disease from diabetes and is not taking metformin. The patient's last hemoglobin A1c was 9.8 and he was placed on Lantus by his endocrinologist. The patient has had  bilateral cataract surgery but denies retinopathy. The patient denies neuropathy.  Dr. Prescott Gum was consulted for the consideration of coronary artery bypass grafting surgery. Potential risks, benefits, and complications were discussed with the patient and he agreed to proceed with surgery. Pre  operative carotid duplex showed no significant internal carotid artery stenosis bilaterally. He underwent a CABG x 69 on 07/04/2015.  We ask the SNF to please do the following: 1. Please obtain vital signs at least one time daily 2.Please weigh the patient daily. If he or she continues to gain weight or develops lower extremity edema, contact the office at [phone]. 3. Ambulate patient at least three times daily and please use sternal precautions.  Brief Hospital Course:  The patient was extubated the evening of surgery without difficulty. He remained afebrile and hemodynamically stable. [name], a line, chest tubes, and foley were removed early in the post operative course. Lopressor was started and titrated accordingly. He was volume over loaded and diuresed. He is currently below his pre op weight, no LE edema, and his creatinine slightly increased to 1.31. Lasix has been stopped. He had ABL anemia. He did not require a post op transfusion. His last H and H was 9.2 and 28.7. He also had mild thrombocytopenia. This did resolve as late platelet count up to 274,000. He was weaned off the insulin drip. The patient's HGA1C pre op was 9.7.  The patient's glucose remained well controlled. He will need to follow up with his medical doctor after discharge for further diabetes management and surveillance of HGA1C. The patient was felt surgically stable for transfer from the ICU to [facility] for further convalescence on 07/06/2015. He continues to progress with cardiac rehab. He was ambulating on room air. He has been tolerating a diet and has had a bowel movement. Epicardial pacing wires have been removed.  Chest tube sutures have been removed. He developed sero sanguinous drainage from proximal sternal wound. There is no surrounding erythema. Per Dr. Prescott Gum, will start Keflex and continue for one week. Also, daily dressing changes are to be done (dry 4x4, tape). The patient is felt surgically stable to SNF  for discharge today.  We ask the SNF to please do the following: 1. Please obtain vital signs at least one time daily 2.Please weigh the patient daily. If he or she continues to gain weight or develops lower extremity edema, contact the office at [phone]. 3. Ambulate patient at least three times daily and please use sternal precautions.  Latest Vital Signs: Blood pressure 94/70, pulse 77, temperature 98.1 F (36.7 C), temperature source Oral, resp. rate 16, height 5' 10.5" (1.791 m), weight 167 lb 12.8 oz (76.114 kg), SpO2 97 %.  Physical Exam: Cardiovascular: RRR Pulmonary: Mostly clear Abdomen: Soft, non tender, bowel sounds present. Extremities: Mild bilateral lower extremity edema. Wounds: Right leg wounds are clean and dry. No erythema or signs of infection. Aquacel intact.  Discharge Condition:Stable and discharged to SNF  Recent laboratory studies:  Lab Results  Component Value Date   WBC 9.3 07/10/2015   HGB 9.9* 07/10/2015   HCT 31.7* 07/10/2015   MCV 81.9 07/10/2015   PLT 274 07/10/2015   Lab Results  Component Value Date   NA 139 07/10/2015   K 4.1 07/10/2015   CL 102 07/10/2015   CO2 29 07/10/2015   CREATININE 1.31* 07/10/2015   GLUCOSE 95 07/10/2015    Diagnostic Studies: Dg Chest 2 View  07/07/2015  CLINICAL DATA:  Status  post CABG 3 days ago EXAM: CHEST  2 VIEW COMPARISON:  Portable chest x-ray of July 06, 2015 FINDINGS: There has been interval removal of the left-sided chest tube and the right internal jugular Cordis sheath. The lungs are adequately inflated. There is no focal infiltrate or pneumothorax. Traces of pleural fluid blunt the costophrenic angles. The cardiac silhouette is mildly enlarged but stable. The pulmonary vascularity is normalized. The sternal wires are intact. The retrosternal soft tissues appear normal. IMPRESSION: Small bilateral pleural effusions blunting the costophrenic angles. Interval resolution of mild interstitial edema.  Stable cardiomegaly. Electronically Signed   By: David  Martinique M.D.   On: 07/07/2015 08:17      Discharge Instructions    Amb Referral to Cardiac Rehabilitation    Complete by:  As directed   Diagnosis:  CABG  CABG X ___:  4          Discharge Medications:   Medication List    STOP taking these medications        amLODipine 2.5 MG tablet  Commonly known as:  NORVASC     losartan 25 MG tablet  Commonly known as:  COZAAR     nitroGLYCERIN 0.4 MG SL tablet  Commonly known as:  NITROSTAT      TAKE these medications        aspirin EC 325 MG tablet  Take 1 tablet (325 mg total) by mouth daily.     B-complex with vitamin C tablet  Take 1 tablet by mouth daily.     CALCIUM 600 + D PO  Take 600 mg by mouth daily.     cephALEXin 500 MG capsule  Commonly known as:  KEFLEX  Take 1 capsule (500 mg total) by mouth every 8 (eight) hours.     CoQ10 50 MG Caps  Take 50 mg by mouth daily.     diclofenac 75 MG EC tablet  Commonly known as:  VOLTAREN  Take 75 mg by mouth daily as needed (gout pain).     Fish Oil 1200 MG Caps  Take 1 capsule by mouth every 30 (thirty) days.     folic acid [id number] MCG tablet  Commonly known as:  FOLVITE  Take 800 mcg by mouth daily.     Garlic [id number] MG Caps  Take 1,000 mg by mouth daily.     insulin aspart 100 UNIT/ML FlexPen  Commonly known as:  NOVOLOG FLEXPEN  Inject 10 Units into the skin daily with supper. And pen needles 2/day     Insulin Glargine 100 UNIT/ML Solostar Pen  Commonly known as:  LANTUS SOLOSTAR  Inject 40 Units into the skin every morning.     Magnesium 250 MG Tabs  Take 250 mg by mouth daily.     metoprolol succinate 25 MG 24 hr tablet  Commonly known as:  TOPROL-XL  Take 1 tablet (25 mg total) by mouth daily.     oxyCODONE 5 MG immediate release tablet  Commonly known as:  Oxy IR/ROXICODONE  Take 1 tablet (5 mg total) by mouth every 3 (three) hours as needed for severe pain.     simvastatin 40 MG tablet    Commonly known as:  ZOCOR  Take 40 mg by mouth daily.     Vitamin B-12 5000 MCG Tbdp  Take 5,000 mcg by mouth daily.     VITAMIN D3 SUPER STRENGTH PO  Take 1 tablet by mouth daily.     Zinc 50 MG Caps  Take 50  mg by mouth daily.       The patient has been discharged on:   1.Beta Blocker:  Yes [ x  ]                              No   [   ]                              If No, reason:  2.Ace Inhibitor/ARB: Yes [   ]                                     No  [ x   ]                                     If No, reason:  3.Statin:   Yes [ x  ]                  No  [   ]                  If No, reason:  4.Ecasa:  Yes  [ x  ]                  No   [   ]                  If No, reason:  Follow Up Appointments: Follow-up Information    Follow up with Richardson Dopp, PA-C On 07/27/2015.   Specialties:  Cardiology, Physician Assistant   Why:  Appointment time is at 8:45 am   Contact information:   [address] [place] [address] [phone]       Follow up with Tharon Aquas Trigt III, MD On 08/09/2015.   Specialty:  Cardiothoracic Surgery   Why:  Pa/LAT CXR to be taken (at [facility] which is in the same building as Dr. Lucianne Lei Trigt's office) on 08/09/2015 at 11:30 am;Appointment time is at 12:00 pm   Contact information:   [place] [address] [phone]       Follow up with Mayra Neer, MD.   Specialty:  Family Medicine   Why:  Call for further diabetes management and surveillance of HGA1C 9.7   Contact information:   [address] [place] [address] [phone]       Follow up with SNF.   Why:  Please apply dry 4x4 with tape to proximal sternal wound. Change daily and PRN.      Signed: Tala Eber MPA-C 07/12/2015, 8:20 AM

## 2015-07-07 NOTE — NC FL2 (Signed)
[place] MEDICAID FL2 LEVEL OF CARE SCREENING TOOL     IDENTIFICATION  Patient Name: Joseph Sanford Birthdate: 19-Jun-1945 Sex: male Admission Date (Current Location): 07/04/2015  [facility] and [place] Number:  Herbalist and Address:  [facility], [place] [address], [place], [place] [address]      Provider Number: [id number]  Attending Physician Name and Address:  Ivin Poot, MD  Relative Name and Phone Number:       Current Level of Care: Hospital Recommended Level of Care: [place] Prior Approval Number:    Date Approved/Denied:   PASRR Number: [id number]  Discharge Plan: SNF    Current Diagnoses: Patient Active Problem List   Diagnosis Date Noted  . CVA (cerebral infarction)   . S/P CABG x 4 07/04/2015  . History of CVA (cerebrovascular accident) 06/08/2015  . Diabetes ([place]) 03/22/2015  . CAD in native artery 06/29/2014  . Essential hypertension 06/29/2014  . Hyperlipidemia 06/29/2014  . Chronic ischemic vertebrobasilar artery thalamic stroke 06/29/2014    Orientation RESPIRATION BLADDER Height & Weight     Self, Time, Situation, Place  Normal Continent Weight: 178 lb 3.2 oz (80.831 kg) Height:     BEHAVIORAL SYMPTOMS/MOOD NEUROLOGICAL BOWEL NUTRITION STATUS   (none)  (none) Continent Diet (Heart and Carb modified)  AMBULATORY STATUS COMMUNICATION OF NEEDS Skin   Limited Assist Verbally Surgical wounds (Incision Closed: RT Leg and Chest)                       Personal Care Assistance Level of Assistance  Bathing, Feeding, Dressing Bathing Assistance: Limited assistance Feeding assistance: Independent Dressing Assistance: Limited assistance     Functional Limitations Info  Sight, Hearing, Speech Sight Info: Adequate Hearing Info: Adequate Speech Info: Adequate    SPECIAL CARE FACTORS FREQUENCY  PT (By licensed PT), OT (By licensed OT)     PT Frequency: 5/ week OT Frequency: 5/ week             Contractures Contractures Info: Not present    Additional Factors Info  Insulin Sliding Scale, Allergies, Code Status Code Status Info: FULL Allergies Info: Contrast Media, Lisinopril, Lipitor, Metformin And Related, Iodine, Shellfish Allergy   Insulin Sliding Scale Info: insulin aspart (novoLOG) injection 0-24 Units Dose: 0-24 Units Freq: 3 times daily before meals & bedtime Route: [place]       Current Medications (07/07/2015):  This is the current hospital active medication list Current Facility-Administered Medications  Medication Dose Route Frequency Provider Last Rate Last Dose  . 0.45 % sodium chloride infusion   Intravenous Continuous PRN Erin R Barrett, PA-C   Stopped at 07/05/15 1015  . 0.9 %  sodium chloride infusion  250 mL Intravenous Continuous Erin R Barrett, PA-C   250 mL at 07/05/15 0600  . 0.9 %  sodium chloride infusion   Intravenous Continuous Erin R Barrett, PA-C 20 mL/hr at 07/04/15 1428    . 0.9 %  sodium chloride infusion  250 mL Intravenous PRN Ivin Poot, MD      . acetaminophen (TYLENOL) tablet 1,000 mg  1,000 mg Oral Q6H Erin R Barrett, PA-C   1,000 mg at 07/07/15 [id number]   Or  . acetaminophen (TYLENOL) solution 1,000 mg  1,000 mg Per Tube Q6H Erin R Barrett, PA-C   1,000 mg at 07/05/15 0000  . alum & mag hydroxide-simeth (MAALOX/MYLANTA) 200-200-20 MG/5ML suspension 15-30 mL  15-30 mL Oral Q4H PRN  Ivin Poot, MD      . antiseptic oral rinse (CPC / CETYLPYRIDINIUM CHLORIDE 0.05%) solution 7 mL  7 mL Mouth Rinse BID Ivin Poot, MD   7 mL at 07/07/15 1000  . aspirin EC tablet 325 mg  325 mg Oral Daily Erin R Barrett, PA-C   325 mg at 07/07/15 1025   Or  . aspirin chewable tablet 324 mg  324 mg Per Tube Daily Erin R Barrett, PA-C      . B-complex with vitamin C tablet 1 tablet  1 tablet Oral Daily Erin R Barrett, PA-C   1 tablet at 07/07/15 1024  . bisacodyl (DULCOLAX) EC tablet 10 mg  10 mg Oral Daily Erin R Barrett, PA-C   10 mg at 07/07/15 1025    Or  . bisacodyl (DULCOLAX) suppository 10 mg  10 mg Rectal Daily Erin R Barrett, PA-C      . chlorhexidine (HIBICLENS) 4 % liquid 2 application  30 mL Topical UD Ivin Poot, MD   2 application at [id number]  . diclofenac (VOLTAREN) EC tablet 75 mg  75 mg Oral Daily PRN Erin R Barrett, PA-C      . docusate sodium (COLACE) capsule 200 mg  200 mg Oral Daily Erin R Barrett, PA-C   200 mg at 0000000 [id number]  . folic acid (FOLVITE) tablet 1 mg  1 mg Oral Daily Erin R Barrett, PA-C   1 mg at 07/07/15 1025  . furosemide (LASIX) tablet 40 mg  40 mg Oral Daily Ivin Poot, MD   40 mg at 07/07/15 1025  . insulin aspart (novoLOG) injection 0-24 Units  0-24 Units Subcutaneous TID AC & HS Ivin Poot, MD   2 Units at 07/06/15 1742  . insulin aspart (novoLOG) injection 4 Units  4 Units Subcutaneous TID WC Ivin Poot, MD   4 Units at 07/07/15 0815  . insulin detemir (LEVEMIR) injection 15 Units  15 Units Subcutaneous BID Ivin Poot, MD   15 Units at 07/07/15 1026  . magnesium hydroxide (MILK OF MAGNESIA) suspension 30 mL  30 mL Oral Daily PRN Ivin Poot, MD      . metoprolol (LOPRESSOR) injection 2.5-5 mg  2.5-5 mg Intravenous Q2H PRN Erin R Barrett, PA-C      . metoprolol tartrate (LOPRESSOR) tablet 12.5 mg  12.5 mg Oral BID Erin R Barrett, PA-C   12.5 mg at 07/07/15 1024   Or  . metoprolol tartrate (LOPRESSOR) 25 mg/10 mL oral suspension 12.5 mg  12.5 mg Per Tube BID Erin R Barrett, PA-C      . midazolam (VERSED) injection 2 mg  2 mg Intravenous Q1H PRN Erin R Barrett, PA-C      . morphine 2 MG/ML injection 2-5 mg  2-5 mg Intravenous Q1H PRN Erin R Barrett, PA-C   2 mg at 07/06/15 1216  . nitroGLYCERIN 50 mg in dextrose 5 % 250 mL (0.2 mg/mL) infusion  0-100 mcg/min Intravenous Titrated Erin R Barrett, PA-C   Stopped at 07/05/15 0400  . ondansetron (ZOFRAN) injection 4 mg  4 mg Intravenous Q6H PRN Erin R Barrett, PA-C   4 mg at 07/04/15 2036  . oxyCODONE (Oxy IR/ROXICODONE)  immediate release tablet 5-10 mg  5-10 mg Oral Q3H PRN Erin R Barrett, PA-C   10 mg at 07/06/15 2139  . pantoprazole (PROTONIX) EC tablet 40 mg  40 mg Oral Daily Erin R Barrett, PA-C   40 mg at 07/07/15  1024  . potassium chloride SA (K-DUR,KLOR-CON) CR tablet 20 mEq  20 mEq Oral Daily Ivin Poot, MD   20 mEq at 07/07/15 1026  . simvastatin (ZOCOR) tablet 40 mg  40 mg Oral q1800 Erin R Barrett, PA-C   40 mg at 07/06/15 1742  . sodium chloride flush (NS) 0.9 % injection 3 mL  3 mL Intravenous Q12H Erin R Barrett, PA-C   3 mL at 07/06/15 2143  . sodium chloride flush (NS) 0.9 % injection 3 mL  3 mL Intravenous PRN Erin R Barrett, PA-C      . sodium chloride flush (NS) 0.9 % injection 3 mL  3 mL Intravenous Q12H Ivin Poot, MD   3 mL at 07/06/15 2143  . sodium chloride flush (NS) 0.9 % injection 3 mL  3 mL Intravenous PRN Ivin Poot, MD      . traMADol Veatrice Bourbon) tablet 50-100 mg  50-100 mg Oral Q4H PRN Erin R Barrett, PA-C      . vitamin B-12 (CYANOCOBALAMIN) tablet 1,000 mcg  1,000 mcg Oral Daily Erin R Barrett, PA-C   1,000 mcg at 07/07/15 1026     Discharge Medications: Please see discharge summary for a list of discharge medications.  Relevant Imaging Results:  Relevant Lab Results:   Additional Information SSN: [id number]  Samule Dry, LCSW

## 2015-07-07 NOTE — Evaluation (Signed)
Physical Therapy Evaluation Patient Details Name: Joseph Sanford MRN: [id number] DOB: [birth year] Today's Date: 07/07/2015   History of Present Illness  Pt admit for CABG x4.    Clinical Impression  Pt admitted with above diagnosis. Pt currently with functional limitations due to the deficits listed below (see PT Problem List). Pt was able to ambulate without dizziness.  Could not ilicit dizziness with testing or with transitional movements.  Agree with SNF to recover from surgery prior to d/c home. Will follow acutely.  Pt will benefit from skilled PT to increase their independence and safety with mobility to allow discharge to the venue listed below.      Follow Up Recommendations SNF;Supervision/Assistance - 24 hour    Equipment Recommendations  Rolling walker with 5" wheels;3in1 (PT)    Recommendations for Other Services       Precautions / Restrictions Precautions Precautions: Fall;Sternal      Mobility  Bed Mobility Overal bed mobility: Needs Assistance Bed Mobility: Rolling;Sidelying to Sit Rolling: Min guard Sidelying to sit: Min assist       General bed mobility comments: Cues for sternal precautions and slight assist for elevation of trunk.   Transfers Overall transfer level: Needs assistance Equipment used: Rolling walker (2 wheeled) Transfers: Sit to/from Stand Sit to Stand: Min assist         General transfer comment: Cues for hand placement for sternal precautions and steadying assist iinitially upon geting up.   Ambulation/Gait Ambulation/Gait assistance: Min assist Ambulation Distance (Feet): 300 Feet Assistive device: Rolling walker (2 wheeled) Gait Pattern/deviations: Step-through pattern;Decreased stride length   Gait velocity interpretation: Below normal speed for age/gender General Gait Details: Occasional cues for sequencing steps and RW but overall good steadiness with RW. could not ilicit any vertigo or dizziness.   Stairs             Wheelchair Mobility    Modified Rankin (Stroke Patients Only)       Balance Overall balance assessment: Needs assistance Sitting-balance support: No upper extremity supported;Feet supported Sitting balance-Leahy Scale: Fair     Standing balance support: Bilateral upper extremity supported;During functional activity Standing balance-Leahy Scale: Poor Standing balance comment: relies on RW for balance.              High level balance activites: Direction changes;Turns;Sudden stops;Head turns High Level Balance Comments: could not ilicit dizziness with any of above with pt min guard assist with RW without LOB.              Pertinent Vitals/Pain Pain Assessment: Faces Faces Pain Scale: Hurts little more Pain Location: sternum Pain Descriptors / Indicators: Aching;Grimacing;Guarding Pain Intervention(s): Limited activity within patient's tolerance;Monitored during session;Repositioned  VSS    Home Living Family/patient expects to be discharged to:: Private residence Living Arrangements: Alone Available Help at Discharge: Friend(s);Available PRN/intermittently Type of Home: House Home Access: Level entry     Home Layout: Two level;Able to live on main level with bedroom/bathroom;Full bath on main level Home Equipment: Crutches      Prior Function Level of Independence: Independent               Hand Dominance        Extremity/Trunk Assessment   Upper Extremity Assessment: Defer to OT evaluation           Lower Extremity Assessment: Generalized weakness         Communication   Communication: No difficulties  Cognition Arousal/Alertness: Awake/alert Behavior During Therapy: Flat affect Overall  Cognitive Status: Within Functional Limits for tasks assessed                      General Comments General comments (skin integrity, edema, etc.): Tested for BPPV with  negative results.  Tested gaze stability with pt not having any  difficulties with gaze stability or any oculomotor dysfunction.  Do not feel that pt has vertigo.  No dizziness with movement this pm.     Exercises General Exercises - Lower Extremity Ankle Circles/Pumps: AROM;Both;10 reps;Seated Long Arc Quad: AROM;Both;10 reps;Seated Hip Flexion/Marching: AROM;Both;10 reps;Seated      Assessment/Plan    PT Assessment Patient needs continued PT services  PT Diagnosis Generalized weakness   PT Problem List Decreased activity tolerance;Decreased balance;Decreased mobility;Decreased knowledge of use of DME;Decreased safety awareness;Decreased knowledge of precautions;Pain  PT Treatment Interventions DME instruction;Gait training;Functional mobility training;Therapeutic activities;Therapeutic exercise;Balance training;Patient/family education   PT Goals (Current goals can be found in the Care Plan section) Acute Rehab PT Goals Patient Stated Goal: to go home PT Goal Formulation: With patient Time For Goal Achievement: 07/21/15 Potential to Achieve Goals: Good    Frequency Min 3X/week   Barriers to discharge        Co-evaluation               End of Session Equipment Utilized During Treatment: Gait belt Activity Tolerance: Patient limited by fatigue Patient left: in chair;with call bell/phone within reach;with family/visitor present Nurse Communication: Mobility status         Time: [id number] PT Time Calculation (min) (ACUTE ONLY): 17 min   Charges:   PT Evaluation $PT Eval Moderate Complexity: 1 Procedure     PT G CodesIrwin Brakeman F 07-30-2015, 4:57 PM [facility] Acute Rehabilitation [phone] (pager)

## 2015-07-07 NOTE — Clinical Social Work Note (Addendum)
CSW presented bed offers. CSW waiting on the patient to decide on his preferred SNF. CSW continuing to follow for discharge needs.  [employer], LCSW [phone]

## 2015-07-07 NOTE — Progress Notes (Signed)
Utilization review completed.  

## 2015-07-07 NOTE — Care Management Note (Signed)
Case Management Note Previous CM note initiated by Elenor Quinones RN, CM  Patient Details  Name: [name] MRN: [id number] Date of Birth: 12/01/1945  Subjective/Objective:    Pt is s/p CABG                Action/Plan:  PTA pt was from home alone - widower.  Pt is currently intubated - CM will assess if pt has recommended supervision upon extubation.  CM will continue to monitor for disposition needs  07/07/15- Marvetta Gibbons RN, BSN- Pt tx from ICU to 2W on 07/06/15- d/c planning for STSNF- CSW following for placement needs  Expected Discharge Date:                  Expected Discharge Plan:  [place]  In-House Referral:  Clinical Social Work  Discharge planning Services  CM Consult  Post Acute Care Choice:    Choice offered to:     DME Arranged:    DME Agency:     HH Arranged:    [place] Agency:     Status of Service:  In process, will continue to follow  If discussed at Long Length of Stay Meetings, dates discussed:    Additional Comments:  Dawayne Patricia, RN 07/07/2015, 10:55 AM [phone]

## 2015-07-07 NOTE — Progress Notes (Addendum)
      [address]       [place] [address]             [phone]        3 Days Post-Op Procedure(s) (LRB): CORONARY ARTERY BYPASS GRAFTING (CABG) x 4 (N/A) TRANSESOPHAGEAL ECHOCARDIOGRAM (TEE) (N/A)  Subjective: Patient feeling weak and fatigued this am.  Objective: Vital signs in last 24 hours: Temp:  [98.9 F (37.2 C)-99.9 F (37.7 C)] 98.9 F (37.2 C) (06/23 0526) Pulse Rate:  [85-97] 85 (06/23 0526) Cardiac Rhythm:  [-] Normal sinus rhythm (06/22 2003) Resp:  [14-23] 20 (06/23 0526) BP: (96-154)/(57-89) 122/61 mmHg (06/23 0526) SpO2:  [92 %-100 %] 94 % (06/23 0526) Weight:  [178 lb 3.2 oz (80.831 kg)] 178 lb 3.2 oz (80.831 kg) (06/23 0526)  Pre op weight 80 kg Current Weight  07/07/15 178 lb 3.2 oz (80.831 kg)      Intake/Output from previous day: 06/22 0701 - 06/23 0700 In: 983 [P.O.:980; I.V.:3] Out: 1280 [Urine:1180; Chest Tube:100]   Physical Exam:  Cardiovascular: RRR Pulmonary: Mostly clear Abdomen: Soft, non tender, bowel sounds present. Extremities: Mild bilateral lower extremity edema. Wounds: Right leg wounds are clean and dry.  No erythema or signs of infection. Aquacel intact.  Lab Results: CBC: Recent Labs  07/06/15 0405 07/07/15 0400  WBC 15.9* 13.2*  HGB 9.8* 9.2*  HCT 30.7* 28.7*  PLT 125* 138*   BMET:  Recent Labs  07/06/15 0405 07/07/15 0400  NA 134* 135  K 4.0 3.8  CL 101 101  CO2 28 30  GLUCOSE 102* 92  BUN 11 13  CREATININE 0.99 1.26*  CALCIUM 8.5* 8.5*    PT/INR:  Lab Results  Component Value Date   INR 1.43 07/04/2015   INR 1.05 06/28/2015   INR 1.00 06/15/2015   ABG:  INR: Will add last result for INR, ABG once components are confirmed Will add last 4 CBG results once components are confirmed  Assessment/Plan:  1. CV - SR in the 80's this am. On Lopressor 12.5 mg bid 2.  Pulmonary - On room air. CXR this am appears to show no pneumothorax, cardiomegaly, small pleural  effusions.Encourage incentive spirometer. 3. Volume Overload - On Lasix 40 mg daily 4.  Acute blood loss anemia - H and H this am 9.2 and 28.7 5. Thrombocytopenia-platelets up to 138,000 6. Supplement potassium 7. DM-CBGs 149/114/98. On Insulin. Pre op HGA1C 9.7. Needs follow up with medical doctor after discharge. 8. Remove EPW in am 9. Creatinine slightly increased from 0.99 to 1.26. Not on ACE but is being diuresed. Re check in am. 10. Needs SNF when ready for discharge-hopefully Monday  ZIMMERMAN,DONIELLE MPA-C 07/07/2015,7:43 AM  patient examined and medical record reviewed,agree with above note. Tharon Aquas Trigt III 07/07/2015

## 2015-07-07 NOTE — Clinical Social Work Note (Signed)
Clinical Social Work Assessment  Patient Details  Name: Joseph Sanford MRN: [id number] Date of Birth: 06/08/1945  Date of referral:  07/07/15               Reason for consult:  Discharge Planning                Permission sought to share information with:  Facility Contact Representative, Family Supports Permission granted to share information::  Yes, Verbal Permission Granted  Name::     Joseph Sanford  Agency::  SNFs  Relationship::  Son  Contact Information:     Housing/Transportation Living arrangements for the past 2 months:  Single Family Home Source of Information:  Patient Patient Interpreter Needed:  None Criminal Activity/Legal Involvement Pertinent to Current Situation/Hospitalization:  No - Comment as needed Significant Relationships:  Adult Children Lives with:  Significant Other Do you feel safe going back to the place where you live?  Yes Need for family participation in patient care:  No (Coment)  Care giving concerns:  The patient is aggregable for short term rehab at discharge. Patient would like to rebuild his strength to return home.    Social Worker assessment / plan:  CSW met with patient at beside to complete assessment. Patient was resting comfortably in bed.CSW explained SNF search and placement process to the patient and answered  his questions. Patient reported  his supports as his son and significant other. CSW will follow up with bed offers.   Employment status:  Retired Insurance information:  Managed Medicare PT Recommendations:  Not assessed at this time Information / Referral to community resources:  Skilled Nursing Facility  Patient/Family's Response to care:  The patient appears happy with the care he is receiving in hospital and is appreciative of CSW assistance.  Patient/Family's Understanding of and Emotional Response to Diagnosis, Current Treatment, and Prognosis: The patient has a good understanding of why he was admitted. He understands the  care plan and what he will need post discharge. Emotional Assessment Appearance:  Appears stated age Attitude/Demeanor/Rapport:   (Patient was appropriate.) Affect (typically observed):  Accepting, Calm, Appropriate Orientation:  Oriented to Self, Oriented to Place, Oriented to  Time, Oriented to Situation Alcohol / Substance use:  Not Applicable Psych involvement (Current and /or in the community):  No (Comment)  Discharge Needs  Concerns to be addressed:  Discharge Planning Concerns Readmission within the last 30 days:  No Current discharge risk:  None Barriers to Discharge:  Continued Medical Work up   LaShonda A Aycock, LCSW 07/07/2015, 2:27 PM  

## 2015-07-07 NOTE — Progress Notes (Signed)
CARDIAC REHAB PHASE I   PRE:  Rate/Rhythm: 93 SR  BP:  Sitting: 145/86        SaO2: 97 RA  MODE:  Ambulation: 150 ft   POST:  Rate/Rhythm: 113 ST  BP:  Sitting: 145/74         SaO2: 98 RA  Pt up to bathroom, returned to recliner independently. Pt c/o persistent dizziness today, agreeable to walk. Pt fiancee at bedside. Pt ambulated 150 ft on RA, rolling walker, gait belt, assist x1, slow, fairly steady gait, tolerated well. Pt did continue to c/o dizziness, however, states it did not worsen with ambulation, generally weak. VSS. Encouraged IS, additional ambulation x2 today. Pt to recliner after walk, call bell within reach. Will follow.   [id number] Lenna Sciara, RN, BSN 07/07/2015 11:52 AM

## 2015-07-08 LAB — BASIC METABOLIC PANEL
ANION GAP: 6 (ref 5–15)
BUN: 14 mg/dL (ref 6–20)
CHLORIDE: 105 mmol/L (ref 101–111)
CO2: 27 mmol/L (ref 22–32)
Calcium: 8.7 mg/dL — ABNORMAL LOW (ref 8.9–10.3)
Creatinine, Ser: 1.17 mg/dL (ref 0.61–1.24)
GFR calc non Af Amer: >60 mL/min (ref >60)
Glucose, Bld: 50 mg/dL — ABNORMAL LOW (ref 65–99)
POTASSIUM: 3.5 mmol/L (ref 3.5–5.1)
Sodium: 138 mmol/L (ref 135–145)

## 2015-07-08 LAB — GLUCOSE, CAPILLARY
Glucose-Capillary: 46 mg/dL — ABNORMAL LOW (ref 65–99)
Glucose-Capillary: 111 mg/dL — ABNORMAL HIGH (ref 65–99)
Glucose-Capillary: 112 mg/dL — ABNORMAL HIGH (ref 65–99)
Glucose-Capillary: 188 mg/dL — ABNORMAL HIGH (ref 65–99)
Glucose-Capillary: 115 mg/dL — ABNORMAL HIGH (ref 65–99)
Glucose-Capillary: 131 mg/dL — ABNORMAL HIGH (ref 65–99)

## 2015-07-08 MED ORDER — INSULIN DETEMIR 100 UNIT/ML ~~LOC~~ SOLN
10.0000 [IU] | Freq: Two times a day (BID) | SUBCUTANEOUS | Status: DC
  Administered 2015-07-08 – 2015-07-12 (×9): 10 [IU] via SUBCUTANEOUS
  Filled 2015-07-08 (×10): qty 0.1

## 2015-07-08 MED ORDER — INSULIN ASPART 100 UNIT/ML ~~LOC~~ SOLN
2.0000 [IU] | Freq: Three times a day (TID) | SUBCUTANEOUS | Status: DC
  Administered 2015-07-11 – 2015-07-12 (×4): 2 [IU] via SUBCUTANEOUS

## 2015-07-08 MED ORDER — DEXTROSE 50 % IV SOLN
INTRAVENOUS | Status: AC
  Administered 2015-07-08: 20 mL
  Filled 2015-07-08: qty 50

## 2015-07-08 MED ORDER — POTASSIUM CHLORIDE CRYS ER 20 MEQ PO TBCR
40.0000 meq | EXTENDED_RELEASE_TABLET | Freq: Once | ORAL | Status: AC
  Administered 2015-07-08: 40 meq via ORAL
  Filled 2015-07-08: qty 2

## 2015-07-08 MED ORDER — POTASSIUM CHLORIDE 20 MEQ/15ML (10%) PO SOLN
20.0000 meq | Freq: Every day | ORAL | Status: DC
  Administered 2015-07-09: 20 meq via ORAL
  Filled 2015-07-08: qty 15

## 2015-07-08 MED ORDER — METOPROLOL TARTRATE 25 MG PO TABS
25.0000 mg | ORAL_TABLET | Freq: Two times a day (BID) | ORAL | Status: DC
  Administered 2015-07-08 – 2015-07-11 (×8): 25 mg via ORAL
  Filled 2015-07-08 (×8): qty 1

## 2015-07-08 NOTE — Progress Notes (Addendum)
Pacing wires removed. Pt tolerated well. Pt instructed to remain on bedrest until 4:38pm.  Bedrest completed. Pt VSS.  Fritz Pickerel, RN

## 2015-07-08 NOTE — Clinical Social Work Note (Signed)
CSW was contacted by Shauna Hugh, patient's fiance who states she and the patient's son, Saralyn Pilar will [place] today.  The family thinks this is going to be their choice.  Diane states she will have a decision for CSW tomorrow (Sunday 07/09/2015).  Sunday CSW to follow up with Diane [phone].  Projected dc on Monday.  Family is aware and requesting PTAR transportation.  Nonnie Done, LCSW [phone]  Covering weekend [place] Licensed Clinical Social Worker

## 2015-07-08 NOTE — Progress Notes (Addendum)
      [address]       [place] [address]             [phone]        4 Days Post-Op Procedure(s) (LRB): CORONARY ARTERY BYPASS GRAFTING (CABG) x 4 (N/A) TRANSESOPHAGEAL ECHOCARDIOGRAM (TEE) (N/A)  Subjective: Patient eating breakfast this am. No complaints.  Objective: Vital signs in last 24 hours: Temp:  [97.9 F (36.6 C)-98.9 F (37.2 C)] 97.9 F (36.6 C) (06/24 0417) Pulse Rate:  [84-90] 86 (06/24 0417) Cardiac Rhythm:  [-] Normal sinus rhythm (06/24 0712) Resp:  [16-18] 18 (06/24 0417) BP: (115-122)/(46-82) 117/46 mmHg (06/24 0417) SpO2:  [93 %-98 %] 98 % (06/24 0417) Weight:  [175 lb 14.8 oz (79.8 kg)] 175 lb 14.8 oz (79.8 kg) (06/24 0417)  Pre op weight 80 kg Current Weight  07/08/15 175 lb 14.8 oz (79.8 kg)      Intake/Output from previous day: 06/23 0701 - 06/24 0700 In: 1016 [P.O.:1016] Out: -    Physical Exam:  Cardiovascular: RRR Pulmonary: Mostly clear Abdomen: Soft, non tender, bowel sounds present. Extremities: Mild bilateral lower extremity edema. Wounds: All are clean and dry.  No erythema or signs of infection.  Lab Results: CBC:  Recent Labs  07/06/15 0405 07/07/15 0400  WBC 15.9* 13.2*  HGB 9.8* 9.2*  HCT 30.7* 28.7*  PLT 125* 138*   BMET:   Recent Labs  07/07/15 0400 07/08/15 0402  NA 135 138  K 3.8 3.5  CL 101 105  CO2 30 27  GLUCOSE 92 50*  BUN 13 14  CREATININE 1.26* 1.17  CALCIUM 8.5* 8.7*    PT/INR:  Lab Results  Component Value Date   INR 1.43 07/04/2015   INR 1.05 06/28/2015   INR 1.00 06/15/2015   ABG:  INR: Will add last result for INR, ABG once components are confirmed Will add last 4 CBG results once components are confirmed  Assessment/Plan:  1. CV - Slightly tachy high 90's to low 100's. On Lopressor 12.5 mg bid. Will increase to 25 mg bid. 2.  Pulmonary - On room air. .Encourage incentive spirometer. 3. Volume Overload - On Lasix 40 mg daily 4.  Acute blood loss  anemia - Last H and H9.2 and 28.7 5. Thrombocytopenia-platelets up to 138,000 6. Supplement potassium 7. Remove EPW  8.DM-CBGs 46/111/112. On Insulin but will decrease to avoid further hypoglycemia. Pre op HGA1C 9.7. He will need close medical follow up after discharge 9. Needs SNF when ready for discharge-hopefully Monday  ZIMMERMAN,DONIELLE MPA-C 07/08/2015,8:33 AM   Plan snf Monday I have seen and examined Selena Batten and agree with the above assessment  and plan.  Grace Isaac MD Beeper [phone] Office [phone] 07/08/2015 9:30 AM

## 2015-07-08 NOTE — Progress Notes (Signed)
CARDIAC REHAB PHASE I   PRE:  Rate/Rhythm: 85 SR  BP:   Sitting: 109/72     SaO2: 98% RA  MODE:  Ambulation: 500 ft   POST:  Rate/Rhythm: 90 SR  BP:   Sitting: 144/81     SaO2: 100% RA 213-235  Pt ambulated 552ft with one person assist, rollater and gait belt. Pt maintained a somewhat steady gait. Pt denied SOB, dizziness, lightheadedness and CP while walking, in fact, pt stated that he felt better. Pt increased his walking distance. Returned pt to recliner with LE elevated and VSS. Practiced ISS.  Call bell and phone in reach. Encouraged pt to walk 2x/day. Very pleasant man.   Thetis Schwimmer D Senna Lape,MS,ACSM-RCEP 07/08/2015 2:35 PM

## 2015-07-08 NOTE — Progress Notes (Signed)
Pt ambulated 150 feet in the hallway with front wheel walker.  Tolerated activity well.  Now resting in bed with call bell in reach.  Will continue to monitor pt. Lupita Dawn, RN

## 2015-07-09 LAB — GLUCOSE, CAPILLARY
Glucose-Capillary: 87 mg/dL (ref 65–99)
Glucose-Capillary: 118 mg/dL — ABNORMAL HIGH (ref 65–99)
Glucose-Capillary: 148 mg/dL — ABNORMAL HIGH (ref 65–99)
Glucose-Capillary: 172 mg/dL — ABNORMAL HIGH (ref 65–99)

## 2015-07-09 NOTE — Clinical Social Work Note (Signed)
Spoke with fiance to answer her questions regarding SNF placement. CSW answered all of her questions and reiterated how the discharge process will work. Fiance had no further questions.   Liz Beach MSW, [place], [place], [phone]

## 2015-07-09 NOTE — Progress Notes (Addendum)
      [name]       [place] [address]             [phone]        5 Days Post-Op Procedure(s) (LRB): CORONARY ARTERY BYPASS GRAFTING (CABG) x 4 (N/A) TRANSESOPHAGEAL ECHOCARDIOGRAM (TEE) (N/A)  Subjective: Patient with nausea after given Tylenol. Denies abdominal pain.  Objective: Vital signs in last 24 hours: Temp:  [98.2 F (36.8 C)-99.6 F (37.6 C)] 98.5 F (36.9 C) (06/25 0455) Pulse Rate:  [75-96] 75 (06/25 0455) Cardiac Rhythm:  [-] Normal sinus rhythm (06/25 0712) Resp:  [16-19] 16 (06/25 0455) BP: (112-153)/(63-81) 127/63 mmHg (06/25 0455) SpO2:  [96 %-99 %] 96 % (06/25 0455) Weight:  [172 lb 12.8 oz (78.382 kg)] 172 lb 12.8 oz (78.382 kg) (06/25 0455)  Pre op weight 80 kg Current Weight  07/09/15 172 lb 12.8 oz (78.382 kg)      Intake/Output from previous day: 06/24 0701 - 06/25 0700 In: 720 [P.O.:720] Out: -    Physical Exam:  Cardiovascular: RRR Pulmonary: Mostly clear Abdomen: Soft, non tender, bowel sounds present. Extremities: Mild bilateral lower extremity edema. Wounds: All are clean and dry.  No erythema or signs of infection.  Lab Results: CBC:  Recent Labs  07/07/15 0400  WBC 13.2*  HGB 9.2*  HCT 28.7*  PLT 138*   BMET:   Recent Labs  07/07/15 0400 07/08/15 0402  NA 135 138  K 3.8 3.5  CL 101 105  CO2 30 27  GLUCOSE 92 50*  BUN 13 14  CREATININE 1.26* 1.17  CALCIUM 8.5* 8.7*    PT/INR:  Lab Results  Component Value Date   INR 1.43 07/04/2015   INR 1.05 06/28/2015   INR 1.00 06/15/2015   ABG:  INR: Will add last result for INR, ABG once components are confirmed Will add last 4 CBG results once components are confirmed  Assessment/Plan:  1. CV - Slightly tachy high 70's. On Lopressor 25 mg bid.  2.  Pulmonary - On room air. .Encourage incentive spirometer. 3. Volume Overload - On Lasix 40 mg daily 4.  Acute blood loss anemia - Last H and H9.2 and 28.7 5. Thrombocytopenia-platelets  up to 138,000 6. Patient with nausea after scheduled Tylenol. Will stop.  7.DM-CBGs 115/131/87. On Insulin. Pre op HGA1C 9.7. He will need close medical follow up after discharge 8. Remove EPW 9. Needs SNF when ready for discharge-likely in am  Joseph Sanford MPA-C 07/09/2015,8:39 AM   [place] d/c tomorrow  I have seen and examined Joseph Sanford and agree with the above assessment  and plan.  Grace Isaac MD Beeper [phone] Office [phone] 07/09/2015 9:48 AM

## 2015-07-10 LAB — GLUCOSE, CAPILLARY
Glucose-Capillary: 87 mg/dL (ref 65–99)
Glucose-Capillary: 178 mg/dL — ABNORMAL HIGH (ref 65–99)
Glucose-Capillary: 150 mg/dL — ABNORMAL HIGH (ref 65–99)
Glucose-Capillary: 137 mg/dL — ABNORMAL HIGH (ref 65–99)

## 2015-07-10 LAB — BASIC METABOLIC PANEL
Anion gap: 8 (ref 5–15)
BUN: 14 mg/dL (ref 6–20)
CO2: 29 mmol/L (ref 22–32)
Calcium: 9.3 mg/dL (ref 8.9–10.3)
Chloride: 102 mmol/L (ref 101–111)
Creatinine, Ser: 1.31 mg/dL — ABNORMAL HIGH (ref 0.61–1.24)
GFR calc Af Amer: >60 mL/min (ref >60)
GFR calc non Af Amer: 54 mL/min — ABNORMAL LOW (ref >60)
Glucose, Bld: 95 mg/dL (ref 65–99)
Potassium: 4.1 mmol/L (ref 3.5–5.1)
Sodium: 139 mmol/L (ref 135–145)

## 2015-07-10 LAB — CBC
HCT: 31.7 % — ABNORMAL LOW (ref 39.0–52.0)
Hemoglobin: 9.9 g/dL — ABNORMAL LOW (ref 13.0–17.0)
MCH: 25.6 pg — ABNORMAL LOW (ref 26.0–34.0)
MCHC: 31.2 g/dL (ref 30.0–36.0)
MCV: 81.9 fL (ref 78.0–100.0)
Platelets: 274 10*3/uL (ref 150–400)
RBC: 3.87 MIL/uL — ABNORMAL LOW (ref 4.22–5.81)
RDW: 14.1 % (ref 11.5–15.5)
WBC: 9.3 10*3/uL (ref 4.0–10.5)

## 2015-07-10 MED ORDER — ASPIRIN EC 325 MG PO TBEC: 325.0000 mg | DELAYED_RELEASE_TABLET | Freq: Every day | ORAL | Status: DC

## 2015-07-10 MED ORDER — OXYCODONE HCL 5 MG PO TABS: 5.0000 mg | ORAL_TABLET | ORAL | Status: DC | PRN

## 2015-07-10 NOTE — Progress Notes (Addendum)
      [address]       [place] [address]             [phone]        6 Days Post-Op Procedure(s) (LRB): CORONARY ARTERY BYPASS GRAFTING (CABG) x 4 (N/A) TRANSESOPHAGEAL ECHOCARDIOGRAM (TEE) (N/A)  Subjective: Patient without complaints.  Objective: Vital signs in last 24 hours: Temp:  [98.5 F (36.9 C)-98.7 F (37.1 C)] 98.5 F (36.9 C) (06/26 0642) Pulse Rate:  [80-94] 88 (06/26 0642) Cardiac Rhythm:  [-] Sinus tachycardia (06/25 1932) Resp:  [16] 16 (06/26 0642) BP: (111-131)/(63-69) 125/65 mmHg (06/26 0642) SpO2:  [92 %-96 %] 95 % (06/26 0642) Weight:  [167 lb 12.8 oz (76.114 kg)] 167 lb 12.8 oz (76.114 kg) (06/26 0642)  Pre op weight 80 kg Current Weight  07/10/15 167 lb 12.8 oz (76.114 kg)      Intake/Output from previous day: 06/25 0701 - 06/26 0700 In: 480 [P.O.:480] Out: -    Physical Exam:  Cardiovascular: RRR Pulmonary: Mostly clear Abdomen: Soft, non tender, bowel sounds present. Extremities: No lower extremity edema. Wounds: All are clean and dry.  No erythema or signs of infection.  Lab Results: CBC:  Recent Labs  07/10/15 0230  WBC 9.3  HGB 9.9*  HCT 31.7*  PLT 274   BMET:   Recent Labs  07/08/15 0402 07/10/15 0230  NA 138 139  K 3.5 4.1  CL 105 102  CO2 27 29  GLUCOSE 50* 95  BUN 14 14  CREATININE 1.17 1.31*  CALCIUM 8.7* 9.3    PT/INR:  Lab Results  Component Value Date   INR 1.43 07/04/2015   INR 1.05 06/28/2015   INR 1.00 06/15/2015   ABG:  INR: Will add last result for INR, ABG once components are confirmed Will add last 4 CBG results once components are confirmed  Assessment/Plan:  1. CV - SR in the 70's. On Lopressor 25 mg bid.  2.  Pulmonary - On room air. .Encourage incentive spirometer. 3.  Acute blood loss anemia - Last H and H9.2 and 28.7 4. Thrombocytopenia resolved-platelets up to 274,000 5 .DM-CBGs 118/148/172. On Insulin. Pre op HGA1C 9.7. He will need close medical  follow up after discharge 6. Creatinine increased from 1.17 to 1.31. Will stop Lasix as below pre op weight. 7.To [facility] when bed avaiable  ZIMMERMAN,DONIELLE MPA-C 07/10/2015,7:39 AM    patient examined and medical record reviewed,agree with above note. Tharon Aquas Trigt III 07/10/2015

## 2015-07-10 NOTE — Progress Notes (Signed)
PT Cancellation Note  Patient Details Name: Joseph Sanford MRN: [id number] DOB: 11/07/1945   Cancelled Treatment:    Reason Eval/Treat Not Completed: Other (comment) Pt just returned to room from working with cardiac rehab and plans to discharge today. Will follow up later as time allows.   Marguarite Arbour A Kimyah Frein 07/10/2015, 9:30 AM Wray Kearns, [place], DPT [phone]

## 2015-07-10 NOTE — Progress Notes (Signed)
Per SW unable to place at SNF this evening r/t insurance authorization. Spoke with Tacy Dura PA - pt unsafe to discharge to home alone safely. Said if SNF unavailable would need to stay another night or have 24hr supervision. Per CM: Pt fiance lives 3 hours away and needs to return to work. Son lives in [place], but works during the day. Spoke with Gold PA - pt to stay the night - reapplied telemetry, no IV to be placed.   Pt and fiance updated to plan of care by SW, CM, and RN staff.  Fritz Pickerel, RN

## 2015-07-10 NOTE — Care Management Note (Addendum)
Case Management Note Previous CM note initiated by Elenor Quinones RN, CM  Patient Details  Name: GENNARO SHANAFELT MRN: [id number] Date of Birth: [birth year]  Subjective/Objective:    Pt is s/p CABG                Action/Plan:  PTA pt was from home alone - widower.  Pt is currently intubated - CM will assess if pt has recommended supervision upon extubation.  CM will continue to monitor for disposition needs  07/07/15- Marvetta Gibbons RN, BSN- Pt tx from ICU to 2W on 07/06/15- d/c planning for STSNF- CSW following for placement needs  Expected Discharge Date:    07/10/15              Expected Discharge Plan:  Skilled Nursing Facility  In-House Referral:  Clinical Social Work  Discharge planning Services  CM Consult  Post Acute Care Choice:    Choice offered to:     DME Arranged:    DME Agency:     HH Arranged:    [place] Agency:     Status of Service:  Completed, signed off  If discussed at [employer], dates discussed:    Additional Comments:  07/11/15- [place], [employer]- pt has been denied by insurance for STSNF stay- have spoken with [place] -PA for Dr. Darcey Nora- regarding next step for pt- which would be a P2P if Dr. Darcey Nora feels strongly about pt going to STSNF- CSW has reached out to Dr. Darcey Nora also regarding P2P- -- spoke with Dr. Darcey Nora around 1130- who states that he would like to do P2P- CSW to give him the info needed to call insurance provider for P2P- will await word regarding outcome of P2P- CM to continue to follow for any further d/c needs if pt not approved for STSNF following P2P.   07/10/15- 1045- Allia Wiltsey RN, CM- pt for d/c today to SNF- [place] following for placement needs Update-1630- per CSW pt's insurance has not approved STSNF stay at this time- Psychologist, occupational is still reviewing the case- PA for Dr. Darcey Nora has been notified - plan is for pt to stay overnight to see if STSNF is approved- spoke with pt and  SO at bedside regarding assistance at home if rehab stay is not approved- per conversation SO states that she needs to get back home to work tomorrow (she lives about 3 hrs away) and would plan to return for the weekend- pt has a son in [employer] [place] but he works- asked about friends or church members that might could come stay with pt or come by to check in on him- pt states that he might have "some folks" that could possibly do that- will await word from insurance in the am- next step would be a possible P2P with Dr. Darcey Nora and Psychologist, occupational.  Dahlia Client [place], RN 07/10/2015, 10:42 AM [phone]

## 2015-07-10 NOTE — Care Management Important Message (Signed)
Important Message  Patient Details  Name: Joseph Sanford MRN: [id number] Date of Birth: [birth year]   Medicare Important Message Given:  Yes    Nathen May 07/10/2015, 10:38 AM

## 2015-07-10 NOTE — Progress Notes (Signed)
CARDIAC REHAB PHASE I   PRE:  Rate/Rhythm:114 ST  BP:  Sitting: 103/69        SaO2: 94 RA  MODE:  Ambulation: 550 ft   POST:  Rate/Rhythm: 122 ST  BP:  Sitting: 129/79         SaO2: 98 RA  Pt ambulated 550 ft on RA, rolling walker, assist x1, steady gait, tolerated well with no complaints. Cardiac surgery discharge education completed with pt and fiancee at bedside. Reviewed risk factors, IS, sternal precautions, activity progression, exercise, heart healthy diet, carb counting, sodium restrictions, daily weights and phase 2 cardiac rehab. Pt verbalized understanding. Pt agrees to phase 2 cardiac rehab referral, will send to [facility] per pt request. Pt to bed per pt request after walk, call bell within reach.   [id number] Lenna Sciara, RN, BSN 07/10/2015 9:04 AM

## 2015-07-11 LAB — GLUCOSE, CAPILLARY
Glucose-Capillary: 114 mg/dL — ABNORMAL HIGH (ref 65–99)
Glucose-Capillary: 123 mg/dL — ABNORMAL HIGH (ref 65–99)
Glucose-Capillary: 102 mg/dL — ABNORMAL HIGH (ref 65–99)
Glucose-Capillary: 120 mg/dL — ABNORMAL HIGH (ref 65–99)

## 2015-07-11 NOTE — Progress Notes (Signed)
Physical Therapy Treatment Patient Details Name: Joseph Sanford MRN: [id number] DOB: [birth year] Today's Date: 07/11/2015    History of Present Illness Pt admit for CABG x4.      PT Comments    Patient progressing with ambulation and sit to stands without UE support.  Feel he will continue to benefit from skilled PT in the acute setting and with PT in SNF setting if approved.  If, however, d/c home will need follow up HHPT and likely [facility] aide.   Follow Up Recommendations  SNF;Supervision/Assistance - 24 hour     Equipment Recommendations  Rolling walker with 5" wheels;3in1 (PT)    Recommendations for Other Services       Precautions / Restrictions Precautions Precautions: Fall;Sternal    Mobility  Bed Mobility   Bed Mobility: Rolling;Sidelying to Sit Rolling: Supervision Sidelying to sit: Supervision       General bed mobility comments: Cues for sternal precautions    Transfers   Equipment used: Rolling walker (2 wheeled) Transfers: Sit to/from Stand Sit to Stand: Min guard         General transfer comment: not using UE's for sit to stand, assist for safety/balance  Ambulation/Gait Ambulation/Gait assistance: Supervision Ambulation Distance (Feet): 400 Feet Assistive device: Rolling walker (2 wheeled) Gait Pattern/deviations: Step-through pattern;Trunk flexed;Decreased stride length     General Gait Details: no cues needed, assist for balance only    Stairs            Wheelchair Mobility    Modified Rankin (Stroke Patients Only)       Balance     Sitting balance-Leahy Scale: Good       Standing balance-Leahy Scale: Fair Standing balance comment: able to maneuver to bed no walker, one hand lightly on sink                    Cognition Arousal/Alertness: Awake/alert Behavior During Therapy: WFL for tasks assessed/performed Overall Cognitive Status: Within Functional Limits for tasks assessed                       Exercises General Exercises - Upper Extremity Shoulder Flexion: AROM;10 reps;Left;Right (to shoulder level) General Exercises - Lower Extremity Hip ABduction/ADduction: Strengthening;Both;10 reps;Standing Heel Raises: Strengthening;Both;10 reps;Standing Other Exercises Other Exercises: sit to stand x 5 no UE support minguard Other Exercises: standing hamstring curls x 10 at sink Other Exercises: shoulder rolls x 10    General Comments        Pertinent Vitals/Pain Pain Assessment: 0-10 Pain Score: 1  Pain Location: R elbow down to pinky Pain Descriptors / Indicators: Burning Pain Intervention(s): Monitored during session;Other (comment) (educated to try ice if more pain)    Home Living                      Prior Function            PT Goals (current goals can now be found in the care plan section) Progress towards PT goals: Progressing toward goals    Frequency  Min 3X/week    PT Plan Current plan remains appropriate    Co-evaluation             End of Session Equipment Utilized During Treatment: Gait belt Activity Tolerance: Patient tolerated treatment well Patient left: in bed;with call bell/phone within reach;with family/visitor present     Time: [id number] PT Time Calculation (min) (ACUTE ONLY): 21 min  Charges:  $Gait Training:  8-22 mins                    G Codes:      Reginia Naas 25-Jul-2015, 3:58 PM  Magda Kiel, [place] 2015/07/25

## 2015-07-11 NOTE — Progress Notes (Signed)
CARDIAC REHAB PHASE I   PRE:  Rate/Rhythm: 64 SR  BP:  Supine: 126/75  Sitting:   Standing:    SaO2: 95%RA  MODE:  Ambulation: 700 ft   POST:  Rate/Rhythm: 114 ST  BP:  Supine: 134/83  Sitting:   Standing:    SaO2: 95%RA 0945-1015 Pt walked 700 ft with rolling walker and asst x 1 with steady gait. Tolerated well except for heart rate elevated with activity. To bed after walk. Awaiting SNF.   Graylon Good, RN BSN  07/11/2015 10:12 AM

## 2015-07-11 NOTE — Progress Notes (Addendum)
      [address]       [place] [address]             [phone]        7 Days Post-Op Procedure(s) (LRB): CORONARY ARTERY BYPASS GRAFTING (CABG) x 4 (N/A) TRANSESOPHAGEAL ECHOCARDIOGRAM (TEE) (N/A)  Subjective: Patient without complaints.  Objective: Vital signs in last 24 hours: Temp:  [97.9 F (36.6 C)-98.2 F (36.8 C)] 98.2 F (36.8 C) (06/27 0609) Pulse Rate:  [75-88] 88 (06/27 0609) Cardiac Rhythm:  [-] Normal sinus rhythm (06/26 2040) Resp:  [14-16] 14 (06/26 2024) BP: (109-117)/(62-66) 117/63 mmHg (06/27 0609) SpO2:  [96 %-99 %] 96 % (06/27 0609)  Pre op weight 80 kg Current Weight  07/10/15 167 lb 12.8 oz (76.114 kg)      Intake/Output from previous day: 06/26 0701 - 06/27 0700 In: 40 [P.O.:366] Out: -    Physical Exam:  Cardiovascular: RRR Pulmonary: Mostly clear Abdomen: Soft, non tender, bowel sounds present. Extremities: No lower extremity edema. Wounds: All are clean and dry.  No erythema or signs of infection.  Lab Results: CBC:  Recent Labs  07/10/15 0230  WBC 9.3  HGB 9.9*  HCT 31.7*  PLT 274   BMET:   Recent Labs  07/10/15 0230  NA 139  K 4.1  CL 102  CO2 29  GLUCOSE 95  BUN 14  CREATININE 1.31*  CALCIUM 9.3    PT/INR:  Lab Results  Component Value Date   INR 1.43 07/04/2015   INR 1.05 06/28/2015   INR 1.00 06/15/2015   ABG:  INR: Will add last result for INR, ABG once components are confirmed Will add last 4 CBG results once components are confirmed  Assessment/Plan:  1. CV - SR in the 70's. On Lopressor 25 mg bid.  2.  Pulmonary - On room air. .Encourage incentive spirometer. 3.  Acute blood loss anemia - Last H and H 9.2 and 28.7 4. Thrombocytopenia resolved-platelets up to 274,000 5 .DM-CBGs 150/137/114. On Insulin. Pre op HGA1C 9.7. He will need close medical follow up after discharge 6. I was informed SNF not approved by insurance yesterday afternoon. Will discuss disposition with  Dr. Prescott Gum.  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 Tharon Aquas Trigt III 07/11/2015

## 2015-07-12 DIAGNOSIS — D62 Acute posthemorrhagic anemia: Secondary | ICD-10-CM | POA: Diagnosis not present

## 2015-07-12 DIAGNOSIS — Z8673 Personal history of transient ischemic attack (TIA), and cerebral infarction without residual deficits: Secondary | ICD-10-CM | POA: Diagnosis not present

## 2015-07-12 DIAGNOSIS — M79601 Pain in right arm: Secondary | ICD-10-CM | POA: Diagnosis not present

## 2015-07-12 DIAGNOSIS — Z48812 Encounter for surgical aftercare following surgery on the circulatory system: Secondary | ICD-10-CM | POA: Diagnosis not present

## 2015-07-12 DIAGNOSIS — E784 Other hyperlipidemia: Secondary | ICD-10-CM | POA: Diagnosis not present

## 2015-07-12 DIAGNOSIS — R2681 Unsteadiness on feet: Secondary | ICD-10-CM | POA: Diagnosis not present

## 2015-07-12 DIAGNOSIS — M6281 Muscle weakness (generalized): Secondary | ICD-10-CM | POA: Diagnosis not present

## 2015-07-12 DIAGNOSIS — R269 Unspecified abnormalities of gait and mobility: Secondary | ICD-10-CM | POA: Diagnosis not present

## 2015-07-12 DIAGNOSIS — I2510 Atherosclerotic heart disease of native coronary artery without angina pectoris: Secondary | ICD-10-CM | POA: Diagnosis not present

## 2015-07-12 DIAGNOSIS — R5381 Other malaise: Secondary | ICD-10-CM | POA: Diagnosis not present

## 2015-07-12 DIAGNOSIS — I10 Essential (primary) hypertension: Secondary | ICD-10-CM | POA: Diagnosis not present

## 2015-07-12 DIAGNOSIS — T814XXD Infection following a procedure, subsequent encounter: Secondary | ICD-10-CM | POA: Diagnosis not present

## 2015-07-12 DIAGNOSIS — N289 Disorder of kidney and ureter, unspecified: Secondary | ICD-10-CM | POA: Diagnosis not present

## 2015-07-12 DIAGNOSIS — E785 Hyperlipidemia, unspecified: Secondary | ICD-10-CM | POA: Diagnosis not present

## 2015-07-12 DIAGNOSIS — D508 Other iron deficiency anemias: Secondary | ICD-10-CM | POA: Diagnosis not present

## 2015-07-12 DIAGNOSIS — K5901 Slow transit constipation: Secondary | ICD-10-CM | POA: Diagnosis not present

## 2015-07-12 DIAGNOSIS — I25110 Atherosclerotic heart disease of native coronary artery with unstable angina pectoris: Secondary | ICD-10-CM | POA: Diagnosis not present

## 2015-07-12 DIAGNOSIS — E1122 Type 2 diabetes mellitus with diabetic chronic kidney disease: Secondary | ICD-10-CM | POA: Diagnosis not present

## 2015-07-12 DIAGNOSIS — Z951 Presence of aortocoronary bypass graft: Secondary | ICD-10-CM | POA: Diagnosis not present

## 2015-07-12 LAB — PREALBUMIN: Prealbumin: 20.4 mg/dL (ref 18–38)

## 2015-07-12 LAB — GLUCOSE, CAPILLARY
Glucose-Capillary: 96 mg/dL (ref 65–99)
Glucose-Capillary: 111 mg/dL — ABNORMAL HIGH (ref 65–99)

## 2015-07-12 MED ORDER — METOPROLOL SUCCINATE ER 25 MG PO TB24: 25.0000 mg | ORAL_TABLET | Freq: Every day | ORAL | Status: DC

## 2015-07-12 MED ORDER — CEPHALEXIN 500 MG PO CAPS
500.0000 mg | ORAL_CAPSULE | Freq: Three times a day (TID) | ORAL | Status: DC
  Administered 2015-07-12: 500 mg via ORAL
  Filled 2015-07-12: qty 1

## 2015-07-12 MED ORDER — CEPHALEXIN 500 MG PO CAPS: 500.0000 mg | ORAL_CAPSULE | Freq: Three times a day (TID) | ORAL | Status: DC

## 2015-07-12 MED ORDER — METOPROLOL TARTRATE 12.5 MG HALF TABLET
12.5000 mg | ORAL_TABLET | Freq: Two times a day (BID) | ORAL | Status: DC
  Administered 2015-07-12: 12.5 mg via ORAL
  Filled 2015-07-12: qty 1

## 2015-07-12 NOTE — Progress Notes (Addendum)
Patient in stable condition, discharge instructions done with patient and family they verbalised understanding, patient belongings at bedside, tele dc, ccmd notified, patient taken to [facility] by the EMS, this RN attempted to give report to the nurse at [facility] but call was not answered

## 2015-07-12 NOTE — Progress Notes (Addendum)
      [address]       [place] [address]             [phone]        8 Days Post-Op Procedure(s) (LRB): CORONARY ARTERY BYPASS GRAFTING (CABG) x 4 (N/A) TRANSESOPHAGEAL ECHOCARDIOGRAM (TEE) (N/A)  Subjective: Patient without complaints.  Objective: Vital signs in last 24 hours: Temp:  [97.7 F (36.5 C)-98.6 F (37 C)] 98.1 F (36.7 C) (06/28 0655) Pulse Rate:  [77-84] 77 (06/28 0655) Cardiac Rhythm:  [-] Normal sinus rhythm (06/27 1918) Resp:  [16-18] 16 (06/27 2021) BP: (94-119)/(56-70) 94/70 mmHg (06/28 0655) SpO2:  [97 %-98 %] 97 % (06/28 0655)  Pre op weight 80 kg Current Weight  07/10/15 167 lb 12.8 oz (76.114 kg)      Intake/Output from previous day: 06/27 0701 - 06/28 0700 In: 480 [P.O.:480] Out: -    Physical Exam:  Cardiovascular: Slightly bradycardic Pulmonary: Mostly clear Abdomen: Soft, non tender, bowel sounds present. Extremities: No lower extremity edema. Decreasing ecchymosis RLE Wounds: Proximal sternal wound has sero sanguinous ooze, but no surrounding erythema. RLE wounds are clean and dry.  Lab Results: CBC:  Recent Labs  07/10/15 0230  WBC 9.3  HGB 9.9*  HCT 31.7*  PLT 274   BMET:   Recent Labs  07/10/15 0230  NA 139  K 4.1  CL 102  CO2 29  GLUCOSE 95  BUN 14  CREATININE 1.31*  CALCIUM 9.3    PT/INR:  Lab Results  Component Value Date   INR 1.43 07/04/2015   INR 1.05 06/28/2015   INR 1.00 06/15/2015   ABG:  INR: Will add last result for INR, ABG once components are confirmed Will add last 4 CBG results once components are confirmed  Assessment/Plan:  1. CV - SB in the 50's. On Lopressor 25 mg bid. Will decrease to 12.5 mg bid. 2.  Pulmonary - On room air. .Encourage incentive spirometer. 3.  Acute blood loss anemia - Last H and H 9.2 and 28.7 4. Thrombocytopenia resolved-platelets up to 274,000 5 .DM-CBGs 102/120/96. On Insulin. Pre op HGA1C 9.7. He will need close medical follow up  after discharge 6. Some sero sanguinous ooze proximal sternal incision. No erythema. Per Dr. Prescott Gum, start Keflex and do daily dressing changes. 7. Dr. Prescott Gum did a peer to peer and patient will be discharged to SNF today.  ZIMMERMAN,DONIELLE MPA-C 07/12/2015,8:09 AM     patient examined and medical record reviewed,agree with above note. Tharon Aquas Trigt III 07/12/2015

## 2015-07-12 NOTE — Progress Notes (Signed)
CARDIAC REHAB PHASE I   PRE:  Rate/Rhythm: 74 SR  BP:  Sitting: 121/65        SaO2: 94 RA  MODE:  Ambulation: 550 ft   POST:  Rate/Rhythm: 101 ST  BP:  Sitting: 122/69         SaO2: 96 RA  Pt fairly independent, required minimal assistance to transition from lying to sitting position in bed, minimal cues for sternal precaution. Pt ambulated 550 ft on RA, rolling walker, assist x1, fairly steady gait, tolerated well. Pt c/o mild dizziness, states improved with distance. Pt to edge of bed after walk per pt request, fiancee at bedside, call bell within reach. Pt hopeful for discharge today.   [id number] Lenna Sciara, RN, BSN 07/12/2015 9:16 AM

## 2015-07-12 NOTE — Care Management Important Message (Signed)
Important Message  Patient Details  Name: Joseph Sanford MRN: [id number] Date of Birth: [birth year]   Medicare Important Message Given:  Yes    Loann Quill 07/12/2015, 10:01 AM

## 2015-07-12 NOTE — Clinical Social Work Placement (Signed)
   CLINICAL SOCIAL WORK PLACEMENT  NOTE  Date:  07/12/2015  Patient Details  Name: Joseph Sanford MRN: [id number] Date of Birth: 10/13/1945  Clinical Social Work is seeking post-discharge placement for this patient at the [place] level of care (*CSW will initial, date and re-position this form in  chart as items are completed):  Yes   Patient/family provided with [place] Work Department's list of facilities offering this level of care within the geographic area requested by the patient (or if unable, by the patient's family).  Yes   Patient/family informed of their freedom to choose among providers that offer the needed level of care, that participate in Medicare, Medicaid or managed care program needed by the patient, have an available bed and are willing to accept the patient.  Yes   Patient/family informed of 's ownership interest in [facility] and [facility], as well as of the fact that they are under no obligation to receive care at these facilities.  PASRR submitted to EDS on 07/07/15     PASRR number received on 07/07/15     Existing PASRR number confirmed on       FL2 transmitted to all facilities in geographic area requested by pt/family on 07/12/15     FL2 transmitted to all facilities within larger geographic area on       Patient informed that his/her managed care company has contracts with or will negotiate with certain facilities, including the following:        Yes   Patient/family informed of bed offers received.  Patient chooses bed at [facility]     Physician recommends and patient chooses bed at      Patient to be transferred to [facility] on  .  Patient to be transferred to facility by Ambulance     Patient family notified on 07/12/15 of transfer.  Name of family member notified:  Gaut,Patrick     PHYSICIAN Please prepare priority discharge summary, including medications, Please sign FL2,  Please prepare prescriptions     Additional Comment:  [facility], patient, patient's family, and facility notified of discharge. RN given phone number for report and transport packet is on patient's chart. Ambulance transport requested. CSW signing off.   _______________________________________________ Samule Dry, LCSW 07/12/2015, 11:30 AM

## 2015-07-14 ENCOUNTER — Encounter: Payer: Self-pay | Admitting: Internal Medicine

## 2015-07-14 ENCOUNTER — Non-Acute Institutional Stay (SKILLED_NURSING_FACILITY): Payer: Commercial Managed Care - HMO | Admitting: Internal Medicine

## 2015-07-14 DIAGNOSIS — Z8673 Personal history of transient ischemic attack (TIA), and cerebral infarction without residual deficits: Secondary | ICD-10-CM | POA: Diagnosis not present

## 2015-07-14 DIAGNOSIS — N183 Chronic kidney disease, stage 3 unspecified: Secondary | ICD-10-CM

## 2015-07-14 DIAGNOSIS — M79601 Pain in right arm: Secondary | ICD-10-CM | POA: Diagnosis not present

## 2015-07-14 DIAGNOSIS — I10 Essential (primary) hypertension: Secondary | ICD-10-CM | POA: Diagnosis not present

## 2015-07-14 DIAGNOSIS — R5381 Other malaise: Secondary | ICD-10-CM | POA: Diagnosis not present

## 2015-07-14 DIAGNOSIS — N289 Disorder of kidney and ureter, unspecified: Secondary | ICD-10-CM | POA: Diagnosis not present

## 2015-07-14 DIAGNOSIS — Z794 Long term (current) use of insulin: Secondary | ICD-10-CM | POA: Diagnosis not present

## 2015-07-14 DIAGNOSIS — I2510 Atherosclerotic heart disease of native coronary artery without angina pectoris: Secondary | ICD-10-CM

## 2015-07-14 DIAGNOSIS — D62 Acute posthemorrhagic anemia: Secondary | ICD-10-CM

## 2015-07-14 DIAGNOSIS — E785 Hyperlipidemia, unspecified: Secondary | ICD-10-CM

## 2015-07-14 DIAGNOSIS — E1122 Type 2 diabetes mellitus with diabetic chronic kidney disease: Secondary | ICD-10-CM | POA: Diagnosis not present

## 2015-07-14 NOTE — Progress Notes (Signed)
LOCATION: [facility]  PCP: Mayra Neer, MD   Code Status: Full Code  Goals of care: Advanced Directive information Advanced Directives 07/07/2015  Does patient have an advance directive? Yes  Type of Paramedic of [place];Living will  Does patient want to make changes to advanced directive? No - Patient declined  Copy of advanced directive(s) in chart? No - copy requested     Extended Emergency Contact Information Primary Emergency Contact: [place],  [address] [place] [employer] Phone: [phone] Relation: Son   Allergies  Allergen Reactions  . Contrast Media [Iodinated Diagnostic Agents] Other (See Comments)    "bumps on arm"  . Lisinopril Other (See Comments)    cough  . Lipitor [Atorvastatin] Other (See Comments)    Brain fog  . Metformin And Related Other (See Comments)    "STOMACH UPSET"  . Iodine Nausea And Vomiting  . Shellfish Allergy Nausea And Vomiting    Reaction to shrimp    Chief Complaint  Patient presents with  . New Admit To SNF    New Admission     HPI:  Patient is a 70 y.o. male seen today for short term rehabilitation post hospital admission from 07/04/15-07/10/15 with CAD. He underwent cardiac catheterization and CABG X 4 with endoscopic harvest of right leg great saphenous vein graft. He is seen in his room today. He complaints of aches to his right arm with occasional numbness to right little finger  Review of Systems:  Constitutional: Negative for fever, chills, diaphoresis.  HENT: Negative for headache, congestion, nasal discharge, sore throat. Positive for difficulty swallowing big pills.   Eyes: Negative for blurred vision, double vision and discharge.  Respiratory: Negative for cough, shortness of breath and wheezing.   Cardiovascular: Negative for chest pain,leg swelling. Positive for chest wall soreness. Occasional palpitations.  Gastrointestinal: Negative for  heartburn, vomiting, abdominal pain. Positive for occasional nausea. Last bowel movement was 3 days ago.  Genitourinary: Negative for dysuria and flank pain.  Musculoskeletal: Negative for back pain, fall in the facility.  Skin: Negative for itching, rash.  Neurological: Positive for occasional dizziness with change of position. Psychiatric/Behavioral: Negative for depression   Past Medical History  Diagnosis Date  . Coronary artery disease   . Hyperlipidemia   . ED (erectile dysfunction)   . CVA (cerebral infarction)     08/2011, NO RESIDUAL DEFICITS  . Chronic kidney disease   . Myocardial infarction ([facility])     1997  . Hypertension   . Irregular heart beat     "a long time ago"  . Diabetes mellitus without complication ([facility])     type II  . History of kidney stones   . Gout    Past Surgical History  Procedure Laterality Date  . Cardiac catheterization N/A 06/20/2015    Procedure: Left Heart Cath and Coronary Angiography;  Surgeon: Belva Crome, MD;  Location: [facility];  Service: Cardiovascular;  Laterality: N/A;  . Cardiac catheterization  1997    angioplasty  . Cardiac catheterization  2010    stents placed  . Knee surgery Right     some type of knee cap surgery per pt  . Cataract extraction w/ intraocular lens  implant, bilateral    . Colonoscopy    . Multiple tooth extractions    . Coronary artery bypass graft N/A 07/04/2015    Procedure: CORONARY ARTERY BYPASS GRAFTING (CABG)  x 4;  Surgeon: Ivin Poot, MD;  Location: [facility];  Service: Open Heart Surgery;  Laterality: N/A;  . Tee without cardioversion N/A 07/04/2015    Procedure: TRANSESOPHAGEAL ECHOCARDIOGRAM (TEE);  Surgeon: Ivin Poot, MD;  Location: [facility];  Service: Open Heart Surgery;  Laterality: N/A;   Social History:   reports that he has never smoked. He has never used smokeless tobacco. He reports that he does not drink alcohol or use illicit drugs.  Family History  Problem Relation Age of  Onset  . Diabetes Brother     Medications:   Medication List       This list is accurate as of: 07/14/15 12:16 PM.  Always use your most recent med list.               aspirin EC 325 MG tablet  Take 1 tablet (325 mg total) by mouth daily.     B-complex with vitamin C tablet  Take 1 tablet by mouth daily.     CALCIUM 600 + D PO  Take 600 mg by mouth daily.     cephALEXin 500 MG capsule  Commonly known as:  KEFLEX  Take 1 capsule (500 mg total) by mouth every 8 (eight) hours.     CoQ10 50 MG Caps  Take 50 mg by mouth daily.     Fish Oil 1200 MG Caps  Take 1 capsule by mouth every 30 (thirty) days.     Folic Acid 0.8 MG Caps  Take 1 capsule by mouth daily.     Garlic 123XX123 MG Caps  Take 1,000 mg by mouth daily.     insulin aspart 100 UNIT/ML FlexPen  Commonly known as:  NOVOLOG FLEXPEN  Inject 10 Units into the skin daily with supper. And pen needles 2/day     Insulin Glargine 100 UNIT/ML Solostar Pen  Commonly known as:  LANTUS SOLOSTAR  Inject 40 Units into the skin every morning.     Magnesium 250 MG Tabs  Take 250 mg by mouth daily.     metoprolol succinate 25 MG 24 hr tablet  Commonly known as:  TOPROL-XL  Take 1 tablet (25 mg total) by mouth daily.     oxyCODONE 5 MG immediate release tablet  Commonly known as:  Oxy IR/ROXICODONE  Take 1 tablet (5 mg total) by mouth every 3 (three) hours as needed for severe pain.     polyethylene glycol packet  Commonly known as:  MIRALAX / GLYCOLAX  Take 17 g by mouth daily.     simvastatin 40 MG tablet  Commonly known as:  ZOCOR  Take 40 mg by mouth daily.     Vitamin B-12 5000 MCG Tbdp  Take 5,000 mcg by mouth daily.     VITAMIN D3 SUPER STRENGTH PO  Take 1 tablet by mouth daily.     Zinc 50 MG Caps  Take 50 mg by mouth daily.        Immunizations: Immunization History  Administered Date(s) Administered  . DTaP 06/21/2010  . Influenza-Unspecified 09/27/2014  . PPD Test 07/12/2015  .  Pneumococcal Conjugate-13 12/30/2013  . Pneumococcal Polysaccharide-23 12/14/2001, 09/03/2011  . Zoster 09/06/2010     Physical Exam: Filed Vitals:   07/14/15 1209  BP: 126/67  Pulse: 85  Temp: 97.4 F (36.3 C)  TempSrc: Oral  Resp: 20  Height: 5' 10.5" (1.791 m)  Weight: 168 lb (76.204 kg)   Body mass index is 23.76 kg/(m^2).  General- elderly  male, well built, in no acute distress Head- normocephalic, atraumatic Nose- no maxillary or frontal sinus tenderness, no nasal discharge Throat- moist mucus membrane  Eyes- PERRLA, EOMI, no pallor, no icterus, no discharge, normal conjunctiva, normal sclera Neck- no cervical lymphadenopathy Cardiovascular- normal s1,s2, no murmur, trace leg edema Respiratory- bilateral clear to auscultation, no wheeze, no rhonchi, no crackles, no use of accessory muscles Abdomen- bowel sounds present, soft, non tender Musculoskeletal- able to move all 4 extremities, generalized weakness Neurological- alert and oriented to person, place and time Skin- warm and dry, sternal incision healing well with dressing to tip of incision. Steri strip to incision on upper abdominal wall. Incision to right thigh healing well, bruise to right leg Psychiatry- normal mood and affect    Labs reviewed: Basic Metabolic Panel:  Recent Labs  07/04/15 1920  07/05/15 0420 07/05/15 1745  07/07/15 0400 07/08/15 0402 07/10/15 0230  NA  --   < > 139  --   < > 135 138 139  K  --   < > 4.1  --   < > 3.8 3.5 4.1  CL  --   < > 110  --   < > 101 105 102  CO2  --   --  25  --   < > 30 27 29   GLUCOSE  --   < > 96  --   < > 92 50* 95  BUN  --   < > 9  --   < > 13 14 14   CREATININE 0.91  < > 0.95 1.10  < > 1.26* 1.17 1.31*  CALCIUM  --   --  8.1*  --   < > 8.5* 8.7* 9.3  MG 2.7*  --  2.1 1.9  --   --   --   --   < > = values in this interval not displayed. Liver Function Tests:  Recent Labs  06/28/15 1257  AST 23  ALT 24  ALKPHOS 88  BILITOT 0.5  PROT 6.7    ALBUMIN 3.6   No results for input(s): LIPASE, AMYLASE in the last 8760 hours. No results for input(s): AMMONIA in the last 8760 hours. CBC:  Recent Labs  06/15/15 1305  07/06/15 0405 07/07/15 0400 07/10/15 0230  WBC 5.9  < > 15.9* 13.2* 9.3  NEUTROABS 2773  --   --   --   --   HGB 12.9*  < > 9.8* 9.2* 9.9*  HCT 40.0  < > 30.7* 28.7* 31.7*  MCV 83.0  < > 80.8 80.8 81.9  PLT 250  < > 125* 138* 274  < > = values in this interval not displayed. Cardiac Enzymes: No results for input(s): CKTOTAL, CKMB, CKMBINDEX, TROPONINI in the last 8760 hours. BNP: Invalid input(s): POCBNP CBG:  Recent Labs  07/11/15 2038 07/12/15 0609 07/12/15 1142  GLUCAP 120* 96 111*    Radiological Exams: Dg Chest 2 View  07/07/2015  CLINICAL DATA:  Status post CABG 3 days ago EXAM: CHEST  2 VIEW COMPARISON:  Portable chest x-ray of July 06, 2015 FINDINGS: There has been interval removal of the left-sided chest tube and the right internal jugular Cordis sheath. The lungs are adequately inflated. There is no focal infiltrate or pneumothorax. Traces of pleural fluid blunt the costophrenic angles. The cardiac silhouette is mildly enlarged but stable. The pulmonary vascularity is normalized. The sternal wires are intact. The retrosternal soft tissues appear normal. IMPRESSION: Small bilateral pleural effusions blunting the costophrenic  angles. Interval resolution of mild interstitial edema. Stable cardiomegaly. Electronically Signed   By: David  Martinique M.D.   On: 07/07/2015 08:17   Dg Chest 2 View  06/28/2015  CLINICAL DATA:  CABG.  Preoperative exam. EXAM: CHEST  2 VIEW COMPARISON:  11/05/2008. FINDINGS: Mediastinum hilar structures normal. Lungs are clear. Mild cardiomegaly. No pulmonary venous congestion. No focal infiltrate. No pleural effusion or pneumothorax. Mild bilateral pleural thickening noted consistent scarring. Exam stable from prior exam. IMPRESSION: Stable mild cardiomegaly.  No acute pulmonary  disease. Electronically Signed   By: Marcello Moores  Register   On: 06/28/2015 14:10   Dg Chest Port 1 View  07/06/2015  CLINICAL DATA:  Chest discomfort status post CABG EXAM: PORTABLE CHEST 1 VIEW COMPARISON:  Portable chest x-ray of July 05, 2015 FINDINGS: There has been interval removal of the Swan-Ganz catheter. The left-sided chest tube is in stable position. There is no pneumothorax nor significant pleural effusion. There is minimal bibasilar atelectasis. The cardiac silhouette remains enlarged. The pulmonary vascularity is not engorged. The right internal jugular Cordis sheath tip projects at the junction of the right internal jugular vein with the right subclavian vein. The sternal wires are intact. IMPRESSION: Interval decrease in pulmonary interstitial edema. No pneumothorax or significant pleural effusion. The remaining support apparatus are in stable position. Electronically Signed   By: David  Martinique M.D.   On: 07/06/2015 07:45   Dg Chest Port 1 View  07/05/2015  CLINICAL DATA:  Post CABG EXAM: PORTABLE CHEST 1 VIEW COMPARISON:  07/04/2015 FINDINGS: Cardiomediastinal silhouette is stable. Status post CABG. Stable right IJ Swan-Ganz catheter position endotracheal tube and NG tube has been removed. Stable left chest tube position. There is no pneumothorax. Trace left pleural effusion left basilar atelectasis. IMPRESSION: Status post CABG. Stable right IJ Swan-Ganz catheter position endotracheal tube and NG tube has been removed. Stable left chest tube position. There is no pneumothorax. Trace left pleural effusion left basilar atelectasis. Electronically Signed   By: Lahoma Crocker M.D.   On: 07/05/2015 08:10   Dg Chest Port 1 View  07/04/2015  CLINICAL DATA:  Postop from CABG. Coronary artery disease. Previous myocardial infarction and stroke. EXAM: PORTABLE CHEST 1 VIEW COMPARISON:  06/28/2015 FINDINGS: Patient has undergone interval CABG. Swan-Ganz catheter tip is seen in the right interlobar pulmonary  artery. Endotracheal tube, nasogastric tube, and left-sided chest tube are seen in appropriate position. Low lung volumes are seen with mild bibasilar atelectasis. No evidence of pneumothorax or pleural effusion. Heart size is within normal limits. IMPRESSION: Postoperative chest with bibasilar atelectasis. No pneumothorax visualized. Electronically Signed   By: Earle Gell M.D.   On: 07/04/2015 14:30    Assessment/Plan  Physical deconditioning Will have him work with physical therapy and occupational therapy team to help with gait training and muscle strengthening exercises.fall precautions. Skin care. Encourage to be out of bed.   CAD S/p catheterization and CABG. Continue aspirin ec 325 mg daily, metoprolol succinate 50 mg daily and simvastatin 40 mg daily. Has f/u with cardiology and cardiothoracic surgery  Impaired renal function Monitor bmp  Blood loss anemia Post op, monitor cbc  Right arm pain Currently on oxycodone ir 5 mg q3h prn pain. Appears to be musculoskeletal. Start tylenol 500 mg bid and change oxy IR to 5 mg q8h prn and monitor. Monitor for signs of numbness and tingling. With him on statin, check ck  HTN Continue toprol xl and monitor BP  HLD Lipid Panel     Component  Value Date/Time   CHOL  11/05/2008 0159    91        ATP III CLASSIFICATION:  <200     mg/dL   Desirable  200-239  mg/dL   Borderline High  >=240    mg/dL   High          TRIG 156* 11/05/2008 0159   HDL 32* 11/05/2008 0159   CHOLHDL 2.8 11/05/2008 0159   VLDL 31 11/05/2008 0159   LDLCALC  11/05/2008 0159    28        Total Cholesterol/HDL:CHD Risk Coronary Heart Disease Risk Table                     Men   Women  1/2 Average Risk   3.4   3.3  Average Risk       5.0   4.4  2 X Average Risk   9.6   7.1  3 X Average Risk  23.4   11.0        Use the calculated Patient Ratio above and the CHD Risk Table to determine the patient's CHD Risk.        ATP III CLASSIFICATION (LDL):  <100      mg/dL   Optimal  100-129  mg/dL   Near or Above                    Optimal  130-159  mg/dL   Borderline  160-189  mg/dL   High  >190     mg/dL   Very High   Check lipid panel. Continue simvastatin 40 mg daily  History of CVA Continue aspirin and monitor clinically. Monitor bp  DM Lab Results  Component Value Date   HGBA1C 9.7* 06/28/2015   Monitor cbg, continue lantus 40 u daily and novolog 10 u with dinner  ckd stage 3 Monitor bmp   Goals of care: short term rehabilitation   Labs/tests ordered: cbc, cmp, lipid panel, ck 07/17/15  Family/ staff Communication: reviewed care plan with patient and nursing supervisor    Blanchie Serve, MD Internal Medicine [place], [place] [address] Cell Phone (Monday-Friday 8 am - 5 pm): [phone] On Call: [phone] and follow prompts after 5 pm and on weekends Office Phone: [phone] Office Fax: [phone]

## 2015-07-24 ENCOUNTER — Non-Acute Institutional Stay (SKILLED_NURSING_FACILITY): Payer: Commercial Managed Care - HMO | Admitting: Family

## 2015-07-24 DIAGNOSIS — Z951 Presence of aortocoronary bypass graft: Secondary | ICD-10-CM | POA: Diagnosis not present

## 2015-07-24 DIAGNOSIS — R269 Unspecified abnormalities of gait and mobility: Secondary | ICD-10-CM | POA: Diagnosis not present

## 2015-07-24 DIAGNOSIS — K5901 Slow transit constipation: Secondary | ICD-10-CM

## 2015-07-24 DIAGNOSIS — I2510 Atherosclerotic heart disease of native coronary artery without angina pectoris: Secondary | ICD-10-CM

## 2015-07-24 DIAGNOSIS — I10 Essential (primary) hypertension: Secondary | ICD-10-CM

## 2015-07-24 DIAGNOSIS — K5900 Constipation, unspecified: Secondary | ICD-10-CM | POA: Insufficient documentation

## 2015-07-24 DIAGNOSIS — E785 Hyperlipidemia, unspecified: Secondary | ICD-10-CM | POA: Diagnosis not present

## 2015-07-24 DIAGNOSIS — Z8673 Personal history of transient ischemic attack (TIA), and cerebral infarction without residual deficits: Secondary | ICD-10-CM

## 2015-07-24 NOTE — Progress Notes (Signed)
Patient ID: Joseph Sanford, male   DOB: [birth year], 70 y.o.   MRN: [id number]  Location:   [facility] and rehab  Room 1108  Place of Service:  SNF (31)  Provider: Marlowe Sax FNP-C   PCP: Mayra Neer, MD Patient Care Team: Mayra Neer, MD as PCP - General (Family Medicine)  Extended Emergency Contact Information Primary Emergency Contact: [place], [place] [address] [place] Mobile Phone: [phone] Relation: Son  Code Status: Full Code  Goals of care:  Advanced Directive information Advanced Directives 07/07/2015  Does patient have an advance directive? Yes  Type of Paramedic of [place];Living will  Does patient want to make changes to advanced directive? No - Patient declined  Copy of advanced directive(s) in chart? No - copy requested     Allergies  Allergen Reactions  . Contrast Media [Iodinated Diagnostic Agents] Other (See Comments)    "bumps on arm"  . Lisinopril Other (See Comments)    cough  . Lipitor [Atorvastatin] Other (See Comments)    Brain fog  . Metformin And Related Other (See Comments)    "STOMACH UPSET"  . Iodine Nausea And Vomiting  . Shellfish Allergy Nausea And Vomiting    Reaction to shrimp    Chief Complaint  Patient presents with  . Discharge Note    HPI:  70 y.o. male  Seen today at [facility] and rehab for discharge home. He was here for short term rehabilitation post hospital admission from 07/04/15-07/10/15 with CAD. He underwent cardiac catheterization and CABG X 4 with endoscopic harvest of right leg great saphenous vein graft. He is seen in his room today. He states right arm pain under control with current pain regimen. He has worked well with PT/OT now stable for discharge home.He will be discharged home with Home health PT/OT to continue with ROM, Exercise, Gait stability and muscle strengthening.He will also need heart care program due to status post CABG  x 4. He will require a 3-1 to allow him to safely transfer on the commode.He has own FWW ordered from the [facility] will be arranged by facility social worker prior to discharge. Prescription medication will be written x 1 month then patient to follow up with PCP in 1-2 weeks. He denies any acute issues this visit. Facility staff report no new concerns.      Past Medical History  Diagnosis Date  . Coronary artery disease   . Hyperlipidemia   . ED (erectile dysfunction)   . CVA (cerebral infarction)     08/2011, NO RESIDUAL DEFICITS  . Chronic kidney disease   . Myocardial infarction ([place])     1997  . Hypertension   . Irregular heart beat     "a long time ago"  . Diabetes mellitus without complication ([place])     type II  . History of kidney stones   . Gout     Past Surgical History  Procedure Laterality Date  . Cardiac catheterization N/A 06/20/2015    Procedure: Left Heart Cath and Coronary Angiography;  Surgeon: Belva Crome, MD;  Location: [place] CV LAB;  Service: Cardiovascular;  Laterality: N/A;  . Cardiac catheterization  1997    angioplasty  . Cardiac catheterization  2010    stents placed  . Knee surgery Right     some type of knee cap surgery per pt  . Cataract extraction  w/ intraocular lens  implant, bilateral    . Colonoscopy    . Multiple tooth extractions    . Coronary artery bypass graft N/A 07/04/2015    Procedure: CORONARY ARTERY BYPASS GRAFTING (CABG) x 4;  Surgeon: Ivin Poot, MD;  Location: [place];  Service: Open Heart Surgery;  Laterality: N/A;  . Tee without cardioversion N/A 07/04/2015    Procedure: TRANSESOPHAGEAL ECHOCARDIOGRAM (TEE);  Surgeon: Ivin Poot, MD;  Location: [place];  Service: Open Heart Surgery;  Laterality: N/A;      reports that he has never smoked. He has never used smokeless tobacco. He reports that he does not drink alcohol or use illicit drugs. Social History   Social History  . Marital Status:  Widowed    Spouse Name: N/A  . Number of Children: N/A  . Years of Education: N/A   Occupational History  . Not on file.   Social History Main Topics  . Smoking status: Never Smoker   . Smokeless tobacco: Never Used  . Alcohol Use: No  . Drug Use: No  . Sexual Activity: Not on file   Other Topics Concern  . Not on file   Social History Narrative   Functional Status Survey:    Allergies  Allergen Reactions  . Contrast Media [Iodinated Diagnostic Agents] Other (See Comments)    "bumps on arm"  . Lisinopril Other (See Comments)    cough  . Lipitor [Atorvastatin] Other (See Comments)    Brain fog  . Metformin And Related Other (See Comments)    "STOMACH UPSET"  . Iodine Nausea And Vomiting  . Shellfish Allergy Nausea And Vomiting    Reaction to shrimp    Pertinent  Health Maintenance Due  Topic Date Due  . OPHTHALMOLOGY EXAM  06/09/1956  . URINE MICROALBUMIN  06/09/1956  . COLONOSCOPY  06/09/1996  . INFLUENZA VACCINE  08/15/2015  . HEMOGLOBIN A1C  12/28/2015  . FOOT EXAM  06/12/2016  . PNA vac Low Risk Adult  Completed    Medications:   Medication List       This list is accurate as of: 07/24/15  2:56 PM.  Always use your most recent med list.               aspirin EC 325 MG tablet  Take 1 tablet (325 mg total) by mouth daily.     B-complex with vitamin C tablet  Take 1 tablet by mouth daily.     CALCIUM 600 + D PO  Take 600 mg by mouth daily.     CoQ10 50 MG Caps  Take 50 mg by mouth daily.     Fish Oil 1200 MG Caps  Take 1 capsule by mouth every 30 (thirty) days.     Folic Acid 0.8 MG Caps  Take 1 capsule by mouth daily.     Garlic 123XX123 MG Caps  Take 1,000 mg by mouth daily.     insulin aspart 100 UNIT/ML FlexPen  Commonly known as:  NOVOLOG FLEXPEN  Inject 10 Units into the skin daily with supper. And pen needles 2/day     Insulin Glargine 100 UNIT/ML Solostar Pen  Commonly known as:  LANTUS SOLOSTAR  Inject 40 Units into the skin  every morning.     Magnesium 250 MG Tabs  Take 250 mg by mouth daily.     metoprolol succinate 25 MG 24 hr tablet  Commonly known as:  TOPROL-XL  Take 1 tablet (25 mg total)  by mouth daily.     oxyCODONE 5 MG immediate release tablet  Commonly known as:  Oxy IR/ROXICODONE  Take 1 tablet (5 mg total) by mouth every 3 (three) hours as needed for severe pain.     polyethylene glycol packet  Commonly known as:  MIRALAX / GLYCOLAX  Take 17 g by mouth daily.     sennosides-docusate sodium 8.6-50 MG tablet  Commonly known as:  SENOKOT-S  Take 2 tablets by mouth at bedtime.     simvastatin 40 MG tablet  Commonly known as:  ZOCOR  Take 40 mg by mouth daily.     Vitamin B-12 5000 MCG Tbdp  Take 5,000 mcg by mouth daily.     VITAMIN D3 SUPER STRENGTH PO  Take 1 tablet by mouth daily.     Zinc 50 MG Caps  Take 50 mg by mouth daily.        Review of Systems  Constitutional: Negative for fever, chills, activity change, appetite change and fatigue.  HENT: Negative for congestion, rhinorrhea, sinus pressure, sneezing and sore throat.   Eyes: Negative.   Respiratory: Negative for cough, chest tightness and wheezing.   Cardiovascular: Negative for chest pain, palpitations and leg swelling.       Status post CABG X 4   Gastrointestinal: Negative for nausea, vomiting, abdominal pain, diarrhea, constipation and abdominal distention.  Genitourinary: Negative for dysuria, urgency, frequency and flank pain.  Musculoskeletal: Positive for gait problem.       Right arm pain   Skin:       Mid chest surgical incision   Neurological: Negative for dizziness, seizures, syncope, light-headedness, numbness and headaches.  Hematological: Does not bruise/bleed easily.  Psychiatric/Behavioral: Negative for hallucinations, confusion, sleep disturbance and agitation. The patient is not nervous/anxious.     Filed Vitals:   07/24/15 1451  BP: 124/64  Pulse: 58  Temp: 98 F (36.7 C)  Resp: 18    Height: 5\' 10"  (1.778 m)  Weight: 166 lb 6.4 oz (75.479 kg)  SpO2: 95%   Body mass index is 23.88 kg/(m^2). Physical Exam  Constitutional: He is oriented to person, place, and time. He appears well-developed and well-nourished. No distress.  HENT:  Head: Normocephalic.  Mouth/Throat: Oropharynx is clear and moist. No oropharyngeal exudate.  Eyes: Conjunctivae and EOM are normal. Pupils are equal, round, and reactive to light. Right eye exhibits no discharge. Left eye exhibits no discharge. No scleral icterus.  Neck: Normal range of motion. No JVD present. No thyromegaly present.  Cardiovascular: Normal rate, regular rhythm, normal heart sounds and intact distal pulses.  Exam reveals no gallop and no friction rub.   No murmur heard. Pulmonary/Chest: Effort normal and breath sounds normal. No respiratory distress. He has no wheezes. He has no rales.  Abdominal: Soft. Bowel sounds are normal. He exhibits no distension. There is no tenderness. There is no rebound and no guarding.  Musculoskeletal: Normal range of motion. He exhibits no edema or tenderness.  Lymphadenopathy:    He has no cervical adenopathy.  Neurological: He is oriented to person, place, and time.  Skin: Skin is warm and dry. No rash noted. No erythema. No pallor.  Mid chest Surgical incision intact surrounding skin without any signs of infections.   Psychiatric: He has a normal mood and affect.    Labs reviewed: Basic Metabolic Panel:  Recent Labs  07/04/15 1920  07/05/15 0420 07/05/15 1745  07/07/15 0400 07/08/15 0402 07/10/15 0230  NA  --   < >  139  --   < > 135 138 139  K  --   < > 4.1  --   < > 3.8 3.5 4.1  CL  --   < > 110  --   < > 101 105 102  CO2  --   --  25  --   < > 30 27 29   GLUCOSE  --   < > 96  --   < > 92 50* 95  BUN  --   < > 9  --   < > 13 14 14   CREATININE 0.91  < > 0.95 1.10  < > 1.26* 1.17 1.31*  CALCIUM  --   --  8.1*  --   < > 8.5* 8.7* 9.3  MG 2.7*  --  2.1 1.9  --   --   --   --   <  > = values in this interval not displayed. Liver Function Tests:  Recent Labs  06/28/15 1257  AST 23  ALT 24  ALKPHOS 88  BILITOT 0.5  PROT 6.7  ALBUMIN 3.6   No results for input(s): LIPASE, AMYLASE in the last 8760 hours. No results for input(s): AMMONIA in the last 8760 hours. CBC:  Recent Labs  06/15/15 1305  07/06/15 0405 07/07/15 0400 07/10/15 0230  WBC 5.9  < > 15.9* 13.2* 9.3  NEUTROABS 2773  --   --   --   --   HGB 12.9*  < > 9.8* 9.2* 9.9*  HCT 40.0  < > 30.7* 28.7* 31.7*  MCV 83.0  < > 80.8 80.8 81.9  PLT 250  < > 125* 138* 274  < > = values in this interval not displayed. Cardiac Enzymes: No results for input(s): CKTOTAL, CKMB, CKMBINDEX, TROPONINI in the last 8760 hours. BNP: Invalid input(s): POCBNP CBG:  Recent Labs  07/11/15 2038 07/12/15 0609 07/12/15 1142  GLUCAP 120* 96 111*    Procedures and Imaging Studies During Stay: Dg Chest 2 View  07/07/2015  CLINICAL DATA:  Status post CABG 3 days ago EXAM: CHEST  2 VIEW COMPARISON:  Portable chest x-ray of July 06, 2015 FINDINGS: There has been interval removal of the left-sided chest tube and the right internal jugular Cordis sheath. The lungs are adequately inflated. There is no focal infiltrate or pneumothorax. Traces of pleural fluid blunt the costophrenic angles. The cardiac silhouette is mildly enlarged but stable. The pulmonary vascularity is normalized. The sternal wires are intact. The retrosternal soft tissues appear normal. IMPRESSION: Small bilateral pleural effusions blunting the costophrenic angles. Interval resolution of mild interstitial edema. Stable cardiomegaly. Electronically Signed   By: David  Martinique M.D.   On: 07/07/2015 08:17   Dg Chest 2 View  06/28/2015  CLINICAL DATA:  CABG.  Preoperative exam. EXAM: CHEST  2 VIEW COMPARISON:  11/05/2008. FINDINGS: Mediastinum hilar structures normal. Lungs are clear. Mild cardiomegaly. No pulmonary venous congestion. No focal infiltrate. No  pleural effusion or pneumothorax. Mild bilateral pleural thickening noted consistent scarring. Exam stable from prior exam. IMPRESSION: Stable mild cardiomegaly.  No acute pulmonary disease. Electronically Signed   By: Marcello Moores  Register   On: 06/28/2015 14:10   Dg Chest Port 1 View  07/06/2015  CLINICAL DATA:  Chest discomfort status post CABG EXAM: PORTABLE CHEST 1 VIEW COMPARISON:  Portable chest x-ray of July 05, 2015 FINDINGS: There has been interval removal of the Swan-Ganz catheter. The left-sided chest tube is in stable position. There is no pneumothorax nor  significant pleural effusion. There is minimal bibasilar atelectasis. The cardiac silhouette remains enlarged. The pulmonary vascularity is not engorged. The right internal jugular Cordis sheath tip projects at the junction of the right internal jugular vein with the right subclavian vein. The sternal wires are intact. IMPRESSION: Interval decrease in pulmonary interstitial edema. No pneumothorax or significant pleural effusion. The remaining support apparatus are in stable position. Electronically Signed   By: David  Martinique M.D.   On: 07/06/2015 07:45   Dg Chest Port 1 View  07/05/2015  CLINICAL DATA:  Post CABG EXAM: PORTABLE CHEST 1 VIEW COMPARISON:  07/04/2015 FINDINGS: Cardiomediastinal silhouette is stable. Status post CABG. Stable right IJ Swan-Ganz catheter position endotracheal tube and NG tube has been removed. Stable left chest tube position. There is no pneumothorax. Trace left pleural effusion left basilar atelectasis. IMPRESSION: Status post CABG. Stable right IJ Swan-Ganz catheter position endotracheal tube and NG tube has been removed. Stable left chest tube position. There is no pneumothorax. Trace left pleural effusion left basilar atelectasis. Electronically Signed   By: Lahoma Crocker M.D.   On: 07/05/2015 08:10   Dg Chest Port 1 View  07/04/2015  CLINICAL DATA:  Postop from CABG. Coronary artery disease. Previous myocardial  infarction and stroke. EXAM: PORTABLE CHEST 1 VIEW COMPARISON:  06/28/2015 FINDINGS: Patient has undergone interval CABG. Swan-Ganz catheter tip is seen in the right interlobar pulmonary artery. Endotracheal tube, nasogastric tube, and left-sided chest tube are seen in appropriate position. Low lung volumes are seen with mild bibasilar atelectasis. No evidence of pneumothorax or pleural effusion. Heart size is within normal limits. IMPRESSION: Postoperative chest with bibasilar atelectasis. No pneumothorax visualized. Electronically Signed   By: Earle Gell M.D.   On: 07/04/2015 14:30    Assessment/Plan:   1. Essential hypertension B/p stable. Continue on Metoprolol succ 25 mg Tablet. BMP in 1-2 weeks with PCP  2. CAD in native artery Chest pain free. Continue on ASA   3. S/P CABG x 4 Status post  short term rehabilitation post hospital admission from 07/04/15-07/10/15 with CAD. He underwent cardiac catheterization and CABG X 4 with endoscopic harvest of right leg great saphenous vein graft.Mid chest surgical incision without any signs of infections. Has worked well with PT/OT will discharge home with PT/OT to continue with  ROM, Exercise, Gait stability and muscle strengthening.continue Metoprolol succ 25 mg tablet and Simvastatin 40 mg Tablet. Heart Care Program. Follow up with Cardiothoracic Surgery 08/09/2015 DR. Trigit.  Tel # [phone]  4. Hyperlipidemia continue Simvastatin 40 mg Tablet.  5. History of CVA (cerebrovascular accident) Continue ASA CE 325 mg Tablet and Simvastatin 40 mg Tablet   6. Slow transit constipation Continue on Senna-S and Miralax 17 g daily   7. Abnormality of gait He has worked well with PT/OT will discharge home with PT/OT to continue with  ROM, Exercise, Gait stability and muscle strengthening.Has own FWW. Fall and safety precautions.     Patient is being discharged with the following home health services:     PT/OT to continue with  ROM, Exercise, Gait  stability and muscle strengthening.  Patient is being discharged with the following durable medical equipment:   3-1   Patient has been advised to f/u with their PCP in 1-2 weeks to bring them up to date on their rehab stay.  Social services at facility was responsible for arranging this appointment.  Pt was provided with a 30 day supply of prescriptions for medications and refills must be obtained from their  PCP.  For controlled substances, a more limited supply may be provided adequate until PCP appointment only.  Future labs/tests needed:  CBC, BMP in 1-2 weeks with PCP

## 2015-07-26 ENCOUNTER — Encounter: Payer: Self-pay | Admitting: Physician Assistant

## 2015-07-26 DIAGNOSIS — T814XXD Infection following a procedure, subsequent encounter: Secondary | ICD-10-CM | POA: Diagnosis not present

## 2015-07-26 DIAGNOSIS — E119 Type 2 diabetes mellitus without complications: Secondary | ICD-10-CM | POA: Diagnosis not present

## 2015-07-26 DIAGNOSIS — I509 Heart failure, unspecified: Secondary | ICD-10-CM | POA: Diagnosis not present

## 2015-07-26 DIAGNOSIS — I130 Hypertensive heart and chronic kidney disease with heart failure and stage 1 through stage 4 chronic kidney disease, or unspecified chronic kidney disease: Secondary | ICD-10-CM | POA: Diagnosis not present

## 2015-07-26 DIAGNOSIS — R2681 Unsteadiness on feet: Secondary | ICD-10-CM | POA: Diagnosis not present

## 2015-07-26 DIAGNOSIS — I2510 Atherosclerotic heart disease of native coronary artery without angina pectoris: Secondary | ICD-10-CM | POA: Diagnosis not present

## 2015-07-26 DIAGNOSIS — Z48812 Encounter for surgical aftercare following surgery on the circulatory system: Secondary | ICD-10-CM | POA: Diagnosis not present

## 2015-07-26 NOTE — Progress Notes (Signed)
Cardiology Office Note:    Date:  07/27/2015   ID:  Joseph Sanford, DOB [birth year], MRN [id number]  PCP:  Joseph Neer, MD  Cardiologist:  Dr. Daneen Sanford   Electrophysiologist:  n/a  Referring MD: Joseph Neer, MD   Chief Complaint  Patient presents with  . Hospitalization Follow-up    s/p CABG    History of Present Illness:     Joseph Sanford is a 70 y.o. male with a hx of CAD status post prior PCI, prior CVA, HTN, HL, CKD related to diabetes. Last PCI in 8/10 with DES to the PDA. Seen by Dr. Tamala Sanford 6/16.   In May, the patient called in with complaints of chest discomfort and cardiac catheterization was arranged. This demonstrated an EF of 45-50% and 3 vessel CAD. He was evaluated by Dr. Prescott Sanford and felt to be a candidate for CABG.  He was admitted 6/20-6/28 and underwent CABG with LIMA-LAD, SVG-DX, SVG-OM1, SVG-distal PDA. Postoperative course was fairly uneventful. He did have some drainage from his wound and was placed on antibiotics. He was discharged to SNF. Returns for follow-up.  He is here alone today. He is now at home. He denies significant chest soreness. Denies fevers. He does have occasional chills. Denies significant dyspnea. He's working with physical therapy at home. He is slowly increasing his activity. He does remain fatigued. He denies syncope. Denies edema. Denies orthopnea. He does note significant pain in his right hand in the ulnar nerve distribution.  Past Medical History  Diagnosis Date  . Coronary artery disease   . Hyperlipidemia   . ED (erectile dysfunction)   . CVA (cerebral infarction)     08/2011, NO RESIDUAL DEFICITS  . Chronic kidney disease   . Myocardial infarction ([place])     1997  . Hypertension   . Irregular heart beat     "a long time ago"  . Diabetes mellitus without complication ([place])     type II  . History of kidney stones   . Gout     Past Surgical History  Procedure Laterality Date  . Cardiac catheterization N/A 06/20/2015   Procedure: Left Heart Cath and Coronary Angiography;  Surgeon: Joseph Crome, MD;  Location: [facility];  Service: Cardiovascular;  Laterality: N/A;  . Cardiac catheterization  1997    angioplasty  . Cardiac catheterization  2010    stents placed  . Knee surgery Right     some type of knee cap surgery per pt  . Cataract extraction w/ intraocular lens  implant, bilateral    . Colonoscopy    . Multiple tooth extractions    . Coronary artery bypass graft N/A 07/04/2015    Procedure: CORONARY ARTERY BYPASS GRAFTING (CABG) x 4;  Surgeon: Joseph Poot, MD;  Location: [facility];  Service: Open Heart Surgery;  Laterality: N/A;  . Tee without cardioversion N/A 07/04/2015    Procedure: TRANSESOPHAGEAL ECHOCARDIOGRAM (TEE);  Surgeon: Joseph Poot, MD;  Location: [facility];  Service: Open Heart Surgery;  Laterality: N/A;    Current Medications: Outpatient Prescriptions Prior to Visit  Medication Sig Dispense Refill  . aspirin EC 325 MG tablet Take 1 tablet (325 mg total) by mouth daily.    . B Complex-C (B-COMPLEX WITH VITAMIN C) tablet Take 1 tablet by mouth daily.    . Calcium Carb-Cholecalciferol (CALCIUM 600 + D PO) Take 600 mg by mouth daily.    . Cholecalciferol (VITAMIN D3 SUPER STRENGTH PO) Take 1  tablet by mouth daily.    . Coenzyme Q10 (COQ10) 50 MG CAPS Take 50 mg by mouth daily.    . Cyanocobalamin (VITAMIN B-12) 5000 MCG TBDP Take 5,000 mcg by mouth daily.    . Folic Acid 0.8 MG CAPS Take 1 capsule by mouth daily.    . Garlic 123XX123 MG CAPS Take 1,000 mg by mouth daily.    . insulin aspart (NOVOLOG FLEXPEN) 100 UNIT/ML FlexPen Inject 10 Units into the skin daily with supper. And pen needles 2/day 15 mL 11  . Insulin Glargine (LANTUS SOLOSTAR) 100 UNIT/ML Solostar Pen Inject 40 Units into the skin every morning. 15 mL 11  . Magnesium 250 MG TABS Take 250 mg by mouth daily.    Marland Kitchen oxyCODONE (OXY IR/ROXICODONE) 5 MG immediate release tablet Take 1 tablet (5 mg total) by mouth every 3  (three) hours as needed for severe pain. 30 tablet 0  . polyethylene glycol (MIRALAX / GLYCOLAX) packet Take 17 g by mouth daily.    . sennosides-docusate sodium (SENOKOT-S) 8.6-50 MG tablet Take 2 tablets by mouth at bedtime.    . simvastatin (ZOCOR) 40 MG tablet Take 40 mg by mouth daily.    . Zinc 50 MG CAPS Take 50 mg by mouth daily.     . metoprolol succinate (TOPROL-XL) 25 MG 24 hr tablet Take 1 tablet (25 mg total) by mouth daily.    . Omega-3 Fatty Acids (FISH OIL) 1200 MG CAPS Take 1 capsule by mouth every 30 (thirty) days. Reported on 07/27/2015     No facility-administered medications prior to visit.      Allergies:   Contrast media; Lisinopril; Lipitor; Metformin and related; Iodine; and Shellfish allergy   Social History   Social History  . Marital Status: Widowed    Spouse Name: N/A  . Number of Children: N/A  . Years of Education: N/A   Social History Main Topics  . Smoking status: Never Smoker   . Smokeless tobacco: Never Used  . Alcohol Use: No  . Drug Use: No  . Sexual Activity: Not Asked   Other Topics Concern  . None   Social History Narrative     Family History:  The patient's    family history includes Diabetes in his brother.   ROS:   Please see the history of present illness.    Review of Systems  Constitution: Positive for chills and decreased appetite.   All other systems reviewed and are negative.   Physical Exam:    VS:  BP 126/70 mmHg  Pulse 98  Ht 5\' 10"  (1.778 m)  Wt 165 lb 12.8 oz (75.206 kg)  BMI 23.79 kg/m2  SpO2 99%   Physical Exam  Constitutional: He is oriented to person, place, and time. He appears well-developed and well-nourished. No distress.  HENT:  Head: Normocephalic and atraumatic.  Neck: No JVD present.  Cardiovascular: Normal rate, regular rhythm and normal heart sounds.  Exam reveals no gallop and no friction rub.   No murmur heard. Pulmonary/Chest: Effort normal and breath sounds normal. He has no wheezes. He  has no rales.  Median sternotomy well healed without erythema or d/c  Abdominal: Soft. There is no tenderness.  Musculoskeletal: He exhibits no edema.  Neurological: He is alert and oriented to person, place, and time.  Skin: Skin is warm and dry.  Psychiatric: He has a normal mood and affect.    Wt Readings from Last 3 Encounters:  07/27/15 165 lb 12.8 oz (  75.206 kg)  07/24/15 166 lb 6.4 oz (75.479 kg)  07/14/15 168 lb (76.204 kg)     Studies/Labs Reviewed:     EKG:  EKG is  ordered today.  The ekg ordered today demonstrates NSR, HR 99, normal axis, T-wave inversions in 2, 3, aVF, V1 through V4, QTc 446 ms  Recent Labs: 06/28/2015: ALT 24 07/05/2015: Magnesium 1.9 07/10/2015: BUN 14; Creatinine, Ser 1.31*; Hemoglobin 9.9*; Platelets 274; Potassium 4.1; Sodium 139   Recent Lipid Panel    Component Value Date/Time   CHOL  11/05/2008 0159    91        ATP III CLASSIFICATION:  <200     mg/dL   Desirable  200-239  mg/dL   Borderline High  >=240    mg/dL   High          TRIG 156* 11/05/2008 0159   HDL 32* 11/05/2008 0159   CHOLHDL 2.8 11/05/2008 0159   VLDL 31 11/05/2008 0159   LDLCALC  11/05/2008 0159    28        Total Cholesterol/HDL:CHD Risk Coronary Heart Disease Risk Table                     Men   Women  1/2 Average Risk   3.4   3.3  Average Risk       5.0   4.4  2 X Average Risk   9.6   7.1  3 X Average Risk  23.4   11.0        Use the calculated Patient Ratio above and the CHD Risk Table to determine the patient's CHD Risk.        ATP III CLASSIFICATION (LDL):  <100     mg/dL   Optimal  100-129  mg/dL   Near or Above                    Optimal  130-159  mg/dL   Borderline  160-189  mg/dL   High  >190     mg/dL   Very High    Additional studies/ records that were reviewed today include:   Carotid US 06/28/15 There is no obvious evidence of hemodynamically significant internal carotid artery stenosis bilaterally.  LHC 06/20/15 LAD ostial 80%, mid stent  40% ISR, ostial D1 75%, ostial D2 70% RI ostial 75%, 80% LCx with ostial OM3 90% RCA proximal 95%, mid 60%, distal stent 60% ISR, ostial RPDA stent 50% ISR, 100%, RPLB1 95% EF 45-50%  Difficult procedure from the right radial approach.  Severe proximal to mid RCA stenosis with diffuse disease in RCA beyond the proximal segment.  Eccentric calcified 70-80% proximal LAD. 70% stenosis in a relatively large second diagonal, diffuse disease with 80% stenosis in the mid ramus intermedius, and severe stenosis in the distal obtuse marginal.  Inferoapical hypokinesis. EF greater than 50%. RECOMMENDATIONS:  Consider surgical revascularization. If not a surgical candidate, PCI on the right coronary.  Echo 8/13 EF 60-65%, moderate concentric LVH, mild aortic root dilatation (3.9 cm), trace MR, trace TR, grade 2 diastolic dysfunction  Carotid US 6/13 No hemodynamically significant ICA stenosis  LHC 8/10 LM patent LAD mid stent patent with mid and proximal LAD 50%, D2 jailed by stent with ostial 60% LCx proximal 50% RI 70% at bifurcation RCA mid 50%, distal 40%, patent stent before bifurcation (Cypher), PDA with high-grade 95% proximal focal ISR, 70-80% EF 50% PCI: 8 at 2.5 mm Xience DES to  the PDA   ASSESSMENT:     1. Coronary artery disease involving native coronary artery of native heart without angina pectoris   2. Essential hypertension   3. Hyperlipidemia   4. Ulnar neuritis, right   5. Other fatigue     PLAN:     In order of problems listed above:  1. CAD - He is s/p recent CABG.  He was DC to SNF and is now at home.  He is slowly progressing.  He remains weak and is still working with [facility].  He does have symptoms of ulnar neuritis in his R hand.  His HR is running too fast.  -  Continue ASA 325, statin, beta blocker.   -  Increase Toprol-XL to 25 mg bid   -  Keep FU with Dr. Daneen Sanford in August as planned.   -  Start [place] after HHPT complete  2. HTN - BP controlled.     3. HL - Continue statin. Get FU fasting Lipids, CMET.  4. Ulnar neuritis - I have asked him to use a stress ball with his R hand.  I will review with Dr. Prescott Sanford and decide on +/- referral to OT.  5.  Fatigue - Probably all related to surgery. HR is a little fast s/p CABG.  Will get CBC, CMET today.     Medication Adjustments/Labs and Tests Ordered: Current medicines are reviewed at length with the patient today.  Concerns regarding medicines are outlined above.  Medication changes, Labs and Tests ordered today are outlined in the Patient Instructions noted below. Patient Instructions  Medication Instructions:  1. INCREASE TOPROL XL TO 25 MG TWICE DAILY Labwork: 1. TODAY CMET, LIPID, CBC W/DIFF Testing/Procedures: NONE Follow-Up: KEEP YOUR APPT WITH DR. Tamala Sanford 08/2015 Any Other Special Instructions Will Be Listed Below (If Applicable). YOU ARE BEING REFERRED TO CARDIAC REHAB AT [facility] If you need a refill on your cardiac medications before your next appointment, please call your pharmacy.   Signed, Richardson Dopp, PA-C  07/27/2015 9:29 AM    [facility] [place], [place], [place]  [address] Phone: [phone]; Fax: [phone]

## 2015-07-26 NOTE — Telephone Encounter (Signed)
This encounter was created in error - please disregard.

## 2015-07-27 ENCOUNTER — Ambulatory Visit (INDEPENDENT_AMBULATORY_CARE_PROVIDER_SITE_OTHER): Payer: Commercial Managed Care - HMO | Admitting: Physician Assistant

## 2015-07-27 ENCOUNTER — Encounter: Payer: Self-pay | Admitting: Physician Assistant

## 2015-07-27 VITALS — BP 126/70 | HR 98 | Ht 70.0 in | Wt 165.8 lb

## 2015-07-27 DIAGNOSIS — I2510 Atherosclerotic heart disease of native coronary artery without angina pectoris: Secondary | ICD-10-CM | POA: Diagnosis not present

## 2015-07-27 DIAGNOSIS — G5621 Lesion of ulnar nerve, right upper limb: Secondary | ICD-10-CM

## 2015-07-27 DIAGNOSIS — E785 Hyperlipidemia, unspecified: Secondary | ICD-10-CM

## 2015-07-27 DIAGNOSIS — I10 Essential (primary) hypertension: Secondary | ICD-10-CM | POA: Diagnosis not present

## 2015-07-27 DIAGNOSIS — R5383 Other fatigue: Secondary | ICD-10-CM

## 2015-07-27 LAB — COMPREHENSIVE METABOLIC PANEL
ALBUMIN: 3.5 g/dL — AB (ref 3.6–5.1)
ALK PHOS: 96 U/L (ref 40–115)
ALT: 13 U/L (ref 9–46)
AST: 13 U/L (ref 10–35)
BILIRUBIN TOTAL: 0.4 mg/dL (ref 0.2–1.2)
BUN: 13 mg/dL (ref 7–25)
CALCIUM: 9.5 mg/dL (ref 8.6–10.3)
CO2: 23 mmol/L (ref 20–31)
Chloride: 104 mmol/L (ref 98–110)
Creat: 1.22 mg/dL (ref 0.70–1.25)
Glucose, Bld: 157 mg/dL — ABNORMAL HIGH (ref 65–99)
POTASSIUM: 4.5 mmol/L (ref 3.5–5.3)
Sodium: 140 mmol/L (ref 135–146)
TOTAL PROTEIN: 6.4 g/dL (ref 6.1–8.1)

## 2015-07-27 LAB — CBC WITH DIFFERENTIAL/PLATELET
BASOS ABS: 65 {cells}/uL (ref 0–200)
Basophils Relative: 1 %
EOS PCT: 0 %
Eosinophils Absolute: 0 cells/uL — ABNORMAL LOW (ref 15–500)
HCT: 36.7 % — ABNORMAL LOW (ref 38.5–50.0)
HEMOGLOBIN: 11.7 g/dL — AB (ref 13.2–17.1)
LYMPHS ABS: 2080 {cells}/uL (ref 850–3900)
Lymphocytes Relative: 32 %
MCH: 25.8 pg — AB (ref 27.0–33.0)
MCHC: 31.9 g/dL — AB (ref 32.0–36.0)
MCV: 80.8 fL (ref 80.0–100.0)
MONOS PCT: 10 %
MPV: 8.9 fL (ref 7.5–12.5)
Monocytes Absolute: 650 cells/uL (ref 200–950)
NEUTROS ABS: 3705 {cells}/uL (ref 1500–7800)
NEUTROS PCT: 57 %
PLATELETS: 466 10*3/uL — AB (ref 140–400)
RBC: 4.54 MIL/uL (ref 4.20–5.80)
RDW: 13.8 % (ref 11.0–15.0)
WBC: 6.5 10*3/uL (ref 3.8–10.8)

## 2015-07-27 LAB — LIPID PANEL
CHOLESTEROL: 122 mg/dL — AB (ref 125–200)
HDL: 44 mg/dL (ref >=40)
LDL Cholesterol: 60 mg/dL (ref <130)
TRIGLYCERIDES: 90 mg/dL (ref <150)
Total CHOL/HDL Ratio: 2.8 Ratio (ref <=5.0)
VLDL: 18 mg/dL (ref <30)

## 2015-07-27 MED ORDER — METOPROLOL SUCCINATE ER 25 MG PO TB24: 25.0000 mg | ORAL_TABLET | Freq: Two times a day (BID) | ORAL | Status: DC

## 2015-07-27 NOTE — Patient Instructions (Addendum)
Medication Instructions:  1. INCREASE TOPROL XL TO 25 MG TWICE DAILY Labwork: 1. TODAY CMET, LIPID, CBC W/DIFF Testing/Procedures: NONE Follow-Up: KEEP YOUR APPT WITH DR. Tamala Julian 08/2015 Any Other Special Instructions Will Be Listed Below (If Applicable). YOU ARE BEING REFERRED TO CARDIAC REHAB AT [facility] If you need a refill on your cardiac medications before your next appointment, please call your pharmacy.

## 2015-07-28 ENCOUNTER — Telehealth: Payer: Self-pay | Admitting: *Deleted

## 2015-07-28 DIAGNOSIS — I2510 Atherosclerotic heart disease of native coronary artery without angina pectoris: Secondary | ICD-10-CM | POA: Diagnosis not present

## 2015-07-28 DIAGNOSIS — I130 Hypertensive heart and chronic kidney disease with heart failure and stage 1 through stage 4 chronic kidney disease, or unspecified chronic kidney disease: Secondary | ICD-10-CM | POA: Diagnosis not present

## 2015-07-28 DIAGNOSIS — E119 Type 2 diabetes mellitus without complications: Secondary | ICD-10-CM | POA: Diagnosis not present

## 2015-07-28 DIAGNOSIS — I509 Heart failure, unspecified: Secondary | ICD-10-CM | POA: Diagnosis not present

## 2015-07-28 DIAGNOSIS — Z48812 Encounter for surgical aftercare following surgery on the circulatory system: Secondary | ICD-10-CM | POA: Diagnosis not present

## 2015-07-28 DIAGNOSIS — R2681 Unsteadiness on feet: Secondary | ICD-10-CM | POA: Diagnosis not present

## 2015-07-28 NOTE — Telephone Encounter (Signed)
VM box full on pt's phone #. Lmom on pt's son Patrick's phone DPR on file to have ptcb to go over results.

## 2015-07-29 ENCOUNTER — Telehealth: Payer: Self-pay | Admitting: Physician Assistant

## 2015-07-29 NOTE — Telephone Encounter (Signed)
Paged by answering service, The patient having worsening R hand pain. Reviewed previous note. He was unable to sleep last night due to severe pain. No improvement with Oxycodone and tylenol. Denies swelling or redness however continues to have worsen pain. Advised to go ER for further evaluation as he has failed medical therapy.

## 2015-07-31 DIAGNOSIS — I2510 Atherosclerotic heart disease of native coronary artery without angina pectoris: Secondary | ICD-10-CM | POA: Diagnosis not present

## 2015-07-31 DIAGNOSIS — R2681 Unsteadiness on feet: Secondary | ICD-10-CM | POA: Diagnosis not present

## 2015-07-31 DIAGNOSIS — I130 Hypertensive heart and chronic kidney disease with heart failure and stage 1 through stage 4 chronic kidney disease, or unspecified chronic kidney disease: Secondary | ICD-10-CM | POA: Diagnosis not present

## 2015-07-31 DIAGNOSIS — Z48812 Encounter for surgical aftercare following surgery on the circulatory system: Secondary | ICD-10-CM | POA: Diagnosis not present

## 2015-07-31 DIAGNOSIS — I509 Heart failure, unspecified: Secondary | ICD-10-CM | POA: Diagnosis not present

## 2015-07-31 DIAGNOSIS — E119 Type 2 diabetes mellitus without complications: Secondary | ICD-10-CM | POA: Diagnosis not present

## 2015-07-31 NOTE — Telephone Encounter (Signed)
DPR ok to lmom . Lmom with results on name identified vm for pt. Advised to f/u w/PCP for elevated glucose. Increase dietary protein, gave some food examples . If any questions cb [phone].

## 2015-08-02 DIAGNOSIS — I2510 Atherosclerotic heart disease of native coronary artery without angina pectoris: Secondary | ICD-10-CM | POA: Diagnosis not present

## 2015-08-02 DIAGNOSIS — I509 Heart failure, unspecified: Secondary | ICD-10-CM | POA: Diagnosis not present

## 2015-08-02 DIAGNOSIS — E119 Type 2 diabetes mellitus without complications: Secondary | ICD-10-CM | POA: Diagnosis not present

## 2015-08-02 DIAGNOSIS — R2681 Unsteadiness on feet: Secondary | ICD-10-CM | POA: Diagnosis not present

## 2015-08-02 DIAGNOSIS — Z48812 Encounter for surgical aftercare following surgery on the circulatory system: Secondary | ICD-10-CM | POA: Diagnosis not present

## 2015-08-02 DIAGNOSIS — I130 Hypertensive heart and chronic kidney disease with heart failure and stage 1 through stage 4 chronic kidney disease, or unspecified chronic kidney disease: Secondary | ICD-10-CM | POA: Diagnosis not present

## 2015-08-07 DIAGNOSIS — I2510 Atherosclerotic heart disease of native coronary artery without angina pectoris: Secondary | ICD-10-CM | POA: Diagnosis not present

## 2015-08-07 DIAGNOSIS — I130 Hypertensive heart and chronic kidney disease with heart failure and stage 1 through stage 4 chronic kidney disease, or unspecified chronic kidney disease: Secondary | ICD-10-CM | POA: Diagnosis not present

## 2015-08-07 DIAGNOSIS — R2681 Unsteadiness on feet: Secondary | ICD-10-CM | POA: Diagnosis not present

## 2015-08-07 DIAGNOSIS — E119 Type 2 diabetes mellitus without complications: Secondary | ICD-10-CM | POA: Diagnosis not present

## 2015-08-07 DIAGNOSIS — Z48812 Encounter for surgical aftercare following surgery on the circulatory system: Secondary | ICD-10-CM | POA: Diagnosis not present

## 2015-08-07 DIAGNOSIS — I509 Heart failure, unspecified: Secondary | ICD-10-CM | POA: Diagnosis not present

## 2015-08-09 ENCOUNTER — Ambulatory Visit: Payer: Commercial Managed Care - HMO | Admitting: Cardiothoracic Surgery

## 2015-08-09 DIAGNOSIS — E119 Type 2 diabetes mellitus without complications: Secondary | ICD-10-CM | POA: Diagnosis not present

## 2015-08-09 DIAGNOSIS — I509 Heart failure, unspecified: Secondary | ICD-10-CM | POA: Diagnosis not present

## 2015-08-09 DIAGNOSIS — I2510 Atherosclerotic heart disease of native coronary artery without angina pectoris: Secondary | ICD-10-CM | POA: Diagnosis not present

## 2015-08-09 DIAGNOSIS — R2681 Unsteadiness on feet: Secondary | ICD-10-CM | POA: Diagnosis not present

## 2015-08-09 DIAGNOSIS — I130 Hypertensive heart and chronic kidney disease with heart failure and stage 1 through stage 4 chronic kidney disease, or unspecified chronic kidney disease: Secondary | ICD-10-CM | POA: Diagnosis not present

## 2015-08-09 DIAGNOSIS — Z48812 Encounter for surgical aftercare following surgery on the circulatory system: Secondary | ICD-10-CM | POA: Diagnosis not present

## 2015-08-10 DIAGNOSIS — E7800 Pure hypercholesterolemia, unspecified: Secondary | ICD-10-CM | POA: Diagnosis not present

## 2015-08-11 ENCOUNTER — Other Ambulatory Visit: Payer: Self-pay | Admitting: Cardiothoracic Surgery

## 2015-08-11 DIAGNOSIS — Z48812 Encounter for surgical aftercare following surgery on the circulatory system: Secondary | ICD-10-CM | POA: Diagnosis not present

## 2015-08-11 DIAGNOSIS — I130 Hypertensive heart and chronic kidney disease with heart failure and stage 1 through stage 4 chronic kidney disease, or unspecified chronic kidney disease: Secondary | ICD-10-CM | POA: Diagnosis not present

## 2015-08-11 DIAGNOSIS — R2681 Unsteadiness on feet: Secondary | ICD-10-CM | POA: Diagnosis not present

## 2015-08-11 DIAGNOSIS — I509 Heart failure, unspecified: Secondary | ICD-10-CM | POA: Diagnosis not present

## 2015-08-11 DIAGNOSIS — E119 Type 2 diabetes mellitus without complications: Secondary | ICD-10-CM | POA: Diagnosis not present

## 2015-08-11 DIAGNOSIS — I2510 Atherosclerotic heart disease of native coronary artery without angina pectoris: Secondary | ICD-10-CM | POA: Diagnosis not present

## 2015-08-11 DIAGNOSIS — Z951 Presence of aortocoronary bypass graft: Secondary | ICD-10-CM

## 2015-08-14 ENCOUNTER — Ambulatory Visit (INDEPENDENT_AMBULATORY_CARE_PROVIDER_SITE_OTHER): Payer: Self-pay | Admitting: Physician Assistant

## 2015-08-14 ENCOUNTER — Ambulatory Visit
Admission: RE | Admit: 2015-08-14 | Discharge: 2015-08-14 | Disposition: A | Discharge status: Home / Self Care | Payer: Commercial Managed Care - HMO | Source: Ambulatory Visit | Attending: Cardiothoracic Surgery | Admitting: Cardiothoracic Surgery

## 2015-08-14 VITALS — BP 107/70 | HR 96 | Resp 20 | Ht 71.0 in | Wt 165.0 lb

## 2015-08-14 DIAGNOSIS — S143XXA Injury of brachial plexus, initial encounter: Secondary | ICD-10-CM

## 2015-08-14 DIAGNOSIS — Z951 Presence of aortocoronary bypass graft: Secondary | ICD-10-CM

## 2015-08-14 DIAGNOSIS — M79621 Pain in right upper arm: Secondary | ICD-10-CM | POA: Diagnosis not present

## 2015-08-14 DIAGNOSIS — I2510 Atherosclerotic heart disease of native coronary artery without angina pectoris: Secondary | ICD-10-CM

## 2015-08-14 NOTE — Progress Notes (Addendum)
HPI:  Patient returns for routine postoperative follow-up having undergone a CABG x 4 on 07/04/2015 by Dr. Prescott Gum. The patient's early postoperative recovery while in the hospital was notable for sero sanguinous ooze from proximal sternal incision. He was put on Keflex prior to discharge from [facility]. Since hospital discharge the patient reports he has weakness and numbness in his right 4th,5th fingers and pain under right elbow up into forearm.   Current Outpatient Prescriptions  Medication Sig Dispense Refill  . aspirin EC 325 MG tablet Take 1 tablet (325 mg total) by mouth daily.    . B Complex-C (B-COMPLEX WITH VITAMIN C) tablet Take 1 tablet by mouth daily.    . Calcium Carb-Cholecalciferol (CALCIUM 600 + D PO) Take 600 mg by mouth daily.    . Cholecalciferol (VITAMIN D3 SUPER STRENGTH PO) Take 1 tablet by mouth daily.    . Coenzyme Q10 (COQ10) 50 MG CAPS Take 50 mg by mouth daily.    . Cyanocobalamin (VITAMIN B-12) 5000 MCG TBDP Take 5,000 mcg by mouth daily.    . Folic Acid 0.8 MG CAPS Take 1 capsule by mouth daily.    . Garlic 123XX123 MG CAPS Take 1,000 mg by mouth daily.    . insulin aspart (NOVOLOG FLEXPEN) 100 UNIT/ML FlexPen Inject 10 Units into the skin daily with supper. And pen needles 2/day 15 mL 11  . Insulin Glargine (LANTUS SOLOSTAR) 100 UNIT/ML Solostar Pen Inject 40 Units into the skin every morning. 15 mL 11  . Magnesium 250 MG TABS Take 250 mg by mouth daily.    . metoprolol succinate (TOPROL-XL) 25 MG 24 hr tablet Take 1 tablet (25 mg total) by mouth 2 (two) times daily. 180 tablet 3  . oxyCODONE (OXY IR/ROXICODONE) 5 MG immediate release tablet Take 1 tablet (5 mg total) by mouth every 3 (three) hours as needed for severe pain. 30 tablet 0  . polyethylene glycol (MIRALAX / GLYCOLAX) packet Take 17 g by mouth daily.    . sennosides-docusate sodium (SENOKOT-S) 8.6-50 MG tablet Take 2 tablets by mouth at bedtime.    . simvastatin (ZOCOR) 40 MG tablet Take  40 mg by mouth daily.    . Zinc 50 MG CAPS Take 50 mg by mouth daily.     Vital Signs: BP 107/70, HR 96, RR 20, Oxygen saturation 98% on room air  Physical Exam: CV-RRR Pulmonary-Clear Extremities-No lower extremity edema;eschar right thigh wound. Radial and ulnar pulses intact on right and decreased sensory and motor right 4th and 5 th digits. Wounds-Clean and dry, no sign of infection. NO sternal drainage  Diagnostic Tests: PA/LAT CXR:  There is no edema or consolidation. Heart size and pulmonary vascularity ar normal. No adenopathy. Patient is status post coronary artery bypass grafting surgery. No pneumothorax. No bone lesions.  Impression and Plan: Overall, Mr. Brosz is doing well. He has already seen cardiology in follow up. 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 We will refer him to OT. If there is no improvement, may need to see ortho specialist. He inquired about driving. I instructed him as long as he is not taking any narcotics for pain and the right finger/arm problem does not interfere, he may begin driving short distances (i.e. 30 minutes or less during the day) and gradually increase the frequency and duration as tolerates. We will let him start cardiac rehab once seen and evaluated by OT. He was instructed to continue  with sternal precautions (i.e. No lifting more than 10 pounds for at least 4 more weeks). Finally, his HGA1C was 9.7. He has not followed up with his medical doctor yet. I instructed him to make an appointment within the next several weeks as strict glucose control is very important s/t CABG.He will return to see Dr. Prescott Gum in several weeks after seen by OT.    Nani Skillern, PA-C [facility] [phone]

## 2015-08-15 ENCOUNTER — Ambulatory Visit (INDEPENDENT_AMBULATORY_CARE_PROVIDER_SITE_OTHER): Payer: Commercial Managed Care - HMO | Admitting: Interventional Cardiology

## 2015-08-15 ENCOUNTER — Encounter: Payer: Self-pay | Admitting: Interventional Cardiology

## 2015-08-15 VITALS — BP 122/78 | HR 86 | Ht 70.5 in | Wt 164.8 lb

## 2015-08-15 DIAGNOSIS — Z951 Presence of aortocoronary bypass graft: Secondary | ICD-10-CM | POA: Diagnosis not present

## 2015-08-15 DIAGNOSIS — N5201 Erectile dysfunction due to arterial insufficiency: Secondary | ICD-10-CM | POA: Diagnosis not present

## 2015-08-15 DIAGNOSIS — G5621 Lesion of ulnar nerve, right upper limb: Secondary | ICD-10-CM

## 2015-08-15 DIAGNOSIS — E785 Hyperlipidemia, unspecified: Secondary | ICD-10-CM | POA: Diagnosis not present

## 2015-08-15 DIAGNOSIS — I10 Essential (primary) hypertension: Secondary | ICD-10-CM

## 2015-08-15 NOTE — Patient Instructions (Signed)
Medication Instructions:  None  Labwork: None  Testing/Procedures: None  Follow-Up: Your physician recommends that you schedule a follow-up appointment in: 3 months with Dr. Tamala Julian.   Any Other Special Instructions Will Be Listed Below (If Applicable).  Please see your Primary Care Physician about a referral to Urology for ED.   If you need a refill on your cardiac medications before your next appointment, please call your pharmacy.

## 2015-08-15 NOTE — Progress Notes (Signed)
Cardiology Office Note    Date:  08/15/2015   ID:  [place], Alberding [birth year], MRN [id number]  PCP:  Mayra Neer, MD  Cardiologist: Sinclair Grooms, MD   Chief Complaint  Patient presents with  . Coronary Artery Disease    History of Present Illness:  Joseph Sanford is a 70 y.o. male follow-up of multivessel coronary disease with recent CABG, cerebrovascular disease with prior stroke, chronic kidney disease, chronic diastolic heart failure, hyperlipidemia, and essential hypertension.  He is doing well post-bypass surgery. His major complaint is right arm pain that is neuropathic and in the distribution of all nerve injury. There is no weakness. There is some numbness.  He has not had chest pain. He denies orthopnea and PND.  Past Medical History:  Diagnosis Date  . Chronic kidney disease   . Coronary artery disease   . CVA (cerebral infarction)    08/2011, NO RESIDUAL DEFICITS  . Diabetes mellitus without complication ([facility])    type II  . ED (erectile dysfunction)   . Gout   . History of kidney stones   . Hyperlipidemia   . Hypertension   . Irregular heart beat    "a long time ago"  . Myocardial infarction ([place])    1997    Past Surgical History:  Procedure Laterality Date  . CARDIAC CATHETERIZATION N/A 06/20/2015   Procedure: Left Heart Cath and Coronary Angiography;  Surgeon: Belva Crome, MD;  Location: [facility];  Service: Cardiovascular;  Laterality: N/A;  . CARDIAC CATHETERIZATION  1997   angioplasty  . CARDIAC CATHETERIZATION  2010   stents placed  . CATARACT EXTRACTION W/ INTRAOCULAR LENS  IMPLANT, BILATERAL    . COLONOSCOPY    . CORONARY ARTERY BYPASS GRAFT N/A 07/04/2015   Procedure: CORONARY ARTERY BYPASS GRAFTING (CABG) x 4;  Surgeon: Ivin Poot, MD;  Location: [facility];  Service: Open Heart Surgery;  Laterality: N/A;  . KNEE SURGERY Right    some type of knee cap surgery per pt  . MULTIPLE TOOTH EXTRACTIONS    . TEE WITHOUT CARDIOVERSION  N/A 07/04/2015   Procedure: TRANSESOPHAGEAL ECHOCARDIOGRAM (TEE);  Surgeon: Ivin Poot, MD;  Location: [facility];  Service: Open Heart Surgery;  Laterality: N/A;    Current Medications: Outpatient Medications Prior to Visit  Medication Sig Dispense Refill  . aspirin EC 325 MG tablet Take 1 tablet (325 mg total) by mouth daily.    . B Complex-C (B-COMPLEX WITH VITAMIN C) tablet Take 1 tablet by mouth daily.    . Calcium Carb-Cholecalciferol (CALCIUM 600 + D PO) Take 600 mg by mouth daily.    . Cholecalciferol (VITAMIN D3 SUPER STRENGTH PO) Take 1 tablet by mouth daily.    . Coenzyme Q10 (COQ10) 50 MG CAPS Take 50 mg by mouth daily.    . Cyanocobalamin (VITAMIN B-12) 5000 MCG TBDP Take 5,000 mcg by mouth daily.    . Folic Acid 0.8 MG CAPS Take 1 capsule by mouth daily.    . Garlic 123XX123 MG CAPS Take 1,000 mg by mouth daily.    . Insulin Glargine (LANTUS SOLOSTAR) 100 UNIT/ML Solostar Pen Inject 40 Units into the skin every morning. 15 mL 11  . Magnesium 250 MG TABS Take 250 mg by mouth daily.    . metoprolol succinate (TOPROL-XL) 25 MG 24 hr tablet Take 1 tablet (25 mg total) by mouth 2 (two) times daily. 180 tablet 3  . oxyCODONE (OXY IR/ROXICODONE) 5  MG immediate release tablet Take 1 tablet (5 mg total) by mouth every 3 (three) hours as needed for severe pain. 30 tablet 0  . simvastatin (ZOCOR) 40 MG tablet Take 40 mg by mouth daily.    . Zinc 50 MG CAPS Take 50 mg by mouth daily.     . insulin aspart (NOVOLOG FLEXPEN) 100 UNIT/ML FlexPen Inject 10 Units into the skin daily with supper. And pen needles 2/day (Patient not taking: Reported on 08/15/2015) 15 mL 11  . polyethylene glycol (MIRALAX / GLYCOLAX) packet Take 17 g by mouth daily.    . sennosides-docusate sodium (SENOKOT-S) 8.6-50 MG tablet Take 2 tablets by mouth at bedtime.     No facility-administered medications prior to visit.      Allergies:   Contrast media [iodinated diagnostic agents]; Lisinopril; Lipitor [atorvastatin];  Metformin and related; Iodine; and Shellfish allergy   Social History   Social History  . Marital status: Widowed    Spouse name: N/A  . Number of children: N/A  . Years of education: N/A   Social History Main Topics  . Smoking status: Never Smoker  . Smokeless tobacco: Never Used  . Alcohol use No  . Drug use: No  . Sexual activity: Not Asked   Other Topics Concern  . None   Social History Narrative  . None     Family History:  The patient's family history includes Diabetes in his brother.   ROS:   Please see the history of present illness.    Erectile dysfunction is concerning to the patient. Right arm discomfort was discussed above.  All other systems reviewed and are negative.   PHYSICAL EXAM:   VS:  BP 122/78   Pulse 86   Ht 5' 10.5" (1.791 m)   Wt 164 lb 12.8 oz (74.8 kg)   BMI 23.31 kg/m    GEN: Well nourished, well developed, in no acute distress  HEENT: normal  Neck: no JVD, carotid bruits, or masses Cardiac: RRR; no murmurs, rubs, or gallops,no edema . The sternum is healing well. Respiratory:  clear to auscultation bilaterally, normal work of breathing. GI: soft, nontender, nondistended, + BS MS: no deformity or atrophy  Skin: warm and dry, no rash Neuro:  Alert and Oriented x 3, Strength and sensation are intact Psych: euthymic mood, full affect  Wt Readings from Last 3 Encounters:  08/15/15 164 lb 12.8 oz (74.8 kg)  08/14/15 165 lb (74.8 kg)  07/27/15 165 lb 12.8 oz (75.2 kg)      Studies/Labs Reviewed:   EKG:  EKG  Repeated  Recent Labs: 07/05/2015: Magnesium 1.9 07/27/2015: ALT 13; BUN 13; Creat 1.22; Hemoglobin 11.7; Platelets 466; Potassium 4.5; Sodium 140   Lipid Panel    Component Value Date/Time   CHOL 122 (L) 07/27/2015 0929   TRIG 90 07/27/2015 0929   HDL 44 07/27/2015 0929   CHOLHDL 2.8 07/27/2015 0929   VLDL 18 07/27/2015 0929   LDLCALC 60 07/27/2015 0929    Additional studies/ records that were reviewed today include:    No new data    ASSESSMENT:    1. S/P CABG x 4   2. Essential hypertension   3. Hyperlipidemia   4. Erectile dysfunction due to arterial insufficiency   5. Ulnar neuropathy at elbow of right upper extremity      PLAN:  In order of problems listed above:  1. Nice progress post coronary bypass grafting. We will arrange for phase II cardiac rehabilitation to contact  the patient. I strongly advise his pertussis patient. 2. Low-salt diet is recommended. 3. Continue statin therapy and reduced saturated fat in diet. 4. I recommended that he speak to his primary care physician to get a referral to urology to make sure that they're not structural or hormonal abnormalities. If he needs a PDE 5 inhibitor to help, there will be no contraindication from cardiac viewpoint. 5. Probable stretch injury related to recent bypass surgery.    Medication Adjustments/Labs and Tests Ordered: Current medicines are reviewed at length with the patient today.  Concerns regarding medicines are outlined above.  Medication changes, Labs and Tests ordered today are listed in the Patient Instructions below. Patient Instructions  Medication Instructions:  None  Labwork: None  Testing/Procedures: None  Follow-Up: Your physician recommends that you schedule a follow-up appointment in: 3 months with Dr. Tamala Julian.   Any Other Special Instructions Will Be Listed Below (If Applicable).  Please see your Primary Care Physician about a referral to Urology for ED.   If you need a refill on your cardiac medications before your next appointment, please call your pharmacy.      Signed, Sinclair Grooms, MD  08/15/2015 9:25 AM    [facility] [place], [place], [place]  [address] Phone: [phone]; Fax: [phone]

## 2015-08-17 DIAGNOSIS — Z48812 Encounter for surgical aftercare following surgery on the circulatory system: Secondary | ICD-10-CM | POA: Diagnosis not present

## 2015-08-17 DIAGNOSIS — R2681 Unsteadiness on feet: Secondary | ICD-10-CM | POA: Diagnosis not present

## 2015-08-17 DIAGNOSIS — I2510 Atherosclerotic heart disease of native coronary artery without angina pectoris: Secondary | ICD-10-CM | POA: Diagnosis not present

## 2015-08-17 DIAGNOSIS — I509 Heart failure, unspecified: Secondary | ICD-10-CM | POA: Diagnosis not present

## 2015-08-17 DIAGNOSIS — I130 Hypertensive heart and chronic kidney disease with heart failure and stage 1 through stage 4 chronic kidney disease, or unspecified chronic kidney disease: Secondary | ICD-10-CM | POA: Diagnosis not present

## 2015-08-17 DIAGNOSIS — E119 Type 2 diabetes mellitus without complications: Secondary | ICD-10-CM | POA: Diagnosis not present

## 2015-08-24 DIAGNOSIS — N183 Chronic kidney disease, stage 3 (moderate): Secondary | ICD-10-CM | POA: Diagnosis not present

## 2015-08-24 DIAGNOSIS — G5620 Lesion of ulnar nerve, unspecified upper limb: Secondary | ICD-10-CM | POA: Diagnosis not present

## 2015-08-24 DIAGNOSIS — I1310 Hypertensive heart and chronic kidney disease without heart failure, with stage 1 through stage 4 chronic kidney disease, or unspecified chronic kidney disease: Secondary | ICD-10-CM | POA: Diagnosis not present

## 2015-08-24 DIAGNOSIS — E1121 Type 2 diabetes mellitus with diabetic nephropathy: Secondary | ICD-10-CM | POA: Diagnosis not present

## 2015-08-24 DIAGNOSIS — E782 Mixed hyperlipidemia: Secondary | ICD-10-CM | POA: Diagnosis not present

## 2015-08-24 DIAGNOSIS — I25119 Atherosclerotic heart disease of native coronary artery with unspecified angina pectoris: Secondary | ICD-10-CM | POA: Diagnosis not present

## 2015-09-07 ENCOUNTER — Encounter (HOSPITAL_COMMUNITY)
Admission: RE | Admit: 2015-09-07 | Discharge: 2015-09-07 | Disposition: A | Discharge status: Home / Self Care | Payer: Commercial Managed Care - HMO | Source: Ambulatory Visit | Attending: Interventional Cardiology | Admitting: Interventional Cardiology

## 2015-09-07 VITALS — BP 142/78 | HR 80 | Ht 70.5 in | Wt 170.6 lb

## 2015-09-07 DIAGNOSIS — Z951 Presence of aortocoronary bypass graft: Secondary | ICD-10-CM | POA: Diagnosis not present

## 2015-09-07 NOTE — Progress Notes (Signed)
Cardiac Rehab Medication Review by a Pharmacist  Does the patient  feel that his/her medications are working for him/her?  yes  Has the patient been experiencing any side effects to the medications prescribed?  no  Does the patient measure his/her own blood pressure or blood glucose at home?  yes   Does the patient have any problems obtaining medications due to transportation or finances?   no  Understanding of regimen: excellent Understanding of indications: excellent Potential of compliance: excellent  Pharmacist comments: 70 yo male presents ambulating unassisted for appointment. Patient has excellent understanding of medications. We discussed his vitamins, of which he was worried about taking too many. He wanted to know about the risks and benefits of taking too many or potentially discontinuing some of the supplements he takes. Of note, he asked about discontinuing folic acid which is a prescription medication. I recommended he speak with the prescribing provider before discontinuing. He also reports taking a 50 mg tablet of metoprolol (an older prescription) intermittently instead of the 25 mg twice daily tablets. He has an appointment with cardiology in a few days and I recommended he discuss this with them,  especially as he is not sure if he has refills of either strength remaining. I offered time for questions and discussion and the patient reports no further questions or concerns at this time.   Joseph Sanford, PharmD Pharmacy Resident  Pager [phone] 09/07/15 9:14 AM

## 2015-09-08 ENCOUNTER — Encounter (HOSPITAL_COMMUNITY): Payer: Self-pay

## 2015-09-08 NOTE — Progress Notes (Signed)
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  acetaminophen (TYLENOL) 500 MG tablet, Take 500 mg by mouth daily as needed., Disp: , Rfl:  .  aspirin EC 325 MG tablet, Take 1 tablet (325 mg total) by mouth daily., Disp: , Rfl:  .  B Complex-C (B-COMPLEX WITH VITAMIN C) tablet, Take 1 tablet by mouth daily., Disp: , Rfl:  .  Calcium Carb-Cholecalciferol (CALCIUM 600 + D PO), Take 600 mg by mouth daily., Disp: , Rfl:  .  Cholecalciferol (VITAMIN D3 SUPER STRENGTH PO), Take 1 tablet by mouth daily., Disp: , Rfl:  .  Coenzyme Q10 (COQ10) 50 MG CAPS, Take 50 mg by mouth daily., Disp: , Rfl:  .  Cyanocobalamin (VITAMIN B-12) 5000 MCG TBDP, Take 5,000 mcg by mouth daily., Disp: , Rfl:  .  Folic Acid 0.8 MG CAPS, Take 1 capsule by mouth daily., Disp: , Rfl:  .  Garlic 123XX123 MG CAPS, Take 1,000 mg by mouth daily., Disp: , Rfl:  .  Insulin Glargine (LANTUS SOLOSTAR) 100 UNIT/ML Solostar Pen, Inject 40 Units into the skin every morning., Disp: 15 mL, Rfl: 11 .  Magnesium 250 MG TABS, Take 250 mg by mouth daily., Disp: , Rfl:  .  metoprolol succinate (TOPROL-XL) 25 MG 24 hr tablet, Take 1 tablet (25 mg total) by mouth 2 (two) times daily., Disp: 180 tablet, Rfl: 3 .  simvastatin (ZOCOR) 40 MG tablet, Take 40 mg by mouth daily., Disp: , Rfl:  .  Zinc 50 MG CAPS, Take 50 mg by mouth daily. , Disp: , Rfl:  .  oxyCODONE (OXY IR/ROXICODONE) 5 MG immediate release  tablet, Take 1 tablet (5 mg total) by mouth every 3 (three) hours as needed for severe pain. (Patient not taking: Reported on 09/07/2015), Disp: 30 tablet, Rfl: 0  Past Medical History: Past Medical History:  Diagnosis Date  . Chronic kidney disease   . Coronary artery disease   . CVA (cerebral infarction)    08/2011, NO RESIDUAL DEFICITS  . Diabetes mellitus without complication (HCC)    type II  . ED (erectile dysfunction)   . Gout   . History of kidney stones   . Hyperlipidemia   . Hypertension   . Irregular heart beat    "a long time ago"  . Myocardial infarction ([place])    1997    Tobacco Use: History  Smoking Status  . Never Smoker  Smokeless Tobacco  . Never Used    Labs: Recent Review Flowsheet Data    Labs for ITP Cardiac and Pulmonary Rehab Latest Ref Rng & Units 07/04/2015 07/04/2015 07/04/2015 07/05/2015 07/27/2015   Cholestrol 125 - 200 mg/dL - - - - 122(L)   LDLCALC <130 mg/dL - - - - 60   HDL >=40 mg/dL - - - - 44   Trlycerides <150 mg/dL - - - - 90   Hemoglobin A1c 4.8 - 5.6 % - - - - -  PHART 7.350 - 7.450 7.406 7.377 - - -   PCO2ART 35.0 - 45.0 mmHg 38.8 41.5 - - -   HCO3 20.0 - 24.0 mEq/L 24.5(H) 24.5(H) - - -   TCO2 0 - 100 mmol/L 26 26 26 26  -   ACIDBASEDEF 0.0 - 2.0 mmol/L - 1.0 - - -   O2SAT % 99.0 98.0 - - -      Capillary Blood Glucose: Lab Results  Component Value Date   GLUCAP 111 (H) 07/12/2015   GLUCAP 96 07/12/2015   GLUCAP 120 (H) 07/11/2015   GLUCAP 102 (H) 07/11/2015   GLUCAP 123 (H) 07/11/2015     Exercise Target Goals: Date: 09/07/15  Exercise Program Goal: Individual exercise prescription set with THRR, safety & activity barriers. Participant demonstrates ability to understand and report RPE using BORG scale, to self-measure pulse accurately, and to acknowledge the importance of the exercise prescription.  Exercise Prescription Goal: Starting with aerobic activity 30 plus minutes a day, 3 days per week for initial exercise  prescription. Provide home exercise prescription and guidelines that participant acknowledges understanding prior to discharge.  Activity Barriers & Risk Stratification:     Activity Barriers & Cardiac Risk Stratification - 09/07/15 0846      Activity Barriers & Cardiac Risk Stratification   Activity Barriers Other (comment)   Comments R arm/shoulder nerve pain   Cardiac Risk Stratification High      6 Minute Walk:     6 Minute Walk    Row Name 09/07/15 1406         6 Minute Walk   Phase Initial     Distance 1602 feet     Walk Time 6 minutes     # of Rest Breaks 0     MPH 3.03     METS 3.8     RPE 13     VO2 Peak 13.4     Symptoms No     Resting HR 80 bpm     Resting BP 142/78     Max Ex. HR 102 bpm     Max Ex. BP 138/64     2 Minute Post BP 134/86        Initial Exercise Prescription:     Initial Exercise Prescription - 09/07/15 1400      Date of Initial Exercise RX and Referring Provider   Date 09/07/15   Referring Provider Daneen Schick     Bike   Level 0.8   Minutes 10   METs 2.78     NuStep   Level 3   Minutes 10   METs 2     Track   Laps 8   Minutes 10   METs 2.74     Prescription Details   Frequency (times per week) 3   Duration Progress to 30 minutes of continuous aerobic without signs/symptoms of physical distress     Intensity   THRR 40-80% of Max Heartrate 60-121   Ratings of Perceived Exertion 11-13   Perceived Dyspnea 0-4     Progression   Progression Continue to progress workloads to maintain intensity without signs/symptoms of physical distress.     Resistance Training   Training Prescription Yes   Weight 2lbs   Reps 10-12      Perform Capillary Blood Glucose checks as needed.  Exercise Prescription Changes:   Exercise Comments:   Discharge Exercise Prescription (Final Exercise Prescription Changes):   Nutrition:  Target Goals: Understanding of nutrition guidelines, daily intake  of sodium 1500mg , cholesterol  200mg , calories 30% from fat and 7% or less from saturated fats, daily to have 5 or more servings of fruits and vegetables.  Biometrics:     Pre Biometrics - 09/07/15 1405      Pre Biometrics   Waist Circumference 38.25 inches   Hip Circumference 36 inches   Waist to Hip Ratio 1.06 %   Triceps Skinfold 15 mm   % Body Fat 25.3 %   Grip Strength 29 kg   Flexibility 11 in   Single Leg Stand 21.31 seconds       Nutrition Therapy Plan and Nutrition Goals:   Nutrition Discharge: Nutrition Scores:   Nutrition Goals Re-Evaluation:   Psychosocial: Target Goals: Acknowledge presence or absence of depression, maximize coping skills, provide positive support system. Participant is able to verbalize types and ability to use techniques and skills needed for reducing stress and depression.  Initial Review & Psychosocial Screening:     Initial Psych Review & Screening - 09/08/15 [place]? Yes      Quality of Life Scores:     Quality of Life - 09/07/15 1358      Quality of Life Scores   Health/Function Pre 25.27 %   Socioeconomic Pre 29 %   Psych/Spiritual Pre 28.93 %   Family Pre 28.8 %   GLOBAL Pre 27.26 %      PHQ-9: Recent Review Flowsheet Data    There is no flowsheet data to display.      Psychosocial Evaluation and Intervention:   Psychosocial Re-Evaluation:   Vocational Rehabilitation: Provide vocational rehab assistance to qualifying candidates.   Vocational Rehab Evaluation & Intervention:     Vocational Rehab - 09/08/15 1024      Initial Vocational Rehab Evaluation & Intervention   Assessment shows need for Vocational Rehabilitation No  Pt feels able to return back to his job without any difficulty.        Education: Education Goals: Education classes will be provided on a weekly basis, covering required topics. Participant will state understanding/return demonstration of topics presented.  Learning  Barriers/Preferences:     Learning Barriers/Preferences - 09/07/15 0846      Learning Barriers/Preferences   Learning Barriers Sight   Learning Preferences Written Material      Education Topics: Count Your Pulse:  -Group instruction provided by verbal instruction, demonstration, patient participation and written materials to support subject.  Instructors address importance of being able to find your pulse and how to count your pulse when at home without a heart monitor.  Patients get hands on experience counting their pulse with staff help and individually.   Heart Attack, Angina, and Risk Factor Modification:  -Group instruction provided by verbal instruction, video, and written materials to support subject.  Instructors address signs and symptoms of angina and heart attacks.    Also discuss risk factors for heart disease and how to make changes to improve heart health risk factors.   Functional Fitness:  -Group instruction provided by verbal instruction, demonstration, patient participation, and written materials to support subject.  Instructors address safety measures for doing things around the house.  Discuss how to get up and down off the floor, how to pick things up properly, how to safely get out of a chair without assistance, and balance training.   Meditation and Mindfulness:  -Group instruction provided by verbal instruction, patient participation, and written materials to support subject.  Instructor addresses importance of mindfulness and meditation practice to help reduce stress and improve awareness.  Instructor also leads participants through a meditation exercise.    Stretching for Flexibility and Mobility:  -Group instruction provided by verbal instruction, patient participation, and written materials to support subject.  Instructors lead participants through series of stretches that are designed to increase flexibility thus improving mobility.  These stretches are  additional exercise for major muscle groups that are typically performed during regular warm up and cool down.   Hands Only CPR Anytime:  -Group instruction provided by verbal instruction, video, patient participation and written materials to support subject.  Instructors co-teach with AHA video for hands only CPR.  Participants get hands on experience with mannequins.   Nutrition I class: Heart Healthy Eating:  -Group instruction provided by PowerPoint slides, verbal discussion, and written materials to support subject matter. The instructor gives an explanation and review of the Therapeutic Lifestyle Changes diet recommendations, which includes a discussion on lipid goals, dietary fat, sodium, fiber, plant stanol/sterol esters, sugar, and the components of a well-balanced, healthy diet.   Nutrition II class: Lifestyle Skills:  -Group instruction provided by PowerPoint slides, verbal discussion, and written materials to support subject matter. The instructor gives an explanation and review of label reading, grocery shopping for heart health, heart healthy recipe modifications, and ways to make healthier choices when eating out.   Diabetes Question & Answer:  -Group instruction provided by PowerPoint slides, verbal discussion, and written materials to support subject matter. The instructor gives an explanation and review of diabetes co-morbidities, pre- and post-prandial blood glucose goals, pre-exercise blood glucose goals, signs, symptoms, and treatment of hypoglycemia and hyperglycemia, and foot care basics.   Diabetes Blitz:  -Group instruction provided by PowerPoint slides, verbal discussion, and written materials to support subject matter. The instructor gives an explanation and review of the physiology behind type 1 and type 2 diabetes, diabetes medications and rational behind using different medications, pre- and post-prandial blood glucose recommendations and Hemoglobin A1c goals,  diabetes diet, and exercise including blood glucose guidelines for exercising safely.    Portion Distortion:  -Group instruction provided by PowerPoint slides, verbal discussion, written materials, and food models to support subject matter. The instructor gives an explanation of serving size versus portion size, changes in portions sizes over the last 20 years, and what consists of a serving from each food group.   Stress Management:  -Group instruction provided by verbal instruction, video, and written materials to support subject matter.  Instructors review role of stress in heart disease and how to cope with stress positively.     Exercising on Your Own:  -Group instruction provided by verbal instruction, power point, and written materials to support subject.  Instructors discuss benefits of exercise, components of exercise, frequency and intensity of exercise, and end points for exercise.  Also discuss use of nitroglycerin and activating EMS.  Review options of places to exercise outside of rehab.  Review guidelines for sex with heart disease.   Cardiac Drugs I:  -Group instruction provided by verbal instruction and written materials to support subject.  Instructor reviews cardiac drug classes: antiplatelets, anticoagulants, beta blockers, and statins.  Instructor discusses reasons, side effects, and lifestyle considerations for each drug class.   Cardiac Drugs II:  -Group instruction provided by verbal instruction and written materials to support subject.  Instructor reviews cardiac drug classes: angiotensin converting enzyme inhibitors (ACE-I), angiotensin II receptor blockers (ARBs), nitrates, and calcium channel blockers.  Instructor  discusses reasons, side effects, and lifestyle considerations for each drug class.   Anatomy and Physiology of the Circulatory System:  -Group instruction provided by verbal instruction, video, and written materials to support subject.  Reviews functional  anatomy of heart, how it relates to various diagnoses, and what role the heart plays in the overall system.   Knowledge Questionnaire Score:     Knowledge Questionnaire Score - 09/07/15 1351      Knowledge Questionnaire Score   Pre Score 23/28      Core Components/Risk Factors/Patient Goals at Admission:     Personal Goals and Risk Factors at Admission - 09/07/15 0849      Core Components/Risk Factors/Patient Goals on Admission   Personal Goal Other Yes   Personal Goal short: be able to pick up more than 10lbs, long be able to cut grass and do house work   Intervention Provide exercise programming to assist with improving strength and cardiovascular fitness   Expected Outcomes pt will be able to lift more than 10lbs, cut grass and do house work      Core Components/Risk Factors/Patient Goals Review:    Core Components/Risk Factors/Patient Goals at Discharge (Final Review):    ITP Comments:     ITP Comments    Row Name 09/07/15 0844           ITP Comments Dr. Fransico Him, Medical Director          Comments:  Pt in on 09/07/15 for cardiac rehab orientation. Pt is a prior participant in cardiac rehab.  Pt plans to attend rehab 2 x week due to his co pay.  As a part of orientation, pt completed 6 minute walk test. Pt tolerated well with no complaints of cp or sob.  Monitor showed Sr with no noted ectopy.  Pt is looking forward to participating in cardiac rehab again.  Cherre Huger, BSN

## 2015-09-11 ENCOUNTER — Encounter (HOSPITAL_COMMUNITY): Payer: Self-pay

## 2015-09-11 ENCOUNTER — Encounter (HOSPITAL_COMMUNITY)
Admission: RE | Admit: 2015-09-11 | Discharge: 2015-09-11 | Disposition: A | Discharge status: Home / Self Care | Payer: Commercial Managed Care - HMO | Source: Ambulatory Visit | Attending: Interventional Cardiology | Admitting: Interventional Cardiology

## 2015-09-11 DIAGNOSIS — Z951 Presence of aortocoronary bypass graft: Secondary | ICD-10-CM

## 2015-09-11 LAB — GLUCOSE, CAPILLARY
GLUCOSE-CAPILLARY: 182 mg/dL — AB (ref 65–99)
Glucose-Capillary: 211 mg/dL — ABNORMAL HIGH (ref 65–99)

## 2015-09-11 NOTE — Progress Notes (Signed)
Daily Session Note  Patient Details  Name: Joseph Sanford MRN: [id number] Date of Birth: [birth year] Referring Provider:   Flowsheet Row CARDIAC REHAB PHASE II ORIENTATION from 09/07/2015 in [facility]  Referring Provider  Daneen Schick      Encounter Date: 09/11/2015  Check In:     Session Check In - 09/11/15 0851      Check-In   Location [facility]   Staff Present Su Hilt, MS, ACSM RCEP, Exercise Physiologist;Amber Fair, MS, ACSM RCEP, Exercise Physiologist;Yazhini Mcaulay, RN, Luisa Hart, RN, BSN;Ramon Dredge, RN, MHA;Maria Whitaker, RN, BSN   Supervising physician immediately available to respond to emergencies [facility] immediately available   Physician(s) Dr. Marily Memos   Medication changes reported     No   Fall or balance concerns reported    No   Warm-up and Cool-down Performed as group-led instruction   Resistance Training Performed Yes   VAD Patient? No     Pain Assessment   Currently in Pain? No/denies   Multiple Pain Sites No      Capillary Blood Glucose: Results for orders placed or performed during the hospital encounter of 09/11/15 (from the past 24 hour(s))  Glucose, capillary     Status: Abnormal   Collection Time: 09/11/15  8:16 AM  Result Value Ref Range   Glucose-Capillary 182 (H) 65 - 99 mg/dL     Goals Met:  Exercise tolerated well  Goals Unmet:  Not Applicable  Comments: Pt started cardiac rehab today.  Pt tolerated light exercise without difficulty. VSS, telemetry-sinus rhythm, asymptomatic.  Medication list reconciled. Pt denies barriers to medicaiton compliance.  Although pt did not take his medication this morning prior to rehab.  He reports he awoke late and was hurried to get to class on time. Pt states this is not usual for him and he plans to take medication upon arrival home. Pt instructed of necessity to eat and take medication prior to exercise.   PSYCHOSOCIAL  ASSESSMENT:  PHQ-0.  Pt states he does have situational sadness today related to breaking up with his girlfriend this weekend.  However he demonstrates appropriate coping skills and positive outlook.  No psychosocial needs identified at this time, no psychosocial interventions necessary.    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  Pt goals for cardiac rehab are to gain weight and get back to normal routine. Pt advised of need to attend nutritional education classes as well as exercise classes to be more successful with these goals.  Pt is planning to come to rehab twice weekly due to insurance copay.  He would like to attend Monday and Fridays.  Pt given information about education availabilities on Wednesday at 8:000am. Pt may make plans to attend these sessions.  Pt plans to return back to work on [place]- Thursday.    Pt oriented to exercise equipment and routine.    Understanding verbalized.   Dr. Fransico Him is Medical Director for Cardiac Rehab at [facility].

## 2015-09-12 ENCOUNTER — Ambulatory Visit: Payer: Commercial Managed Care - HMO | Admitting: Endocrinology

## 2015-09-13 ENCOUNTER — Encounter (HOSPITAL_COMMUNITY)
Admission: RE | Admit: 2015-09-13 | Discharge: 2015-09-13 | Disposition: A | Discharge status: Home / Self Care | Payer: Commercial Managed Care - HMO | Source: Ambulatory Visit | Attending: Interventional Cardiology | Admitting: Interventional Cardiology

## 2015-09-15 ENCOUNTER — Encounter (HOSPITAL_COMMUNITY)
Admission: RE | Admit: 2015-09-15 | Discharge: 2015-09-15 | Disposition: A | Discharge status: Home / Self Care | Payer: Commercial Managed Care - HMO | Source: Ambulatory Visit | Attending: Interventional Cardiology | Admitting: Interventional Cardiology

## 2015-09-15 DIAGNOSIS — Z951 Presence of aortocoronary bypass graft: Secondary | ICD-10-CM

## 2015-09-15 LAB — GLUCOSE, CAPILLARY: Glucose-Capillary: 292 mg/dL — ABNORMAL HIGH (ref 65–99)

## 2015-09-20 ENCOUNTER — Encounter (HOSPITAL_COMMUNITY)
Admission: RE | Admit: 2015-09-20 | Discharge: 2015-09-20 | Disposition: A | Discharge status: Home / Self Care | Payer: Commercial Managed Care - HMO | Source: Ambulatory Visit | Attending: Interventional Cardiology | Admitting: Interventional Cardiology

## 2015-09-20 DIAGNOSIS — Z951 Presence of aortocoronary bypass graft: Secondary | ICD-10-CM

## 2015-09-22 ENCOUNTER — Encounter (HOSPITAL_COMMUNITY): Payer: Commercial Managed Care - HMO

## 2015-09-25 ENCOUNTER — Encounter (HOSPITAL_COMMUNITY)
Admission: RE | Admit: 2015-09-25 | Discharge: 2015-09-25 | Disposition: A | Discharge status: Home / Self Care | Payer: Commercial Managed Care - HMO | Source: Ambulatory Visit | Attending: Interventional Cardiology | Admitting: Interventional Cardiology

## 2015-09-25 DIAGNOSIS — Z951 Presence of aortocoronary bypass graft: Secondary | ICD-10-CM

## 2015-09-25 LAB — GLUCOSE, CAPILLARY: Glucose-Capillary: 189 mg/dL — ABNORMAL HIGH (ref 65–99)

## 2015-09-26 NOTE — Progress Notes (Signed)
Cardiac Individual Treatment Plan  Patient Details  Name: Joseph Sanford MRN: [id number] Date of Birth: January 31, 1945 Referring Provider:   Flowsheet Row CARDIAC REHAB PHASE II ORIENTATION from 09/07/2015 in [place]  Referring Provider  Daneen Schick      Initial Encounter Date:  [place] PHASE II ORIENTATION from 09/07/2015 in [place]  Date  09/07/15  Referring Provider  Daneen Schick      Visit Diagnosis: No diagnosis found.  Patient's Home Medications on Admission:  Current Outpatient Prescriptions:  .  acetaminophen (TYLENOL) 500 MG tablet, Take 500 mg by mouth daily as needed., Disp: , Rfl:  .  aspirin EC 325 MG tablet, Take 1 tablet (325 mg total) by mouth daily., Disp: , Rfl:  .  B Complex-C (B-COMPLEX WITH VITAMIN C) tablet, Take 1 tablet by mouth daily., Disp: , Rfl:  .  Calcium Carb-Cholecalciferol (CALCIUM 600 + D PO), Take 600 mg by mouth daily., Disp: , Rfl:  .  Cholecalciferol (VITAMIN D3 SUPER STRENGTH PO), Take 1 tablet by mouth daily., Disp: , Rfl:  .  Coenzyme Q10 (COQ10) 50 MG CAPS, Take 50 mg by mouth daily., Disp: , Rfl:  .  Cyanocobalamin (VITAMIN B-12) 5000 MCG TBDP, Take 5,000 mcg by mouth daily., Disp: , Rfl:  .  Folic Acid 0.8 MG CAPS, Take 1 capsule by mouth daily., Disp: , Rfl:  .  Garlic 123XX123 MG CAPS, Take 1,000 mg by mouth daily., Disp: , Rfl:  .  Insulin Glargine (LANTUS SOLOSTAR) 100 UNIT/ML Solostar Pen, Inject 40 Units into the skin every morning., Disp: 15 mL, Rfl: 11 .  Magnesium 250 MG TABS, Take 250 mg by mouth daily., Disp: , Rfl:  .  metoprolol succinate (TOPROL-XL) 25 MG 24 hr tablet, Take 1 tablet (25 mg total) by mouth 2 (two) times daily., Disp: 180 tablet, Rfl: 3 .  Omega-3 Fatty Acids (FISH OIL) 1200 MG CAPS, Take 1 capsule by mouth daily., Disp: , Rfl:  .  simvastatin (ZOCOR) 40 MG tablet, Take 40 mg by mouth daily., Disp: , Rfl:  .  Zinc 50 MG CAPS, Take  50 mg by mouth daily. , Disp: , Rfl:   Past Medical History: Past Medical History:  Diagnosis Date  . Chronic kidney disease   . Coronary artery disease   . CVA (cerebral infarction)    08/2011, NO RESIDUAL DEFICITS  . Diabetes mellitus without complication (HCC)    type II  . ED (erectile dysfunction)   . Gout   . History of kidney stones   . Hyperlipidemia   . Hypertension   . Irregular heart beat    "a long time ago"  . Myocardial infarction ([place])    1997    Tobacco Use: History  Smoking Status  . Never Smoker  Smokeless Tobacco  . Never Used    Labs: Recent Review Flowsheet Data    Labs for ITP Cardiac and Pulmonary Rehab Latest Ref Rng & Units 07/04/2015 07/04/2015 07/04/2015 07/05/2015 07/27/2015   Cholestrol 125 - 200 mg/dL - - - - 122(L)   LDLCALC <130 mg/dL - - - - 60   HDL >=40 mg/dL - - - - 44   Trlycerides <150 mg/dL - - - - 90   Hemoglobin A1c 4.8 - 5.6 % - - - - -   PHART 7.350 - 7.450 7.406 7.377 - - -   PCO2ART 35.0 - 45.0 mmHg 38.8 41.5 - - -  HCO3 20.0 - 24.0 mEq/L 24.5(H) 24.5(H) - - -   TCO2 0 - 100 mmol/L 26 26 26 26  -   ACIDBASEDEF 0.0 - 2.0 mmol/L - 1.0 - - -   O2SAT % 99.0 98.0 - - -      Capillary Blood Glucose: Lab Results  Component Value Date   GLUCAP 189 (H) 09/25/2015   GLUCAP 292 (H) 09/15/2015   GLUCAP 211 (H) 09/11/2015   GLUCAP 182 (H) 09/11/2015   GLUCAP 111 (H) 07/12/2015     Exercise Target Goals:    Exercise Program Goal: Individual exercise prescription set with THRR, safety & activity barriers. Participant demonstrates ability to understand and report RPE using BORG scale, to self-measure pulse accurately, and to acknowledge the importance of the exercise prescription.  Exercise Prescription Goal: Starting with aerobic activity 30 plus minutes a day, 3 days per week for initial exercise prescription. Provide home exercise prescription and guidelines that participant acknowledges understanding prior to  discharge.  Activity Barriers & Risk Stratification:     Activity Barriers & Cardiac Risk Stratification - 09/07/15 0846      Activity Barriers & Cardiac Risk Stratification   Activity Barriers Other (comment)   Comments R arm/shoulder nerve pain   Cardiac Risk Stratification High      6 Minute Walk:     6 Minute Walk    Row Name 09/07/15 1406         6 Minute Walk   Phase Initial     Distance 1602 feet     Walk Time 6 minutes     # of Rest Breaks 0     MPH 3.03     METS 3.8     RPE 13     VO2 Peak 13.4     Symptoms No     Resting HR 80 bpm     Resting BP 142/78     Max Ex. HR 102 bpm     Max Ex. BP 138/64     2 Minute Post BP 134/86        Initial Exercise Prescription:     Initial Exercise Prescription - 09/07/15 1400      Date of Initial Exercise RX and Referring Provider   Date 09/07/15   Referring Provider Daneen Schick     Bike   Level 0.8   Minutes 10   METs 2.78     NuStep   Level 3   Minutes 10   METs 2     Track   Laps 8   Minutes 10   METs 2.74     Prescription Details   Frequency (times per week) 3   Duration Progress to 30 minutes of continuous aerobic without signs/symptoms of physical distress     Intensity   THRR 40-80% of Max Heartrate 60-121   Ratings of Perceived Exertion 11-13   Perceived Dyspnea 0-4     Progression   Progression Continue to progress workloads to maintain intensity without signs/symptoms of physical distress.     Resistance Training   Training Prescription Yes   Weight 2lbs   Reps 10-12      Perform Capillary Blood Glucose checks as needed.  Exercise Prescription Changes:      Exercise Prescription Changes    Row Name 09/28/15 1100             Exercise Review   Progression Yes         Response to Exercise   Blood  Pressure (Admit) 130/76       Blood Pressure (Exercise) 140/72       Blood Pressure (Exit) 130/80       Heart Rate (Admit) 84 bpm       Heart Rate (Exercise) 107 bpm        Heart Rate (Exit) 84 bpm       Rating of Perceived Exertion (Exercise) 12       Comments Reviewed HEP on 09/25/15       Duration Progress to 30 minutes of continuous aerobic without signs/symptoms of physical distress       Intensity THRR unchanged         Progression   Progression Continue to progress workloads to maintain intensity without signs/symptoms of physical distress.       Average METs 2.2         Resistance Training   Training Prescription Yes       Weight 2lbs       Reps 10-12         Bike   Level 0.9       Minutes 10       METs 2.78         NuStep   Level 3       Minutes 10       METs 2         Track   Laps 11       Minutes 10       METs 2.92         Home Exercise Plan   Plans to continue exercise at Home  Reviewed 09/25/15 See progress note       Frequency Add 2 additional days to program exercise sessions.          Exercise Comments:      Exercise Comments    Row Name 09/28/15 1145           Exercise Comments Reviewed METs and goals. Pt initated HEP this week; pt is tolerating exercise well; will continue to monitor exercise progression          Discharge Exercise Prescription (Final Exercise Prescription Changes):     Exercise Prescription Changes - 09/28/15 1100      Exercise Review   Progression Yes     Response to Exercise   Blood Pressure (Admit) 130/76   Blood Pressure (Exercise) 140/72   Blood Pressure (Exit) 130/80   Heart Rate (Admit) 84 bpm   Heart Rate (Exercise) 107 bpm   Heart Rate (Exit) 84 bpm   Rating of Perceived Exertion (Exercise) 12   Comments Reviewed HEP on 09/25/15   Duration Progress to 30 minutes of continuous aerobic without signs/symptoms of physical distress   Intensity THRR unchanged     Progression   Progression Continue to progress workloads to maintain intensity without signs/symptoms of physical distress.   Average METs 2.2     Resistance Training   Training Prescription Yes   Weight 2lbs    Reps 10-12     Bike   Level 0.9   Minutes 10   METs 2.78     NuStep   Level 3   Minutes 10   METs 2     Track   Laps 11   Minutes 10   METs 2.92     Home Exercise Plan   Plans to continue exercise at Home  Reviewed 09/25/15 See progress note   Frequency Add 2 additional days to program  exercise sessions.      Nutrition:  Target Goals: Understanding of nutrition guidelines, daily intake of sodium 1500mg , cholesterol 200mg , calories 30% from fat and 7% or less from saturated fats, daily to have 5 or more servings of fruits and vegetables.  Biometrics:     Pre Biometrics - 09/07/15 1405      Pre Biometrics   Waist Circumference 38.25 inches   Hip Circumference 36 inches   Waist to Hip Ratio 1.06 %   Triceps Skinfold 15 mm   % Body Fat 25.3 %   Grip Strength 29 kg   Flexibility 11 in   Single Leg Stand 21.31 seconds       Nutrition Therapy Plan and Nutrition Goals:     Nutrition Therapy & Goals - 09/13/15 1023      Nutrition Therapy   Diet Carb Modified, Therapeutic Lifestyle Changes     Personal Nutrition Goals   Personal Goal #1 Improved glycemic control as evidenced by a decrease in A1c from 9.7 toward less than 7.0     Intervention Plan   Intervention Prescribe, educate and counsel regarding individualized specific dietary modifications aiming towards targeted core components such as weight, hypertension, lipid management, diabetes, heart failure and other comorbidities.   Expected Outcomes Short Term Goal: Understand basic principles of dietary content, such as calories, fat, sodium, cholesterol and nutrients.;Long Term Goal: Adherence to prescribed nutrition plan.      Nutrition Discharge: Nutrition Scores:     Nutrition Assessments - 09/29/15 0926      MEDFICTS Scores   Pre Score 35      Nutrition Goals Re-Evaluation:   Psychosocial: Target Goals: Acknowledge presence or absence of depression, maximize coping skills, provide positive  support system. Participant is able to verbalize types and ability to use techniques and skills needed for reducing stress and depression.  Initial Review & Psychosocial Screening:     Initial Psych Review & Screening - 09/08/15 [place]? Yes      Quality of Life Scores:     Quality of Life - 09/07/15 1358      Quality of Life Scores   Health/Function Pre 25.27 %   Socioeconomic Pre 29 %   Psych/Spiritual Pre 28.93 %   Family Pre 28.8 %   GLOBAL Pre 27.26 %      PHQ-9: Recent Review Flowsheet Data    Depression screen [facility] 2/9 09/11/2015   Decreased Interest 0   Down, Depressed, Hopeless 0   PHQ - 2 Score 0      Psychosocial Evaluation and Intervention:     Psychosocial Evaluation - 09/26/15 1710      Psychosocial Evaluation & Interventions   Interventions Relaxation education;Encouraged to exercise with the program and follow exercise prescription   Comments no psychosocial needs identified, no interventions necessary. pt expresses his biggest concern is continued numbness and weakness in right hand. pt encouraged to continue OT home exercises to increase strength and function.  pt is also eager to return to work.   Continued Psychosocial Services Needed No      Psychosocial Re-Evaluation:   Vocational Rehabilitation: Provide vocational rehab assistance to qualifying candidates.   Vocational Rehab Evaluation & Intervention:     Vocational Rehab - 09/08/15 1024      Initial Vocational Rehab Evaluation & Intervention   Assessment shows need for Vocational Rehabilitation No  Pt feels able to return back to his job without  any difficulty.        Education: Education Goals: Education classes will be provided on a weekly basis, covering required topics. Participant will state understanding/return demonstration of topics presented.  Learning Barriers/Preferences:     Learning Barriers/Preferences - 09/07/15 0846       Learning Barriers/Preferences   Learning Barriers Sight   Learning Preferences Written Material      Education Topics: Count Your Pulse:  -Group instruction provided by verbal instruction, demonstration, patient participation and written materials to support subject.  Instructors address importance of being able to find your pulse and how to count your pulse when at home without a heart monitor.  Patients get hands on experience counting their pulse with staff help and individually.   Heart Attack, Angina, and Risk Factor Modification:  -Group instruction provided by verbal instruction, video, and written materials to support subject.  Instructors address signs and symptoms of angina and heart attacks.    Also discuss risk factors for heart disease and how to make changes to improve heart health risk factors.   Functional Fitness:  -Group instruction provided by verbal instruction, demonstration, patient participation, and written materials to support subject.  Instructors address safety measures for doing things around the house.  Discuss how to get up and down off the floor, how to pick things up properly, how to safely get out of a chair without assistance, and balance training.   Meditation and Mindfulness:  -Group instruction provided by verbal instruction, patient participation, and written materials to support subject.  Instructor addresses importance of mindfulness and meditation practice to help reduce stress and improve awareness.  Instructor also leads participants through a meditation exercise.    Stretching for Flexibility and Mobility:  -Group instruction provided by verbal instruction, patient participation, and written materials to support subject.  Instructors lead participants through series of stretches that are designed to increase flexibility thus improving mobility.  These stretches are additional exercise for major muscle groups that are typically performed during  regular warm up and cool down.   Hands Only CPR Anytime:  -Group instruction provided by verbal instruction, video, patient participation and written materials to support subject.  Instructors co-teach with AHA video for hands only CPR.  Participants get hands on experience with mannequins.   Nutrition I class: Heart Healthy Eating:  -Group instruction provided by PowerPoint slides, verbal discussion, and written materials to support subject matter. The instructor gives an explanation and review of the Therapeutic Lifestyle Changes diet recommendations, which includes a discussion on lipid goals, dietary fat, sodium, fiber, plant stanol/sterol esters, sugar, and the components of a well-balanced, healthy diet.   Nutrition II class: Lifestyle Skills:  -Group instruction provided by PowerPoint slides, verbal discussion, and written materials to support subject matter. The instructor gives an explanation and review of label reading, grocery shopping for heart health, heart healthy recipe modifications, and ways to make healthier choices when eating out.   Diabetes Question & Answer:  -Group instruction provided by PowerPoint slides, verbal discussion, and written materials to support subject matter. The instructor gives an explanation and review of diabetes co-morbidities, pre- and post-prandial blood glucose goals, pre-exercise blood glucose goals, signs, symptoms, and treatment of hypoglycemia and hyperglycemia, and foot care basics.   Diabetes Blitz:  -Group instruction provided by PowerPoint slides, verbal discussion, and written materials to support subject matter. The instructor gives an explanation and review of the physiology behind type 1 and type 2 diabetes, diabetes medications and rational behind using different  medications, pre- and post-prandial blood glucose recommendations and Hemoglobin A1c goals, diabetes diet, and exercise including blood glucose guidelines for exercising safely.     Portion Distortion:  -Group instruction provided by PowerPoint slides, verbal discussion, written materials, and food models to support subject matter. The instructor gives an explanation of serving size versus portion size, changes in portions sizes over the last 20 years, and what consists of a serving from each food group.   Stress Management:  -Group instruction provided by verbal instruction, video, and written materials to support subject matter.  Instructors review role of stress in heart disease and how to cope with stress positively.     Exercising on Your Own:  -Group instruction provided by verbal instruction, power point, and written materials to support subject.  Instructors discuss benefits of exercise, components of exercise, frequency and intensity of exercise, and end points for exercise.  Also discuss use of nitroglycerin and activating EMS.  Review options of places to exercise outside of rehab.  Review guidelines for sex with heart disease.   Cardiac Drugs I:  -Group instruction provided by verbal instruction and written materials to support subject.  Instructor reviews cardiac drug classes: antiplatelets, anticoagulants, beta blockers, and statins.  Instructor discusses reasons, side effects, and lifestyle considerations for each drug class.   Cardiac Drugs II:  -Group instruction provided by verbal instruction and written materials to support subject.  Instructor reviews cardiac drug classes: angiotensin converting enzyme inhibitors (ACE-I), angiotensin II receptor blockers (ARBs), nitrates, and calcium channel blockers.  Instructor discusses reasons, side effects, and lifestyle considerations for each drug class.   Anatomy and Physiology of the Circulatory System:  -Group instruction provided by verbal instruction, video, and written materials to support subject.  Reviews functional anatomy of heart, how it relates to various diagnoses, and what role the heart plays  in the overall system. Flowsheet Row CARDIAC REHAB PHASE II EXERCISE from 09/20/2015 in [place]  Date  09/20/15  Instruction Review Code  2- meets goals/outcomes      Knowledge Questionnaire Score:     Knowledge Questionnaire Score - 09/13/15 1024      Knowledge Questionnaire Score   Pre Score 23/28     DM 14/15      Core Components/Risk Factors/Patient Goals at Admission:     Personal Goals and Risk Factors at Admission - 09/07/15 0849      Core Components/Risk Factors/Patient Goals on Admission   Personal Goal Other Yes   Personal Goal short: be able to pick up more than 10lbs, long be able to cut grass and do house work   Intervention Provide exercise programming to assist with improving strength and cardiovascular fitness   Expected Outcomes pt will be able to lift more than 10lbs, cut grass and do house work      Core Components/Risk Factors/Patient Goals Review:      Goals and Risk Factor Review    Row Name 09/28/15 1146 09/29/15 1110           Core Components/Risk Factors/Patient Goals Review   Personal Goals Review Other;Increase Strength and Stamina  -      Review Initated HEP and pt will walk at home and go to gym 2x/week Pt returns to work today and feels as though he is getting stronger      Expected Outcomes Pt will increase energy levels and exercise capacity Pt will increase energy levels and exercise capacity  Core Components/Risk Factors/Patient Goals at Discharge (Final Review):      Goals and Risk Factor Review - 09/29/15 1110      Core Components/Risk Factors/Patient Goals Review   Review Pt returns to work today and feels as though he is getting stronger   Expected Outcomes Pt will increase energy levels and exercise capacity      ITP Comments:     ITP Comments    Row Name 09/07/15 0844           ITP Comments Dr. Fransico Him, Medical Director          Comments:  Pt is making expected  progress toward personal goals after completing 5 sessions. Recommend continued exercise and life style modification education including  stress management and relaxation techniques to decrease cardiac risk profile.

## 2015-09-27 ENCOUNTER — Encounter: Payer: Self-pay | Admitting: Cardiothoracic Surgery

## 2015-09-27 ENCOUNTER — Encounter (HOSPITAL_COMMUNITY)
Admission: RE | Admit: 2015-09-27 | Discharge: 2015-09-27 | Disposition: A | Discharge status: Home / Self Care | Payer: Commercial Managed Care - HMO | Source: Ambulatory Visit | Attending: Interventional Cardiology | Admitting: Interventional Cardiology

## 2015-09-27 ENCOUNTER — Ambulatory Visit (INDEPENDENT_AMBULATORY_CARE_PROVIDER_SITE_OTHER): Payer: Self-pay | Admitting: Cardiothoracic Surgery

## 2015-09-27 VITALS — BP 132/84 | HR 85 | Resp 16 | Ht 70.5 in | Wt 174.0 lb

## 2015-09-27 DIAGNOSIS — Z951 Presence of aortocoronary bypass graft: Secondary | ICD-10-CM

## 2015-09-27 DIAGNOSIS — I2510 Atherosclerotic heart disease of native coronary artery without angina pectoris: Secondary | ICD-10-CM

## 2015-09-27 NOTE — Progress Notes (Signed)
PCP is Mayra Neer, MD Referring Provider is Belva Crome, MD  Chief Complaint  Patient presents with  . 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 He is cleared to drive and return to work.  Patient has had no recurrent angina since surgery and surgical incisions are well-healed. Chest x-ray is clear.  His main complaint is residual but improving right fourth and fifth finger numbness and some weakness in the right hand. No radiation from the neck. 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 He will be referred to shoulder-hand clinic for evaluation and therapy.  Past Medical History:  Diagnosis Date  . Chronic kidney disease   . Coronary artery disease   . CVA (cerebral infarction)    08/2011, NO RESIDUAL DEFICITS  . Diabetes mellitus without complication (HCC)    type II  . ED (erectile dysfunction)   . Gout   . History of kidney stones   . Hyperlipidemia   . Hypertension   . Irregular heart beat    "a long time ago"  . Myocardial infarction ([place])    1997    Past Surgical History:  Procedure Laterality Date  . CARDIAC CATHETERIZATION N/A 06/20/2015   Procedure: Left Heart Cath and Coronary Angiography;  Surgeon: Belva Crome, MD;  Location: [place] [facility];  Service: Cardiovascular;  Laterality: N/A;  . CARDIAC CATHETERIZATION  1997   angioplasty  . CARDIAC CATHETERIZATION  2010   stents placed  . CATARACT EXTRACTION W/ INTRAOCULAR LENS  IMPLANT, BILATERAL    . COLONOSCOPY    . CORONARY ARTERY BYPASS GRAFT N/A 07/04/2015   Procedure: CORONARY ARTERY BYPASS GRAFTING (CABG) x 4;  Surgeon: Ivin Poot, MD;  Location: [place];  Service: Open Heart Surgery;  Laterality: N/A;  . KNEE  SURGERY Right    some type of knee cap surgery per pt  . MULTIPLE TOOTH EXTRACTIONS    . TEE WITHOUT CARDIOVERSION N/A 07/04/2015   Procedure: TRANSESOPHAGEAL ECHOCARDIOGRAM (TEE);  Surgeon: Ivin Poot, MD;  Location: [place];  Service: Open Heart Surgery;  Laterality: N/A;    Family History  Problem Relation Age of Onset  . Diabetes Brother     Social History Social History  Substance Use Topics  . Smoking status: Never Smoker  . Smokeless tobacco: Never Used  . Alcohol use No    Current Outpatient Prescriptions  Medication Sig Dispense Refill  . aspirin EC 325 MG tablet Take 1 tablet (325 mg total) by mouth daily.    . B Complex-C (B-COMPLEX WITH VITAMIN C) tablet Take 1 tablet by mouth daily.    . Calcium Carb-Cholecalciferol (CALCIUM 600 + D PO) Take 600 mg by mouth daily.    . Cholecalciferol (VITAMIN D3 SUPER STRENGTH PO) Take 1 tablet by mouth daily.    . Coenzyme Q10 (COQ10) 50 MG CAPS Take 50 mg by mouth daily.    . Cyanocobalamin (VITAMIN B-12) 5000 MCG TBDP Take 5,000 mcg by mouth daily.    . Folic Acid 0.8 MG CAPS Take 1 capsule by mouth daily.    . Garlic 123XX123 MG CAPS Take 1,000 mg by mouth daily.    . Insulin Glargine (LANTUS SOLOSTAR)  100 UNIT/ML Solostar Pen Inject 40 Units into the skin every morning. 15 mL 11  . Magnesium 250 MG TABS Take 250 mg by mouth daily.    . metoprolol succinate (TOPROL-XL) 25 MG 24 hr tablet Take 1 tablet (25 mg total) by mouth 2 (two) times daily. 180 tablet 3  . Omega-3 Fatty Acids (FISH OIL) 1200 MG CAPS Take 1 capsule by mouth daily.    . simvastatin (ZOCOR) 40 MG tablet Take 40 mg by mouth daily.    . Zinc 50 MG CAPS Take 50 mg by mouth daily.     Marland Kitchen acetaminophen (TYLENOL) 500 MG tablet Take 500 mg by mouth daily as needed.     No current facility-administered medications for this visit.     Allergies  Allergen Reactions  . Contrast Media [Iodinated Diagnostic Agents] Other (See Comments)    "bumps on arm"  . Lisinopril  Other (See Comments)    cough  . Lipitor [Atorvastatin] Other (See Comments)    Brain fog  . Metformin And Related Other (See Comments)    "STOMACH UPSET"  . Iodine Nausea And Vomiting  . Shellfish Allergy Nausea And Vomiting    Reaction to shrimp    Review of Systems  Improved appetite and strength Weight is stable No fever No angina or symptoms of CHF  BP 132/84   Pulse 85   Resp 16   Ht 5' 10.5" (1.791 m)   Wt 174 lb (78.9 kg)   SpO2 98% Comment: RA  BMI 24.61 kg/m  Physical Exam      Exam    General- alert and comfortable   Lungs- clear without rales, wheezes   Cor- regular rate and rhythm, no murmur , gallop   Abdomen- soft, non-tender   Extremities - warm, non-tender, minimal edema   Neuro- oriented, appropriate, no focal weakness   Diagnostic Tests: None today  Impression: Almost full recovery after multivessel CABG Patient may return to work and drive He'll be referred for evaluation of his right hand numbness and weakness from 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  Plan: Return as needed. Importance of heart healthy lifestyle emphasized to the patient including medication compliance, diet, and activity.   Len Childs, MD [facility] [phone]

## 2015-09-29 ENCOUNTER — Encounter (HOSPITAL_COMMUNITY)
Admission: RE | Admit: 2015-09-29 | Discharge: 2015-09-29 | Disposition: A | Discharge status: Home / Self Care | Payer: Commercial Managed Care - HMO | Source: Ambulatory Visit | Attending: Interventional Cardiology | Admitting: Interventional Cardiology

## 2015-09-29 DIAGNOSIS — Z951 Presence of aortocoronary bypass graft: Secondary | ICD-10-CM | POA: Diagnosis not present

## 2015-09-29 NOTE — Progress Notes (Signed)
Joseph Sanford 70 y.o. male Nutrition Note Spoke with pt. Pt is at a normal wt for his ht. Pt reports his UBW ~180 lb and he would like to gain wt back to his UBW. Pt wt is up 10 lb over the past 6 weeks. Nutrition Plan and Nutrition Survey goals reviewed with pt. Pt is following Step 2 of the Therapeutic Lifestyle Changes diet. Pt is diabetic. Last A1c indicates blood glucose poorly controlled. Per discussion, pt states his A1c is elevated because "I couldn't afford my insulin; it was $300/month." Pt reports cost of medication and the use of insulin pens has helped him take his medication. Per pt, "I still forget sometimes." Pt states he forgets to take his insulin ~2 times/week. This writer helped set up pt phone alarm to remind pt to take his insulin. Diabetes Education test results reviewed with pt. Pt checks CBG's once daily. Fasting CBG's reportedly ~174 mg/dL. Pt has an appt with Dr. Loanne Drilling to recheck his A1c at the end of September. 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 Pt expressed understanding of the information reviewed. Pt aware of nutrition education classes offered.  Lab Results  Component Value Date   HGBA1C 9.7 (H) 06/28/2015   Wt Readings from Last 3 Encounters:  09/27/15 174 lb (78.9 kg)  09/07/15 170 lb 10.2 oz (77.4 kg)  08/15/15 164 lb 12.8 oz (74.8 kg)    Nutrition Diagnosis ? Food-and nutrition-related knowledge deficit related to lack of exposure to information as related to diagnosis of: ? CVD ? DM  Nutrition Intervention ? Pt's individual nutrition plan reviewed with pt. ? Benefits of adopting Therapeutic Lifestyle Changes discussed when Medficts reviewed. ? Pt to attend the Portion Distortion class ? Pt to attend the Diabetes Q & A class ? Pt to attend the   ? Nutrition I class                  ? Nutrition II class     ? Diabetes Blitz class ? Pt given handouts for: ? Nutrition I class ? Nutrition II class ?  Diabetes Blitz Class  ? Continue client-centered nutrition education by RD, as part of interdisciplinary care. Goal(s) ? CBG concentrations in the normal range or as close to normal as is safely possible. ? Pt to gain wt to UBW of 180 lb Monitor and Evaluate progress toward nutrition goal with team. Derek Mound, M.Ed, RD, LDN, CDE 09/29/2015 9:27 AM

## 2015-10-02 ENCOUNTER — Encounter (HOSPITAL_COMMUNITY)
Admission: RE | Admit: 2015-10-02 | Discharge: 2015-10-02 | Disposition: A | Discharge status: Home / Self Care | Payer: Commercial Managed Care - HMO | Source: Ambulatory Visit | Attending: Interventional Cardiology | Admitting: Interventional Cardiology

## 2015-10-02 ENCOUNTER — Encounter (HOSPITAL_COMMUNITY): Payer: Commercial Managed Care - HMO

## 2015-10-02 DIAGNOSIS — Z951 Presence of aortocoronary bypass graft: Secondary | ICD-10-CM | POA: Diagnosis not present

## 2015-10-02 LAB — GLUCOSE, CAPILLARY: GLUCOSE-CAPILLARY: 81 mg/dL (ref 65–99)

## 2015-10-02 NOTE — Progress Notes (Signed)
Pt preexercise CBG-81. Pt did not exercise per protocol. Pt states he had eaten chicken for breakfast. Pt given lemonade and graham crackers. Pt advised to add carbohydrate to morning meal.  Pt verbalized understanding.

## 2015-10-04 ENCOUNTER — Encounter (HOSPITAL_COMMUNITY): Payer: Commercial Managed Care - HMO

## 2015-10-06 ENCOUNTER — Encounter (HOSPITAL_COMMUNITY): Payer: Commercial Managed Care - HMO

## 2015-10-06 ENCOUNTER — Encounter (HOSPITAL_COMMUNITY)
Admission: RE | Admit: 2015-10-06 | Discharge: 2015-10-06 | Disposition: A | Discharge status: Home / Self Care | Payer: Commercial Managed Care - HMO | Source: Ambulatory Visit | Attending: Interventional Cardiology | Admitting: Interventional Cardiology

## 2015-10-06 DIAGNOSIS — Z951 Presence of aortocoronary bypass graft: Secondary | ICD-10-CM

## 2015-10-06 LAB — GLUCOSE, CAPILLARY: GLUCOSE-CAPILLARY: 171 mg/dL — AB (ref 65–99)

## 2015-10-09 ENCOUNTER — Encounter (HOSPITAL_COMMUNITY): Payer: Commercial Managed Care - HMO

## 2015-10-09 ENCOUNTER — Encounter (HOSPITAL_COMMUNITY)
Admission: RE | Admit: 2015-10-09 | Discharge: 2015-10-09 | Disposition: A | Discharge status: Home / Self Care | Payer: Commercial Managed Care - HMO | Source: Ambulatory Visit | Attending: Interventional Cardiology | Admitting: Interventional Cardiology

## 2015-10-09 DIAGNOSIS — Z951 Presence of aortocoronary bypass graft: Secondary | ICD-10-CM

## 2015-10-09 LAB — GLUCOSE, CAPILLARY: GLUCOSE-CAPILLARY: 160 mg/dL — AB (ref 65–99)

## 2015-10-10 ENCOUNTER — Encounter: Payer: Self-pay | Admitting: Endocrinology

## 2015-10-10 ENCOUNTER — Ambulatory Visit (INDEPENDENT_AMBULATORY_CARE_PROVIDER_SITE_OTHER): Payer: Commercial Managed Care - HMO | Admitting: Endocrinology

## 2015-10-10 VITALS — BP 132/86 | HR 71 | Ht 70.5 in | Wt 177.0 lb

## 2015-10-10 DIAGNOSIS — N183 Chronic kidney disease, stage 3 unspecified: Secondary | ICD-10-CM

## 2015-10-10 DIAGNOSIS — Z794 Long term (current) use of insulin: Secondary | ICD-10-CM | POA: Diagnosis not present

## 2015-10-10 DIAGNOSIS — E1122 Type 2 diabetes mellitus with diabetic chronic kidney disease: Secondary | ICD-10-CM

## 2015-10-10 LAB — POCT GLYCOSYLATED HEMOGLOBIN (HGB A1C): Hemoglobin A1C: 6.9

## 2015-10-10 MED ORDER — INSULIN GLARGINE 100 UNIT/ML SOLOSTAR PEN: 30.0000 [IU] | PEN_INJECTOR | SUBCUTANEOUS | 11 refills | Status: DC

## 2015-10-10 MED ORDER — INSULIN ASPART 100 UNIT/ML FLEXPEN: 10.0000 [IU] | PEN_INJECTOR | Freq: Every day | SUBCUTANEOUS | 11 refills | Status: DC

## 2015-10-10 NOTE — Progress Notes (Signed)
Subjective:    Patient ID: Joseph Sanford, male    DOB: Jun 25, 1945, 70 y.o.   MRN: [id number]  HPI Pt returns for f/u of diabetes mellitus: DM type: Insulin-requiring type 2 Dx'ed: 0000000 Complications: polyneuropathy, CAD, retinopathy, TIA, and renal insufficiency Therapy: insulin since 2000 DKA: never Severe hypoglycemia: never Pancreatitis: never.  Other: he stopped using V-GO, due to cost; he stopped metformin, due to diarrhea; he takes BID insulin, after poor results with multiple daily injections.   Interval history: He skips the insulin approx twice per week, due to mild fasting hypoglycemia.  He has lost more weight.  He takes lantus 40/d, and humalog 10/d (supper).  Meter is downloaded today, and the printout is scanned into the record  It varies from 60-180.  Almost all are checked fasting.  Past Medical History:  Diagnosis Date  . Chronic kidney disease   . Coronary artery disease   . CVA (cerebral infarction)    08/2011, NO RESIDUAL DEFICITS  . Diabetes mellitus without complication (HCC)    type II  . ED (erectile dysfunction)   . Gout   . History of kidney stones   . Hyperlipidemia   . Hypertension   . Irregular heart beat    "a long time ago"  . Myocardial infarction ([place])    1997    Past Surgical History:  Procedure Laterality Date  . CARDIAC CATHETERIZATION N/A 06/20/2015   Procedure: Left Heart Cath and Coronary Angiography;  Surgeon: Belva Crome, MD;  Location: [place] [facility];  Service: Cardiovascular;  Laterality: N/A;  . CARDIAC CATHETERIZATION  1997   angioplasty  . CARDIAC CATHETERIZATION  2010   stents placed  . CATARACT EXTRACTION W/ INTRAOCULAR LENS  IMPLANT, BILATERAL    . COLONOSCOPY    . CORONARY ARTERY BYPASS GRAFT N/A 07/04/2015   Procedure: CORONARY ARTERY BYPASS GRAFTING (CABG) x 4;  Surgeon: Ivin Poot, MD;  Location: [place];  Service: Open Heart Surgery;  Laterality: N/A;  . KNEE SURGERY Right    some type of knee cap surgery per  pt  . MULTIPLE TOOTH EXTRACTIONS    . TEE WITHOUT CARDIOVERSION N/A 07/04/2015   Procedure: TRANSESOPHAGEAL ECHOCARDIOGRAM (TEE);  Surgeon: Ivin Poot, MD;  Location: [place];  Service: Open Heart Surgery;  Laterality: N/A;    Social History   Social History  . Marital status: Widowed    Spouse name: N/A  . Number of children: N/A  . Years of education: N/A   Occupational History  . Not on file.   Social History Main Topics  . Smoking status: Never Smoker  . Smokeless tobacco: Never Used  . Alcohol use No  . Drug use: No  . Sexual activity: Not on file   Other Topics Concern  . Not on file   Social History Narrative  . No narrative on file    Current Outpatient Prescriptions on File Prior to Visit  Medication Sig Dispense Refill  . acetaminophen (TYLENOL) 500 MG tablet Take 500 mg by mouth daily as needed.    Marland Kitchen aspirin EC 325 MG tablet Take 1 tablet (325 mg total) by mouth daily.    . B Complex-C (B-COMPLEX WITH VITAMIN C) tablet Take 1 tablet by mouth daily.    . Calcium Carb-Cholecalciferol (CALCIUM 600 + D PO) Take 600 mg by mouth daily.    . Cholecalciferol (VITAMIN D3 SUPER STRENGTH PO) Take 1 tablet by mouth daily.    . Coenzyme Q10 (  COQ10) 50 MG CAPS Take 50 mg by mouth daily.    . Cyanocobalamin (VITAMIN B-12) 5000 MCG TBDP Take 5,000 mcg by mouth daily.    . Folic Acid 0.8 MG CAPS Take 1 capsule by mouth daily.    . Garlic 123XX123 MG CAPS Take 1,000 mg by mouth daily.    . Magnesium 250 MG TABS Take 250 mg by mouth daily.    . metoprolol succinate (TOPROL-XL) 25 MG 24 hr tablet Take 1 tablet (25 mg total) by mouth 2 (two) times daily. 180 tablet 3  . Omega-3 Fatty Acids (FISH OIL) 1200 MG CAPS Take 1 capsule by mouth daily.    . simvastatin (ZOCOR) 40 MG tablet Take 40 mg by mouth daily.    . Zinc 50 MG CAPS Take 50 mg by mouth daily.      No current facility-administered medications on file prior to visit.     Allergies  Allergen Reactions  . Contrast  Media [Iodinated Diagnostic Agents] Other (See Comments)    "bumps on arm"  . Lisinopril Other (See Comments)    cough  . Lipitor [Atorvastatin] Other (See Comments)    Brain fog  . Metformin And Related Other (See Comments)    "STOMACH UPSET"  . Iodine Nausea And Vomiting  . Shellfish Allergy Nausea And Vomiting    Reaction to shrimp    Family History  Problem Relation Age of Onset  . Diabetes Brother    BP 132/86   Pulse 71   Ht 5' 10.5" (1.791 m)   Wt 177 lb (80.3 kg)   SpO2 98%   BMI 25.04 kg/m   Review of Systems Denies LOC    Objective:   Physical Exam VITAL SIGNS:  See vs page GENERAL: no distress Pulses: dorsalis pedis intact bilat.   MSK: no deformity of the feet CV: no leg edema Skin:  no ulcer on the feet.  normal color and temp on the feet. Neuro: sensation is intact to touch on the feet, but decreased from normal.     A1c=6.9%    Assessment & Plan:  Insulin-requiring type 2 DM: slightly overcontrolled.    Hypoglycemia, new.  CAD: in this setting, he needs to avoid hypoglycemia.

## 2015-10-10 NOTE — Patient Instructions (Addendum)
Please continue the novolog, 10 units with supper only, and: Reduce the lantus to 30 units each morning.  On this type of insulin schedule, you should eat meals on a regular schedule.  If a meal is missed or significantly delayed, your blood sugar could go low.  Therefore, you should carry a non-perishable snack, such as crackers, while at work.  check your blood sugar twice a day.  vary the time of day when you check, between before the 3 meals, and at bedtime.  also check if you have symptoms of your blood sugar being too high or too low.  please keep a record of the readings and bring it to your next appointment here (or you can bring the meter itself).  You can write it on any piece of paper.  please call us sooner if your blood sugar goes below 70, or if you have a lot of readings over 200.   Please come back for a follow-up appointment in 4 months.

## 2015-10-11 ENCOUNTER — Encounter (HOSPITAL_COMMUNITY): Payer: Commercial Managed Care - HMO

## 2015-10-13 ENCOUNTER — Encounter (HOSPITAL_COMMUNITY)
Admission: RE | Admit: 2015-10-13 | Discharge: 2015-10-13 | Disposition: A | Discharge status: Home / Self Care | Payer: Commercial Managed Care - HMO | Source: Ambulatory Visit | Attending: Interventional Cardiology | Admitting: Interventional Cardiology

## 2015-10-13 ENCOUNTER — Encounter (HOSPITAL_COMMUNITY): Payer: Commercial Managed Care - HMO

## 2015-10-13 DIAGNOSIS — Z951 Presence of aortocoronary bypass graft: Secondary | ICD-10-CM | POA: Diagnosis not present

## 2015-10-16 ENCOUNTER — Encounter (HOSPITAL_COMMUNITY)
Admission: RE | Admit: 2015-10-16 | Discharge: 2015-10-16 | Disposition: A | Discharge status: Home / Self Care | Payer: Commercial Managed Care - HMO | Source: Ambulatory Visit | Attending: Interventional Cardiology | Admitting: Interventional Cardiology

## 2015-10-16 ENCOUNTER — Encounter (HOSPITAL_COMMUNITY): Payer: Commercial Managed Care - HMO

## 2015-10-16 DIAGNOSIS — Z951 Presence of aortocoronary bypass graft: Secondary | ICD-10-CM | POA: Diagnosis not present

## 2015-10-16 LAB — GLUCOSE, CAPILLARY: GLUCOSE-CAPILLARY: 196 mg/dL — AB (ref 65–99)

## 2015-10-18 ENCOUNTER — Encounter (HOSPITAL_COMMUNITY): Admission: RE | Admit: 2015-10-18 | Payer: Commercial Managed Care - HMO | Source: Ambulatory Visit

## 2015-10-18 ENCOUNTER — Encounter (HOSPITAL_COMMUNITY): Payer: Commercial Managed Care - HMO

## 2015-10-20 ENCOUNTER — Encounter (HOSPITAL_COMMUNITY)
Admission: RE | Admit: 2015-10-20 | Discharge: 2015-10-20 | Disposition: A | Discharge status: Home / Self Care | Payer: Commercial Managed Care - HMO | Source: Ambulatory Visit | Attending: Interventional Cardiology | Admitting: Interventional Cardiology

## 2015-10-20 ENCOUNTER — Encounter (HOSPITAL_COMMUNITY): Payer: Commercial Managed Care - HMO

## 2015-10-20 DIAGNOSIS — Z951 Presence of aortocoronary bypass graft: Secondary | ICD-10-CM | POA: Diagnosis not present

## 2015-10-20 LAB — GLUCOSE, CAPILLARY: Glucose-Capillary: 126 mg/dL — ABNORMAL HIGH (ref 65–99)

## 2015-10-23 ENCOUNTER — Encounter (HOSPITAL_COMMUNITY): Payer: Commercial Managed Care - HMO

## 2015-10-23 ENCOUNTER — Encounter (HOSPITAL_COMMUNITY)
Admission: RE | Admit: 2015-10-23 | Discharge: 2015-10-23 | Disposition: A | Discharge status: Home / Self Care | Payer: Commercial Managed Care - HMO | Source: Ambulatory Visit | Attending: Interventional Cardiology | Admitting: Interventional Cardiology

## 2015-10-23 DIAGNOSIS — Z951 Presence of aortocoronary bypass graft: Secondary | ICD-10-CM | POA: Diagnosis not present

## 2015-10-23 LAB — GLUCOSE, CAPILLARY: Glucose-Capillary: 229 mg/dL — ABNORMAL HIGH (ref 65–99)

## 2015-10-24 ENCOUNTER — Encounter: Payer: Self-pay | Admitting: Interventional Cardiology

## 2015-10-24 DIAGNOSIS — E1121 Type 2 diabetes mellitus with diabetic nephropathy: Secondary | ICD-10-CM | POA: Diagnosis not present

## 2015-10-24 DIAGNOSIS — E113599 Type 2 diabetes mellitus with proliferative diabetic retinopathy without macular edema, unspecified eye: Secondary | ICD-10-CM | POA: Diagnosis not present

## 2015-10-24 DIAGNOSIS — I25119 Atherosclerotic heart disease of native coronary artery with unspecified angina pectoris: Secondary | ICD-10-CM | POA: Diagnosis not present

## 2015-10-24 DIAGNOSIS — I1310 Hypertensive heart and chronic kidney disease without heart failure, with stage 1 through stage 4 chronic kidney disease, or unspecified chronic kidney disease: Secondary | ICD-10-CM | POA: Diagnosis not present

## 2015-10-24 DIAGNOSIS — E782 Mixed hyperlipidemia: Secondary | ICD-10-CM | POA: Diagnosis not present

## 2015-10-24 DIAGNOSIS — N183 Chronic kidney disease, stage 3 (moderate): Secondary | ICD-10-CM | POA: Diagnosis not present

## 2015-10-25 ENCOUNTER — Encounter (HOSPITAL_COMMUNITY): Payer: Commercial Managed Care - HMO

## 2015-10-26 NOTE — Progress Notes (Signed)
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  acetaminophen (TYLENOL) 500 MG tablet, Take 500 mg by mouth daily as needed., Disp: , Rfl:  .  aspirin EC 325 MG tablet, Take 1 tablet (325 mg total) by mouth daily., Disp: , Rfl:  .  B Complex-C (B-COMPLEX WITH VITAMIN C) tablet, Take 1 tablet by mouth daily., Disp: , Rfl:  .  Calcium Carb-Cholecalciferol (CALCIUM 600 + D PO), Take 600 mg by mouth daily., Disp: , Rfl:  .  Cholecalciferol (VITAMIN D3 SUPER STRENGTH PO), Take 1 tablet by mouth daily., Disp: , Rfl:  .  Coenzyme Q10 (COQ10) 50 MG CAPS, Take 50 mg by mouth daily., Disp: , Rfl:  .  Cyanocobalamin (VITAMIN B-12) 5000 MCG TBDP, Take 5,000 mcg by mouth daily., Disp: , Rfl:  .  Folic Acid 0.8 MG CAPS, Take 1 capsule by mouth daily., Disp: , Rfl:  .  Garlic 3570 MG CAPS, Take 1,000 mg by mouth daily., Disp: , Rfl:  .  insulin aspart (NOVOLOG FLEXPEN) 100 UNIT/ML FlexPen, Inject 10 Units into the skin daily with supper. And pen needles 2/day, Disp: 15 mL, Rfl: 11 .  Insulin Glargine (LANTUS SOLOSTAR) 100 UNIT/ML Solostar Pen, Inject 30 Units into the skin every morning., Disp: 15 mL, Rfl: 11 .  Magnesium 250 MG TABS, Take 250 mg by mouth daily., Disp: , Rfl:  .  metoprolol succinate (TOPROL-XL) 25 MG 24 hr tablet, Take 1 tablet (25 mg total) by mouth 2 (two) times daily., Disp: 180 tablet, Rfl: 3 .  Omega-3 Fatty Acids (FISH OIL) 1200 MG CAPS,  Take 1 capsule by mouth daily., Disp: , Rfl:  .  simvastatin (ZOCOR) 40 MG tablet, Take 40 mg by mouth daily., Disp: , Rfl:  .  Zinc 50 MG CAPS, Take 50 mg by mouth daily. , Disp: , Rfl:   Past Medical History: Past Medical History:  Diagnosis Date  . Chronic kidney disease   . Coronary artery disease   . CVA (cerebral infarction)    08/2011, NO RESIDUAL DEFICITS  . Diabetes mellitus without complication (HCC)    type II  . ED (erectile dysfunction)   . Gout   . History of kidney stones   . Hyperlipidemia   . Hypertension   . Irregular heart beat    "a long time ago"  . Myocardial infarction    1997    Tobacco Use: History  Smoking Status  . Never Smoker  Smokeless Tobacco  . Never Used    Labs: Recent Review Flowsheet Data    Labs for ITP Cardiac and Pulmonary Rehab Latest Ref Rng & Units 07/04/2015 07/04/2015 07/05/2015 07/27/2015 10/10/2015   Cholestrol 125 - 200 mg/dL - - - 122(L) -   LDLCALC <130 mg/dL - - - 60 -   HDL >=40 mg/dL - - - 44 -   Trlycerides <150 mg/dL - - - 90 -   Hemoglobin A1c - - - - -  6.9   PHART 7.350 - 7.450 7.377 - - - -   PCO2ART 35.0 - 45.0 mmHg 41.5 - - - -   HCO3 20.0 - 24.0 mEq/L 24.5(H) - - - -   TCO2 0 - 100 mmol/L '26 26 26 '$ - -   ACIDBASEDEF 0.0 - 2.0 mmol/L 1.0 - - - -   O2SAT % 98.0 - - - -      Capillary Blood Glucose: Lab Results  Component Value Date   GLUCAP 229 (H) 10/23/2015   GLUCAP 126 (H) 10/20/2015   GLUCAP 196 (H) 10/16/2015   GLUCAP 160 (H) 10/09/2015   GLUCAP 171 (H) 10/06/2015     Exercise Target Goals:    Exercise Program Goal: Individual exercise prescription set with THRR, safety & activity barriers. Participant demonstrates ability to understand and report RPE using BORG scale, to self-measure pulse accurately, and to acknowledge the importance of the exercise prescription.  Exercise Prescription Goal: Starting with aerobic activity 30 plus minutes a day, 3 days per week for initial exercise  prescription. Provide home exercise prescription and guidelines that participant acknowledges understanding prior to discharge.  Activity Barriers & Risk Stratification:     Activity Barriers & Cardiac Risk Stratification - 09/07/15 0846      Activity Barriers & Cardiac Risk Stratification   Activity Barriers Other (comment)   Comments R arm/shoulder nerve pain   Cardiac Risk Stratification High      6 Minute Walk:     6 Minute Walk    Row Name 09/07/15 1406         6 Minute Walk   Phase Initial     Distance 1602 feet     Walk Time 6 minutes     # of Rest Breaks 0     MPH 3.03     METS 3.8     RPE 13     VO2 Peak 13.4     Symptoms No     Resting HR 80 bpm     Resting BP 142/78     Max Ex. HR 102 bpm     Max Ex. BP 138/64     2 Minute Post BP 134/86        Initial Exercise Prescription:     Initial Exercise Prescription - 09/07/15 1400      Date of Initial Exercise RX and Referring Provider   Date 09/07/15   Referring Provider Daneen Schick     Bike   Level 0.8   Minutes 10   METs 2.78     NuStep   Level 3   Minutes 10   METs 2     Track   Laps 8   Minutes 10   METs 2.74     Prescription Details   Frequency (times per week) 3   Duration Progress to 30 minutes of continuous aerobic without signs/symptoms of physical distress     Intensity   THRR 40-80% of Max Heartrate 60-121   Ratings of Perceived Exertion 11-13   Perceived Dyspnea 0-4     Progression   Progression Continue to progress workloads to maintain intensity without signs/symptoms of physical distress.     Resistance Training   Training Prescription Yes   Weight 2lbs   Reps 10-12      Perform Capillary Blood Glucose checks as needed.  Exercise Prescription Changes:     Exercise Prescription Changes    Row Name 09/28/15 1100 10/18/15 1100  Exercise Review   Progression Yes Yes        Response to Exercise   Blood Pressure (Admit) 130/76 140/80      Blood  Pressure (Exercise) 140/72 130/80      Blood Pressure (Exit) 130/80 114/64      Heart Rate (Admit) 84 bpm 91 bpm      Heart Rate (Exercise) 107 bpm 110 bpm      Heart Rate (Exit) 84 bpm 72 bpm      Rating of Perceived Exertion (Exercise) 12 11      Comments Reviewed HEP on 09/25/15 Reviewed HEP on 09/25/15      Duration Progress to 30 minutes of continuous aerobic without signs/symptoms of physical distress Progress to 30 minutes of continuous aerobic without signs/symptoms of physical distress      Intensity THRR unchanged THRR unchanged        Progression   Progression Continue to progress workloads to maintain intensity without signs/symptoms of physical distress. Continue to progress workloads to maintain intensity without signs/symptoms of physical distress.      Average METs 2.2 2.2        Resistance Training   Training Prescription Yes Yes      Weight 2lbs 3lbs      Reps 10-12 10-12        Bike   Level 0.9 0.9      Minutes 10 10      METs 2.78 2.78        NuStep   Level 3 3      Minutes 10 10      METs 2 2.7        Track   Laps 11 15      Minutes 10 10      METs 2.92 3.6        Home Exercise Plan   Plans to continue exercise at Home  Reviewed 09/25/15 See progress note Home  Reviewed 09/25/15 See progress note      Frequency Add 2 additional days to program exercise sessions. Add 2 additional days to program exercise sessions.         Exercise Comments:     Exercise Comments    Row Name 09/28/15 1145 10/18/15 1127         Exercise Comments Reviewed METs and goals. Pt initated HEP this week; pt is tolerating exercise well; will continue to monitor exercise progression Reviewed METs and goals. Pt initated HEP this week; pt is tolerating exercise well; will continue to monitor exercise progression         Discharge Exercise Prescription (Final Exercise Prescription Changes):     Exercise Prescription Changes - 10/18/15 1100      Exercise Review    Progression Yes     Response to Exercise   Blood Pressure (Admit) 140/80   Blood Pressure (Exercise) 130/80   Blood Pressure (Exit) 114/64   Heart Rate (Admit) 91 bpm   Heart Rate (Exercise) 110 bpm   Heart Rate (Exit) 72 bpm   Rating of Perceived Exertion (Exercise) 11   Comments Reviewed HEP on 09/25/15   Duration Progress to 30 minutes of continuous aerobic without signs/symptoms of physical distress   Intensity THRR unchanged     Progression   Progression Continue to progress workloads to maintain intensity without signs/symptoms of physical distress.   Average METs 2.2     Resistance Training   Training Prescription Yes   Weight 3lbs   Reps 10-12  Bike   Level 0.9   Minutes 10   METs 2.78     NuStep   Level 3   Minutes 10   METs 2.7     Track   Laps 15   Minutes 10   METs 3.6     Home Exercise Plan   Plans to continue exercise at Home  Reviewed 09/25/15 See progress note   Frequency Add 2 additional days to program exercise sessions.      Nutrition:  Target Goals: Understanding of nutrition guidelines, daily intake of sodium '1500mg'$ , cholesterol '200mg'$ , calories 30% from fat and 7% or less from saturated fats, daily to have 5 or more servings of fruits and vegetables.  Biometrics:     Pre Biometrics - 09/07/15 1405      Pre Biometrics   Waist Circumference 38.25 inches   Hip Circumference 36 inches   Waist to Hip Ratio 1.06 %   Triceps Skinfold 15 mm   % Body Fat 25.3 %   Grip Strength 29 kg   Flexibility 11 in   Single Leg Stand 21.31 seconds       Nutrition Therapy Plan and Nutrition Goals:     Nutrition Therapy & Goals - 09/13/15 1023      Nutrition Therapy   Diet Carb Modified, Therapeutic Lifestyle Changes     Personal Nutrition Goals   Personal Goal #1 Improved glycemic control as evidenced by a decrease in A1c from 9.7 toward less than 7.0     Intervention Plan   Intervention Prescribe, educate and counsel regarding  individualized specific dietary modifications aiming towards targeted core components such as weight, hypertension, lipid management, diabetes, heart failure and other comorbidities.   Expected Outcomes Short Term Goal: Understand basic principles of dietary content, such as calories, fat, sodium, cholesterol and nutrients.;Long Term Goal: Adherence to prescribed nutrition plan.      Nutrition Discharge: Nutrition Scores:     Nutrition Assessments - 09/29/15 0926      MEDFICTS Scores   Pre Score 35      Nutrition Goals Re-Evaluation:   Psychosocial: Target Goals: Acknowledge presence or absence of depression, maximize coping skills, provide positive support system. Participant is able to verbalize types and ability to use techniques and skills needed for reducing stress and depression.  Initial Review & Psychosocial Screening:     Initial Psych Review & Screening - 09/08/15 [place]? Yes      Quality of Life Scores:     Quality of Life - 10/11/15 1732      Quality of Life Scores   GLOBAL Pre --  pt has overall good qol life scores, pt exhibits positve outlook and good coping skills.       PHQ-9: Recent Review Flowsheet Data    Depression screen [facility] 2/9 09/11/2015   Decreased Interest 0   Down, Depressed, Hopeless 0   PHQ - 2 Score 0      Psychosocial Evaluation and Intervention:     Psychosocial Evaluation - 10/26/15 1536      Psychosocial Evaluation & Interventions   Interventions Encouraged to exercise with the program and follow exercise prescription   Comments no psychosocial needs identified, no interventions necessary. pt expresses his biggest concern is continued numbness and weakness in right hand. pt encouraged to continue OT home exercises to increase strength and function.  pt is also eager to return to work.  Psychosocial Re-Evaluation:   Vocational Rehabilitation: Provide vocational rehab  assistance to qualifying candidates.   Vocational Rehab Evaluation & Intervention:     Vocational Rehab - 09/08/15 1024      Initial Vocational Rehab Evaluation & Intervention   Assessment shows need for Vocational Rehabilitation No  Pt feels able to return back to his job without any difficulty.        Education: Education Goals: Education classes will be provided on a weekly basis, covering required topics. Participant will state understanding/return demonstration of topics presented.  Learning Barriers/Preferences:     Learning Barriers/Preferences - 09/07/15 0846      Learning Barriers/Preferences   Learning Barriers Sight   Learning Preferences Written Material      Education Topics: Count Your Pulse:  -Group instruction provided by verbal instruction, demonstration, patient participation and written materials to support subject.  Instructors address importance of being able to find your pulse and how to count your pulse when at home without a heart monitor.  Patients get hands on experience counting their pulse with staff help and individually.   Heart Attack, Angina, and Risk Factor Modification:  -Group instruction provided by verbal instruction, video, and written materials to support subject.  Instructors address signs and symptoms of angina and heart attacks.    Also discuss risk factors for heart disease and how to make changes to improve heart health risk factors.   Functional Fitness:  -Group instruction provided by verbal instruction, demonstration, patient participation, and written materials to support subject.  Instructors address safety measures for doing things around the house.  Discuss how to get up and down off the floor, how to pick things up properly, how to safely get out of a chair without assistance, and balance training. Flowsheet Row CARDIAC REHAB PHASE II EXERCISE from 09/29/2015 in [place]  Date  09/29/15   Instruction Review Code  2- meets goals/outcomes      Meditation and Mindfulness:  -Group instruction provided by verbal instruction, patient participation, and written materials to support subject.  Instructor addresses importance of mindfulness and meditation practice to help reduce stress and improve awareness.  Instructor also leads participants through a meditation exercise.    Stretching for Flexibility and Mobility:  -Group instruction provided by verbal instruction, patient participation, and written materials to support subject.  Instructors lead participants through series of stretches that are designed to increase flexibility thus improving mobility.  These stretches are additional exercise for major muscle groups that are typically performed during regular warm up and cool down.   Hands Only CPR Anytime:  -Group instruction provided by verbal instruction, video, patient participation and written materials to support subject.  Instructors co-teach with AHA video for hands only CPR.  Participants get hands on experience with mannequins.   Nutrition I class: Heart Healthy Eating:  -Group instruction provided by PowerPoint slides, verbal discussion, and written materials to support subject matter. The instructor gives an explanation and review of the Therapeutic Lifestyle Changes diet recommendations, which includes a discussion on lipid goals, dietary fat, sodium, fiber, plant stanol/sterol esters, sugar, and the components of a well-balanced, healthy diet. Flowsheet Row CARDIAC REHAB PHASE II EXERCISE from 09/29/2015 in [place]  Date  09/29/15  Educator  RD  Instruction Review Code  Not applicable [class handouts given]      Nutrition II class: Lifestyle Skills:  -Group instruction provided by PowerPoint slides, verbal discussion, and written materials to support  subject matter. The instructor gives an explanation and review of label reading,  grocery shopping for heart health, heart healthy recipe modifications, and ways to make healthier choices when eating out. Flowsheet Row CARDIAC REHAB PHASE II EXERCISE from 09/29/2015 in [place]  Date  09/29/15  Educator  RD  Instruction Review Code  Not applicable [class handouts given]      Diabetes Question & Answer:  -Group instruction provided by PowerPoint slides, verbal discussion, and written materials to support subject matter. The instructor gives an explanation and review of diabetes co-morbidities, pre- and post-prandial blood glucose goals, pre-exercise blood glucose goals, signs, symptoms, and treatment of hypoglycemia and hyperglycemia, and foot care basics.   Diabetes Blitz:  -Group instruction provided by PowerPoint slides, verbal discussion, and written materials to support subject matter. The instructor gives an explanation and review of the physiology behind type 1 and type 2 diabetes, diabetes medications and rational behind using different medications, pre- and post-prandial blood glucose recommendations and Hemoglobin A1c goals, diabetes diet, and exercise including blood glucose guidelines for exercising safely.  Flowsheet Row CARDIAC REHAB PHASE II EXERCISE from 09/29/2015 in [place]  Date  09/29/15  Educator  RD  Instruction Review Code  Not applicable [class handouts given]      Portion Distortion:  -Group instruction provided by PowerPoint slides, verbal discussion, written materials, and food models to support subject matter. The instructor gives an explanation of serving size versus portion size, changes in portions sizes over the last 20 years, and what consists of a serving from each food group.   Stress Management:  -Group instruction provided by verbal instruction, video, and written materials to support subject matter.  Instructors review role of stress in heart disease and how to cope  with stress positively.     Exercising on Your Own:  -Group instruction provided by verbal instruction, power point, and written materials to support subject.  Instructors discuss benefits of exercise, components of exercise, frequency and intensity of exercise, and end points for exercise.  Also discuss use of nitroglycerin and activating EMS.  Review options of places to exercise outside of rehab.  Review guidelines for sex with heart disease.   Cardiac Drugs I:  -Group instruction provided by verbal instruction and written materials to support subject.  Instructor reviews cardiac drug classes: antiplatelets, anticoagulants, beta blockers, and statins.  Instructor discusses reasons, side effects, and lifestyle considerations for each drug class.   Cardiac Drugs II:  -Group instruction provided by verbal instruction and written materials to support subject.  Instructor reviews cardiac drug classes: angiotensin converting enzyme inhibitors (ACE-I), angiotensin II receptor blockers (ARBs), nitrates, and calcium channel blockers.  Instructor discusses reasons, side effects, and lifestyle considerations for each drug class.   Anatomy and Physiology of the Circulatory System:  -Group instruction provided by verbal instruction, video, and written materials to support subject.  Reviews functional anatomy of heart, how it relates to various diagnoses, and what role the heart plays in the overall system. Flowsheet Row CARDIAC REHAB PHASE II EXERCISE from 09/29/2015 in [place]  Date  09/20/15  Instruction Review Code  2- meets goals/outcomes      Knowledge Questionnaire Score:     Knowledge Questionnaire Score - 09/13/15 1024      Knowledge Questionnaire Score   Pre Score 23/28     DM 14/15      Core Components/Risk Factors/Patient Goals at Admission:  Personal Goals and Risk Factors at Admission - 09/07/15 0849      Core Components/Risk  Factors/Patient Goals on Admission   Personal Goal Other Yes   Personal Goal short: be able to pick up more than 10lbs, long be able to cut grass and do house work   Intervention Provide exercise programming to assist with improving strength and cardiovascular fitness   Expected Outcomes pt will be able to lift more than 10lbs, cut grass and do house work      Core Components/Risk Factors/Patient Goals Review:      Goals and Risk Factor Review    Row Name 09/28/15 1146 09/29/15 1110 10/16/15 0939         Core Components/Risk Factors/Patient Goals Review   Personal Goals Review Other;Increase Strength and Stamina  - Increase Strength and Stamina     Review Initated HEP and pt will walk at home and go to gym 2x/week Pt returns to work today and feels as though he is getting stronger Pt is able to lift 10lbs without difficulty. Able to perform household chores with less fatigue and "feels great everyday when he wakes up.'     Expected Outcomes Pt will increase energy levels and exercise capacity Pt will increase energy levels and exercise capacity Pt will continue to get stronger and perform ADL's with little fatigue.        Core Components/Risk Factors/Patient Goals at Discharge (Final Review):      Goals and Risk Factor Review - 10/16/15 0939      Core Components/Risk Factors/Patient Goals Review   Personal Goals Review Increase Strength and Stamina   Review Pt is able to lift 10lbs without difficulty. Able to perform household chores with less fatigue and "feels great everyday when he wakes up.'   Expected Outcomes Pt will continue to get stronger and perform ADL's with little fatigue.      ITP Comments:     ITP Comments    Row Name 09/07/15 0844 10/20/15 1346         ITP Comments Dr. Fransico Him, Medical Director Patient attended the "Taking the high out of high blood pressure" video/lecture education class on 10/20/15. Met outcomes/goals.         Comments:  Pt is  making expected progress toward personal goals after completing 13 sessions. Pt attends exercise 2 x week due to his work schedule.  Pt has a positive outlook and has support from friends and family. Recommend continued exercise and life style modification education including  stress management and relaxation techniques to decrease cardiac risk profile.

## 2015-10-27 ENCOUNTER — Encounter (HOSPITAL_COMMUNITY)
Admission: RE | Admit: 2015-10-27 | Discharge: 2015-10-27 | Disposition: A | Discharge status: Home / Self Care | Payer: Commercial Managed Care - HMO | Source: Ambulatory Visit | Attending: Interventional Cardiology | Admitting: Interventional Cardiology

## 2015-10-27 ENCOUNTER — Encounter (HOSPITAL_COMMUNITY): Payer: Commercial Managed Care - HMO

## 2015-10-27 DIAGNOSIS — Z951 Presence of aortocoronary bypass graft: Secondary | ICD-10-CM | POA: Diagnosis not present

## 2015-10-30 ENCOUNTER — Encounter (HOSPITAL_COMMUNITY)
Admission: RE | Admit: 2015-10-30 | Discharge: 2015-10-30 | Disposition: A | Discharge status: Home / Self Care | Payer: Commercial Managed Care - HMO | Source: Ambulatory Visit | Attending: Interventional Cardiology | Admitting: Interventional Cardiology

## 2015-10-30 ENCOUNTER — Encounter (HOSPITAL_COMMUNITY): Payer: Commercial Managed Care - HMO

## 2015-10-30 DIAGNOSIS — Z951 Presence of aortocoronary bypass graft: Secondary | ICD-10-CM | POA: Diagnosis not present

## 2015-10-30 LAB — GLUCOSE, CAPILLARY: GLUCOSE-CAPILLARY: 236 mg/dL — AB (ref 65–99)

## 2015-11-01 ENCOUNTER — Encounter (HOSPITAL_COMMUNITY): Payer: Commercial Managed Care - HMO

## 2015-11-03 ENCOUNTER — Encounter (HOSPITAL_COMMUNITY): Payer: Commercial Managed Care - HMO

## 2015-11-03 ENCOUNTER — Encounter (HOSPITAL_COMMUNITY)
Admission: RE | Admit: 2015-11-03 | Discharge: 2015-11-03 | Disposition: A | Discharge status: Home / Self Care | Payer: Commercial Managed Care - HMO | Source: Ambulatory Visit | Attending: Interventional Cardiology | Admitting: Interventional Cardiology

## 2015-11-03 DIAGNOSIS — Z951 Presence of aortocoronary bypass graft: Secondary | ICD-10-CM

## 2015-11-06 ENCOUNTER — Encounter (HOSPITAL_COMMUNITY): Payer: Commercial Managed Care - HMO

## 2015-11-08 ENCOUNTER — Encounter (HOSPITAL_COMMUNITY): Payer: Commercial Managed Care - HMO

## 2015-11-08 ENCOUNTER — Encounter (HOSPITAL_COMMUNITY): Admission: RE | Admit: 2015-11-08 | Payer: Commercial Managed Care - HMO | Source: Ambulatory Visit

## 2015-11-10 ENCOUNTER — Encounter (HOSPITAL_COMMUNITY)
Admission: RE | Admit: 2015-11-10 | Discharge: 2015-11-10 | Disposition: A | Discharge status: Home / Self Care | Payer: Commercial Managed Care - HMO | Source: Ambulatory Visit | Attending: Interventional Cardiology | Admitting: Interventional Cardiology

## 2015-11-10 ENCOUNTER — Encounter (HOSPITAL_COMMUNITY): Payer: Commercial Managed Care - HMO

## 2015-11-10 DIAGNOSIS — Z951 Presence of aortocoronary bypass graft: Secondary | ICD-10-CM | POA: Diagnosis not present

## 2015-11-13 ENCOUNTER — Encounter (HOSPITAL_COMMUNITY)
Admission: RE | Admit: 2015-11-13 | Discharge: 2015-11-13 | Disposition: A | Discharge status: Home / Self Care | Payer: Commercial Managed Care - HMO | Source: Ambulatory Visit | Attending: Interventional Cardiology | Admitting: Interventional Cardiology

## 2015-11-13 ENCOUNTER — Encounter (HOSPITAL_COMMUNITY): Payer: Commercial Managed Care - HMO

## 2015-11-13 DIAGNOSIS — Z951 Presence of aortocoronary bypass graft: Secondary | ICD-10-CM | POA: Diagnosis not present

## 2015-11-13 LAB — GLUCOSE, CAPILLARY: Glucose-Capillary: 205 mg/dL — ABNORMAL HIGH (ref 65–99)

## 2015-11-14 ENCOUNTER — Encounter: Payer: Self-pay | Admitting: Interventional Cardiology

## 2015-11-15 ENCOUNTER — Encounter (HOSPITAL_COMMUNITY): Payer: Commercial Managed Care - HMO

## 2015-11-17 ENCOUNTER — Encounter (HOSPITAL_COMMUNITY): Payer: Commercial Managed Care - HMO

## 2015-11-20 ENCOUNTER — Encounter (HOSPITAL_COMMUNITY): Payer: Commercial Managed Care - HMO

## 2015-11-20 ENCOUNTER — Encounter (HOSPITAL_COMMUNITY)
Admission: RE | Admit: 2015-11-20 | Discharge: 2015-11-20 | Disposition: A | Discharge status: Home / Self Care | Payer: Commercial Managed Care - HMO | Source: Ambulatory Visit | Attending: Interventional Cardiology | Admitting: Interventional Cardiology

## 2015-11-20 DIAGNOSIS — Z951 Presence of aortocoronary bypass graft: Secondary | ICD-10-CM | POA: Diagnosis not present

## 2015-11-20 LAB — GLUCOSE, CAPILLARY: GLUCOSE-CAPILLARY: 328 mg/dL — AB (ref 65–99)

## 2015-11-20 NOTE — Progress Notes (Signed)
Incomplete Session Note  Patient Details  Name: Joseph Sanford MRN: [id number] Date of Birth: February 23, 1945 Referring Provider:   Flowsheet Row CARDIAC REHAB PHASE II ORIENTATION from 09/07/2015 in [place]  Referring Provider  Daneen Schick      Selena Batten did not complete his rehab session. Pt pre exercise blood glucose was 328.  Pt had not checked his blood glucose at home cited running late for exercise. Pt remarked that he used the last of his insulin in the bottle he had at home. Pt insulin cost increased with his last refill.  Pt was trying to "stretch" it as far as he could to off set the additional cost.  Pt plan to talk with his MD office about changing to a more affordable insulin.  Pt also had to make changes regarding his meter. Pt was given a new meter but could not afford the cost of the strips. Pt advised to continue to monitor his blood glucose, take the additional prescribed insulin, eat breakfast and increase his water intake to lower the blood glucose reading.  Will follow up with pt on Friday when he returns to exercise. Cherre Huger, BSN

## 2015-11-22 ENCOUNTER — Encounter (HOSPITAL_COMMUNITY): Payer: Commercial Managed Care - HMO

## 2015-11-23 NOTE — Progress Notes (Signed)
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  acetaminophen (TYLENOL) 500 MG tablet, Take 500 mg by mouth daily as needed., Disp: , Rfl:  .  aspirin EC 325 MG tablet, Take 1 tablet (325 mg total) by mouth daily., Disp: , Rfl:  .  B Complex-C (B-COMPLEX WITH VITAMIN C) tablet, Take 1 tablet by mouth daily., Disp: , Rfl:  .  Calcium Carb-Cholecalciferol (CALCIUM 600 + D PO), Take 600 mg by mouth daily., Disp: , Rfl:  .  Cholecalciferol (VITAMIN D3 SUPER STRENGTH PO), Take 1 tablet by mouth daily., Disp: , Rfl:  .  Coenzyme Q10 (COQ10) 50 MG CAPS, Take 50 mg by mouth daily., Disp: , Rfl:  .  Cyanocobalamin (VITAMIN B-12) 5000 MCG TBDP, Take 5,000 mcg by mouth daily., Disp: , Rfl:  .  Folic Acid 0.8 MG CAPS, Take 1 capsule by mouth daily., Disp: , Rfl:  .  Garlic 3570 MG CAPS, Take 1,000 mg by mouth daily., Disp: , Rfl:  .  insulin aspart (NOVOLOG FLEXPEN) 100 UNIT/ML FlexPen, Inject 10 Units into the skin daily with supper. And pen needles 2/day, Disp: 15 mL, Rfl: 11 .  Insulin Glargine (LANTUS SOLOSTAR) 100 UNIT/ML Solostar Pen, Inject 30 Units into the skin every morning., Disp: 15 mL, Rfl: 11 .  Magnesium 250 MG TABS, Take 250 mg by mouth daily., Disp: , Rfl:  .  metoprolol succinate (TOPROL-XL) 25 MG 24 hr tablet, Take 1 tablet (25 mg total) by mouth 2 (two) times daily., Disp: 180 tablet, Rfl: 3 .  Omega-3 Fatty Acids (FISH OIL) 1200 MG CAPS,  Take 1 capsule by mouth daily., Disp: , Rfl:  .  simvastatin (ZOCOR) 40 MG tablet, Take 40 mg by mouth daily., Disp: , Rfl:  .  Zinc 50 MG CAPS, Take 50 mg by mouth daily. , Disp: , Rfl:   Past Medical History: Past Medical History:  Diagnosis Date  . Chronic kidney disease   . Coronary artery disease   . CVA (cerebral infarction)    08/2011, NO RESIDUAL DEFICITS  . Diabetes mellitus without complication (HCC)    type II  . ED (erectile dysfunction)   . Gout   . History of kidney stones   . Hyperlipidemia   . Hypertension   . Irregular heart beat    "a long time ago"  . Myocardial infarction    1997    Tobacco Use: History  Smoking Status  . Never Smoker  Smokeless Tobacco  . Never Used    Labs: Recent Review Flowsheet Data    Labs for ITP Cardiac and Pulmonary Rehab Latest Ref Rng & Units 07/04/2015 07/04/2015 07/05/2015 07/27/2015 10/10/2015   Cholestrol 125 - 200 mg/dL - - - 122(L) -   LDLCALC <130 mg/dL - - - 60 -   HDL >=40 mg/dL - - - 44 -   Trlycerides <150 mg/dL - - - 90 -   Hemoglobin A1c - - - - -  6.9   PHART 7.350 - 7.450 7.377 - - - -   PCO2ART 35.0 - 45.0 mmHg 41.5 - - - -   HCO3 20.0 - 24.0 mEq/L 24.5(H) - - - -   TCO2 0 - 100 mmol/L _0 - -   ACIDBASEDEF 0.0 - 2.0 mmol/L 1.0 - - - -   O2SAT % 98.0 - - - -      Capillary Blood Glucose: Lab Results  Component Value Date   GLUCAP 328 (H) 11/20/2015   GLUCAP 205 (H) 11/13/2015   GLUCAP 236 (H) 10/30/2015   GLUCAP 229 (H) 10/23/2015   GLUCAP 126 (H) 10/20/2015     Exercise Target Goals:    Exercise Program Goal: Individual exercise prescription set with THRR, safety & activity barriers. Participant demonstrates ability to understand and report RPE using BORG scale, to self-measure pulse accurately, and to acknowledge the importance of the exercise prescription.  Exercise Prescription Goal: Starting with aerobic activity 30 plus minutes a day, 3 days per week for initial exercise  prescription. Provide home exercise prescription and guidelines that participant acknowledges understanding prior to discharge.  Activity Barriers & Risk Stratification:     Activity Barriers & Cardiac Risk Stratification - 09/07/15 0846      Activity Barriers & Cardiac Risk Stratification   Activity Barriers Other (comment)   Comments R arm/shoulder nerve pain   Cardiac Risk Stratification High      6 Minute Walk:     6 Minute Walk    Row Name 09/07/15 1406         6 Minute Walk   Phase Initial     Distance 1602 feet     Walk Time 6 minutes     # of Rest Breaks 0     MPH 3.03     METS 3.8     RPE 13     VO2 Peak 13.4     Symptoms No     Resting HR 80 bpm     Resting BP 142/78     Max Ex. HR 102 bpm     Max Ex. BP 138/64     2 Minute Post BP 134/86        Initial Exercise Prescription:     Initial Exercise Prescription - 09/07/15 1400      Date of Initial Exercise RX and Referring Provider   Date 09/07/15   Referring Provider Daneen Schick     Bike   Level 0.8   Minutes 10   METs 2.78     NuStep   Level 3   Minutes 10   METs 2     Track   Laps 8   Minutes 10   METs 2.74     Prescription Details   Frequency (times per week) 3   Duration Progress to 30 minutes of continuous aerobic without signs/symptoms of physical distress     Intensity   THRR 40-80% of Max Heartrate 60-121   Ratings of Perceived Exertion 11-13   Perceived Dyspnea 0-4     Progression   Progression Continue to progress workloads to maintain intensity without signs/symptoms of physical distress.     Resistance Training   Training Prescription Yes   Weight 2lbs   Reps 10-12      Perform Capillary Blood Glucose checks as needed.  Exercise Prescription Changes:     Exercise Prescription Changes    Row Name 09/28/15 1100 10/18/15 1100 11/10/15 1600  Exercise Review   Progression Yes Yes No       Response to Exercise   Blood Pressure (Admit) 130/76 140/80  138/80     Blood Pressure (Exercise) 140/72 130/80 162/80     Blood Pressure (Exit) 130/80 114/64 118/70     Heart Rate (Admit) 84 bpm 91 bpm 98 bpm     Heart Rate (Exercise) 107 bpm 110 bpm 106 bpm     Heart Rate (Exit) 84 bpm 72 bpm 97 bpm     Rating of Perceived Exertion (Exercise) _0 Comments Reviewed HEP on 09/25/15 Reviewed HEP on 09/25/15 Reviewed HEP on 09/25/15     Duration Progress to 30 minutes of continuous aerobic without signs/symptoms of physical distress Progress to 30 minutes of continuous aerobic without signs/symptoms of physical distress Progress to 30 minutes of continuous aerobic without signs/symptoms of physical distress     Intensity THRR unchanged THRR unchanged THRR unchanged       Progression   Progression Continue to progress workloads to maintain intensity without signs/symptoms of physical distress. Continue to progress workloads to maintain intensity without signs/symptoms of physical distress. Continue to progress workloads to maintain intensity without signs/symptoms of physical distress.     Average METs 2.2 2.2 2.5       Resistance Training   Training Prescription Yes Yes Yes     Weight 2lbs 3lbs 3lbs     Reps 10-12 10-12 10-12       Bike   Level 0.9 0.9 0.9     Minutes _1 METs 2.78 2.78 2.78       NuStep   Level _2 Minutes _3 METs 2 2.7 2.2       Track   Laps _4 Minutes _5 METs 2.92 3.6 3.6       Home Exercise Plan   Plans to continue exercise at Home  Reviewed 09/25/15 See progress note Home  Reviewed 09/25/15 See progress note Home  Reviewed 09/25/15 See progress note     Frequency Add 2 additional days to program exercise sessions. Add 2 additional days to program exercise sessions. Add 2 additional days to program exercise sessions.        Exercise Comments:     Exercise Comments    Row Name 09/28/15 1145 10/18/15 1127 11/10/15 1610       Exercise Comments Reviewed METs and  goals. Pt initated HEP this week; pt is tolerating exercise well; will continue to monitor exercise progression Reviewed METs and goals. Pt initated HEP this week; pt is tolerating exercise well; will continue to monitor exercise progression Reviewed METs and goals. Pt initated HEP this week; pt is tolerating exercise well; will continue to monitor exercise progression        Discharge Exercise Prescription (Final Exercise Prescription Changes):     Exercise Prescription Changes - 11/10/15 1600      Exercise Review   Progression No     Response to Exercise   Blood Pressure (Admit) 138/80   Blood Pressure (Exercise) 162/80   Blood Pressure (Exit) 118/70   Heart Rate (Admit) 98 bpm   Heart Rate (Exercise) 106 bpm   Heart Rate (Exit) 97 bpm   Rating of Perceived Exertion (Exercise) 11   Comments Reviewed HEP on 09/25/15   Duration Progress  to 30 minutes of continuous aerobic without signs/symptoms of physical distress   Intensity THRR unchanged     Progression   Progression Continue to progress workloads to maintain intensity without signs/symptoms of physical distress.   Average METs 2.5     Resistance Training   Training Prescription Yes   Weight 3lbs   Reps 10-12     Bike   Level 0.9   Minutes 10   METs 2.78     NuStep   Level 4   Minutes 10   METs 2.2     Track   Laps 15   Minutes 10   METs 3.6     Home Exercise Plan   Plans to continue exercise at Home  Reviewed 09/25/15 See progress note   Frequency Add 2 additional days to program exercise sessions.      Nutrition:  Target Goals: Understanding of nutrition guidelines, daily intake of sodium <1554m, cholesterol <2054m calories 30% from fat and 7% or less from saturated fats, daily to have 5 or more servings of fruits and vegetables.  Biometrics:     Pre Biometrics - 09/07/15 1405      Pre Biometrics   Waist Circumference 38.25 inches   Hip Circumference 36 inches   Waist to Hip Ratio 1.06 %    Triceps Skinfold 15 mm   % Body Fat 25.3 %   Grip Strength 29 kg   Flexibility 11 in   Single Leg Stand 21.31 seconds       Nutrition Therapy Plan and Nutrition Goals:     Nutrition Therapy & Goals - 09/13/15 1023      Nutrition Therapy   Diet Carb Modified, Therapeutic Lifestyle Changes     Personal Nutrition Goals   Personal Goal #1 Improved glycemic control as evidenced by a decrease in A1c from 9.7 toward less than 7.0     Intervention Plan   Intervention Prescribe, educate and counsel regarding individualized specific dietary modifications aiming towards targeted core components such as weight, hypertension, lipid management, diabetes, heart failure and other comorbidities.   Expected Outcomes Short Term Goal: Understand basic principles of dietary content, such as calories, fat, sodium, cholesterol and nutrients.;Long Term Goal: Adherence to prescribed nutrition plan.      Nutrition Discharge: Nutrition Scores:     Nutrition Assessments - 09/29/15 0926      MEDFICTS Scores   Pre Score 35      Nutrition Goals Re-Evaluation:   Psychosocial: Target Goals: Acknowledge presence or absence of depression, maximize coping skills, provide positive support system. Participant is able to verbalize types and ability to use techniques and skills needed for reducing stress and depression.  Initial Review & Psychosocial Screening:     Initial Psych Review & Screening - 09/08/15 [place]      Quality of Life Scores:     Quality of Life - 10/11/15 1732      Quality of Life Scores   GLOBAL Pre --  pt has overall good qol life scores, pt exhibits positve outlook and good coping skills.       PHQ-9: Recent Review Flowsheet Data    [facility]   Decreased Interest 0   Down, Depressed, Hopeless 0   PHQ - 2 Score 0      Psychosocial Evaluation and Intervention:     Psychosocial Evaluation -  11/23/15 1247  Psychosocial Evaluation & Interventions   Interventions Encouraged to exercise with the program and follow exercise prescription   Comments Pt with adequate support system.  Pt has returned back to work with no difficulites. No further psychosocial needed identified, no further intervention warranted.     Discharge Psychosocial Assessment & Intervention   Discharge Continue support measures as needed      Psychosocial Re-Evaluation:     Psychosocial Re-Evaluation    [place] Name 11/23/15 1248             Psychosocial Re-Evaluation   Interventions Encouraged to attend Cardiac Rehabilitation for the exercise          Vocational Rehabilitation: Provide vocational rehab assistance to qualifying candidates.   Vocational Rehab Evaluation & Intervention:     Vocational Rehab - 09/08/15 1024      Initial Vocational Rehab Evaluation & Intervention   Assessment shows need for Vocational Rehabilitation No  Pt feels able to return back to his job without any difficulty.        Education: Education Goals: Education classes will be provided on a weekly basis, covering required topics. Participant will state understanding/return demonstration of topics presented.  Learning Barriers/Preferences:     Learning Barriers/Preferences - 09/07/15 0846      Learning Barriers/Preferences   Learning Barriers Sight   Learning Preferences Written Material      Education Topics: Count Your Pulse:  -Group instruction provided by verbal instruction, demonstration, patient participation and written materials to support subject.  Instructors address importance of being able to find your pulse and how to count your pulse when at home without a heart monitor.  Patients get hands on experience counting their pulse with staff help and individually.   Heart Attack, Angina, and Risk Factor Modification:  -Group instruction provided by verbal instruction, video, and written  materials to support subject.  Instructors address signs and symptoms of angina and heart attacks.    Also discuss risk factors for heart disease and how to make changes to improve heart health risk factors.   Functional Fitness:  -Group instruction provided by verbal instruction, demonstration, patient participation, and written materials to support subject.  Instructors address safety measures for doing things around the house.  Discuss how to get up and down off the floor, how to pick things up properly, how to safely get out of a chair without assistance, and balance training. Flowsheet Row CARDIAC REHAB PHASE II EXERCISE from 11/10/2015 in [place]  Date  09/29/15  Instruction Review Code  2- meets goals/outcomes      Meditation and Mindfulness:  -Group instruction provided by verbal instruction, patient participation, and written materials to support subject.  Instructor addresses importance of mindfulness and meditation practice to help reduce stress and improve awareness.  Instructor also leads participants through a meditation exercise.    Stretching for Flexibility and Mobility:  -Group instruction provided by verbal instruction, patient participation, and written materials to support subject.  Instructors lead participants through series of stretches that are designed to increase flexibility thus improving mobility.  These stretches are additional exercise for major muscle groups that are typically performed during regular warm up and cool down. Flowsheet Row CARDIAC REHAB PHASE II EXERCISE from 11/10/2015 in [place]  Date  11/10/15  Instruction Review Code  2- meets goals/outcomes      Hands Only CPR Anytime:  -Group instruction provided by verbal instruction, video, patient participation and written  materials to support subject.  Instructors co-teach with [facility] video for hands only CPR.  Participants get hands on  experience with mannequins.   Nutrition I class: Heart Healthy Eating:  -Group instruction provided by PowerPoint slides, verbal discussion, and written materials to support subject matter. The instructor gives an explanation and review of the Therapeutic Lifestyle Changes diet recommendations, which includes a discussion on lipid goals, dietary fat, sodium, fiber, plant stanol/sterol esters, sugar, and the components of a well-balanced, healthy diet. Flowsheet Row CARDIAC REHAB PHASE II EXERCISE from 11/10/2015 in [place]  Date  09/29/15  Educator  RD  Instruction Review Code  Not applicable [class handouts given]      Nutrition II class: Lifestyle Skills:  -Group instruction provided by PowerPoint slides, verbal discussion, and written materials to support subject matter. The instructor gives an explanation and review of label reading, grocery shopping for heart health, heart healthy recipe modifications, and ways to make healthier choices when eating out. Flowsheet Row CARDIAC REHAB PHASE II EXERCISE from 11/10/2015 in [place]  Date  09/29/15  Educator  RD  Instruction Review Code  Not applicable [class handouts given]      Diabetes Question & Answer:  -Group instruction provided by PowerPoint slides, verbal discussion, and written materials to support subject matter. The instructor gives an explanation and review of diabetes co-morbidities, pre- and post-prandial blood glucose goals, pre-exercise blood glucose goals, signs, symptoms, and treatment of hypoglycemia and hyperglycemia, and foot care basics. Flowsheet Row CARDIAC REHAB PHASE II EXERCISE from 11/10/2015 in [place]  Date  10/27/15  Educator  RD  Instruction Review Code  2- meets goals/outcomes      Diabetes Blitz:  -Group instruction provided by PowerPoint slides, verbal discussion, and written materials to support  subject matter. The instructor gives an explanation and review of the physiology behind type 1 and type 2 diabetes, diabetes medications and rational behind using different medications, pre- and post-prandial blood glucose recommendations and Hemoglobin A1c goals, diabetes diet, and exercise including blood glucose guidelines for exercising safely.  Flowsheet Row CARDIAC REHAB PHASE II EXERCISE from 11/10/2015 in [place]  Date  09/29/15  Educator  RD  Instruction Review Code  Not applicable [class handouts given]      Portion Distortion:  -Group instruction provided by PowerPoint slides, verbal discussion, written materials, and food models to support subject matter. The instructor gives an explanation of serving size versus portion size, changes in portions sizes over the last 20 years, and what consists of a serving from each food group.   Stress Management:  -Group instruction provided by verbal instruction, video, and written materials to support subject matter.  Instructors review role of stress in heart disease and how to cope with stress positively.     Exercising on Your Own:  -Group instruction provided by verbal instruction, power point, and written materials to support subject.  Instructors discuss benefits of exercise, components of exercise, frequency and intensity of exercise, and end points for exercise.  Also discuss use of nitroglycerin and activating EMS.  Review options of places to exercise outside of rehab.  Review guidelines for sex with heart disease.   Cardiac Drugs I:  -Group instruction provided by verbal instruction and written materials to support subject.  Instructor reviews cardiac drug classes: antiplatelets, anticoagulants, beta blockers, and statins.  Instructor discusses reasons, side effects, and lifestyle considerations for each  drug class.   Cardiac Drugs II:  -Group instruction provided by verbal instruction and written  materials to support subject.  Instructor reviews cardiac drug classes: angiotensin converting enzyme inhibitors (ACE-I), angiotensin II receptor blockers (ARBs), nitrates, and calcium channel blockers.  Instructor discusses reasons, side effects, and lifestyle considerations for each drug class.   Anatomy and Physiology of the Circulatory System:  -Group instruction provided by verbal instruction, video, and written materials to support subject.  Reviews functional anatomy of heart, how it relates to various diagnoses, and what role the heart plays in the overall system. Flowsheet Row CARDIAC REHAB PHASE II EXERCISE from 11/10/2015 in [place]  Date  09/20/15  Instruction Review Code  2- meets goals/outcomes      Knowledge Questionnaire Score:     Knowledge Questionnaire Score - 09/13/15 1024      Knowledge Questionnaire Score   Pre Score 23/28     DM 14/15      Core Components/Risk Factors/Patient Goals at Admission:     Personal Goals and Risk Factors at Admission - 09/07/15 0849      Core Components/Risk Factors/Patient Goals on Admission   Personal Goal Other Yes   Personal Goal short: be able to pick up more than 10lbs, long be able to cut grass and do house work   Intervention Provide exercise programming to assist with improving strength and cardiovascular fitness   Expected Outcomes pt will be able to lift more than 10lbs, cut grass and do house work      Core Components/Risk Factors/Patient Goals Review:      Goals and Risk Factor Review    Row Name 09/28/15 1146 09/29/15 1110 10/16/15 0939 11/20/15 1144       Core Components/Risk Factors/Patient Goals Review   Personal Goals Review Other;Increase Strength and Stamina  - Increase Strength and Stamina Increase Strength and Stamina    Review Initated HEP and pt will walk at home and go to gym 2x/week Pt returns to work today and feels as though he is getting stronger Pt is able to  lift 10lbs without difficulty. Able to perform household chores with less fatigue and "feels great everyday when he wakes up.' Pt is able to lift 20lbs without difficulty and Dr. approval. Able to perform household chores with less fatigue and "feels great everyday when he wakes up.'  Goes to [facility] on off days from [facility]    Expected Outcomes Pt will increase energy levels and exercise capacity Pt will increase energy levels and exercise capacity Pt will continue to get stronger and perform ADL's with little fatigue. Pt will continue to get stronger and perform ADL's with little fatigue.       Core Components/Risk Factors/Patient Goals at Discharge (Final Review):      Goals and Risk Factor Review - 11/20/15 1144      Core Components/Risk Factors/Patient Goals Review   Personal Goals Review Increase Strength and Stamina   Review Pt is able to lift 20lbs without difficulty and Dr. approval. Able to perform household chores with less fatigue and "feels great everyday when he wakes up.'  Goes to [facility] on off days from [facility]   Expected Outcomes Pt will continue to get stronger and perform ADL's with little fatigue.      ITP Comments:     ITP Comments    Row Name 09/07/15 0844 10/20/15 1346         ITP Comments Dr. Fransico Him, Medical Director  Patient attended the "Taking the high out of high blood pressure" video/lecture education class on 10/20/15. Met outcomes/goals.         Comments:  Pt is making expected progress toward personal goals after completing 18 ssions. Pt attends consistently twice a week on Mondays and Fridays due to his work schedule. Pt continues to have a knowledge deficit regarding medications and diabetes management despite given the information repeatedly. Pt has a girlfriend that he is hopeful will lead to something permanent.  This would be hopeful having someone in the home for closer monitoring. Psychosocial Assessment - No further psychosocial needed identified, no  further intervention warranted.  Recommend continued exercise and life style modification education including  stress management and relaxation techniques to decrease cardiac risk profile. Cherre Huger, BSN

## 2015-11-24 ENCOUNTER — Encounter (HOSPITAL_COMMUNITY)
Admission: RE | Admit: 2015-11-24 | Discharge: 2015-11-24 | Disposition: A | Discharge status: Home / Self Care | Payer: Commercial Managed Care - HMO | Source: Ambulatory Visit | Attending: Interventional Cardiology | Admitting: Interventional Cardiology

## 2015-11-24 ENCOUNTER — Encounter (HOSPITAL_COMMUNITY): Payer: Commercial Managed Care - HMO

## 2015-11-24 DIAGNOSIS — Z951 Presence of aortocoronary bypass graft: Secondary | ICD-10-CM | POA: Diagnosis not present

## 2015-11-24 LAB — GLUCOSE, CAPILLARY
Glucose-Capillary: 93 mg/dL (ref 65–99)
Glucose-Capillary: 130 mg/dL — ABNORMAL HIGH (ref 65–99)

## 2015-11-24 NOTE — Progress Notes (Signed)
Incomplete Session Note  Patient Details  Name: Joseph Sanford MRN: [id number] Date of Birth: [birth year] Referring Provider:   Flowsheet Row CARDIAC REHAB PHASE II ORIENTATION from 09/07/2015 in [place]  Referring Provider  Daneen Schick      Joseph Sanford did not complete his rehab session. Pre exercise blood glucose was 93.  Pt did not check his blood glucose this morning at home. Pt had not picked up his new meter.  Pt was getting a new meter due to the increaed cost of his present meter strips.  Pt took 15 units of insulin and ate an apple.  Pt given banana, graham crackers and peanut butter along with lemonade to drink.  Pt recheck 15 minutes  after finishing his snack.  130.  Pt advised he would not be able to exercise.  Pt will eat some additional breakfast along with protein on his way to work.  Pt strongly encouraged to make the time to get his new meter.  General guidelines given to pt regarding when to take his insulin.  Pt advised he must always eat when taking insulin.  Pt verbalized understanding. Cherre Huger, BSN

## 2015-11-27 ENCOUNTER — Encounter (HOSPITAL_COMMUNITY): Payer: Commercial Managed Care - HMO

## 2015-11-27 ENCOUNTER — Encounter: Payer: Self-pay | Admitting: Interventional Cardiology

## 2015-11-27 ENCOUNTER — Ambulatory Visit (INDEPENDENT_AMBULATORY_CARE_PROVIDER_SITE_OTHER): Payer: Commercial Managed Care - HMO | Admitting: Interventional Cardiology

## 2015-11-27 VITALS — BP 140/76 | HR 81 | Ht 70.5 in | Wt 180.4 lb

## 2015-11-27 DIAGNOSIS — E1122 Type 2 diabetes mellitus with diabetic chronic kidney disease: Secondary | ICD-10-CM

## 2015-11-27 DIAGNOSIS — N183 Chronic kidney disease, stage 3 unspecified: Secondary | ICD-10-CM

## 2015-11-27 DIAGNOSIS — E784 Other hyperlipidemia: Secondary | ICD-10-CM | POA: Diagnosis not present

## 2015-11-27 DIAGNOSIS — I25700 Atherosclerosis of coronary artery bypass graft(s), unspecified, with unstable angina pectoris: Secondary | ICD-10-CM

## 2015-11-27 DIAGNOSIS — E7849 Other hyperlipidemia: Secondary | ICD-10-CM

## 2015-11-27 DIAGNOSIS — Z951 Presence of aortocoronary bypass graft: Secondary | ICD-10-CM

## 2015-11-27 DIAGNOSIS — I10 Essential (primary) hypertension: Secondary | ICD-10-CM

## 2015-11-27 DIAGNOSIS — Z794 Long term (current) use of insulin: Secondary | ICD-10-CM

## 2015-11-27 NOTE — Progress Notes (Signed)
Cardiology Office Note    Date:  11/27/2015   ID:  Eilan, Matsunaga October 08, 1945, MRN [id number]  PCP:  Mayra Neer, MD  Cardiologist: Sinclair Grooms, MD   Chief Complaint  Patient presents with  . Coronary Artery Disease    History of Present Illness:  CEDAR NARDIELLO is a 70 y.o. male follow-up of multivessel coronary disease with recent CABG, cerebrovascular disease with prior stroke, chronic kidney disease, chronic diastolic heart failure, hyperlipidemia, and essential hypertension.   Unable to write and to use his right hand he effectively. 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 This problem only developed after surgery.   CABG 2017, 4:Coronary artery bypass grafting x4 (left internal mammary artery to left anterior descending artery, saphenous vein graft to diagonal, saphenous vein graft to obtuse marginal 1, saphenous vein graft to distal posterior descending). 2. Endoscopic harvest of right leg greater saphenous vein by Dr. Prescott Gum on 07/04/2015.   Past Medical History:  Diagnosis Date  . Chronic kidney disease   . Coronary artery disease   . CVA (cerebral infarction)    08/2011, NO RESIDUAL DEFICITS  . Diabetes mellitus without complication (HCC)    type II  . ED (erectile dysfunction)   . Gout   . History of kidney stones   . Hyperlipidemia   . Hypertension   . Irregular heart beat    "a long time ago"  . Myocardial infarction    1997    Past Surgical History:  Procedure Laterality Date  . CARDIAC CATHETERIZATION N/A 06/20/2015   Procedure: Left Heart Cath and Coronary Angiography;  Surgeon: Belva Crome, MD;  Location: [facility];  Service: Cardiovascular;  Laterality: N/A;  . CARDIAC CATHETERIZATION  1997   angioplasty  . CARDIAC CATHETERIZATION  2010   stents placed  . CATARACT EXTRACTION W/ INTRAOCULAR LENS  IMPLANT, BILATERAL    . COLONOSCOPY    . CORONARY ARTERY  BYPASS GRAFT N/A 07/04/2015   Procedure: CORONARY ARTERY BYPASS GRAFTING (CABG) x 4;  Surgeon: Ivin Poot, MD;  Location: [facility];  Service: Open Heart Surgery;  Laterality: N/A;  . KNEE SURGERY Right    some type of knee cap surgery per pt  . MULTIPLE TOOTH EXTRACTIONS    . TEE WITHOUT CARDIOVERSION N/A 07/04/2015   Procedure: TRANSESOPHAGEAL ECHOCARDIOGRAM (TEE);  Surgeon: Ivin Poot, MD;  Location: [facility];  Service: Open Heart Surgery;  Laterality: N/A;    Current Medications: Outpatient Medications Prior to Visit  Medication Sig Dispense Refill  . acetaminophen (TYLENOL) 500 MG tablet Take 500 mg by mouth daily as needed.    Marland Kitchen aspirin EC 325 MG tablet Take 1 tablet (325 mg total) by mouth daily.    . B Complex-C (B-COMPLEX WITH VITAMIN C) tablet Take 1 tablet by mouth daily.    . Calcium Carb-Cholecalciferol (CALCIUM 600 + D PO) Take 600 mg by mouth daily.    . Cholecalciferol (VITAMIN D3 SUPER STRENGTH PO) Take 1 tablet by mouth daily.    . Coenzyme Q10 (COQ10) 50 MG CAPS Take 50 mg by mouth daily.    . Cyanocobalamin (VITAMIN B-12) 5000 MCG TBDP Take 5,000 mcg by mouth daily.    . Folic Acid 0.8 MG CAPS Take 1 capsule by mouth daily.    . Garlic 123XX123 MG CAPS Take 1,000 mg by mouth daily.    . insulin  aspart (NOVOLOG FLEXPEN) 100 UNIT/ML FlexPen Inject 10 Units into the skin daily with supper. And pen needles 2/day 15 mL 11  . Insulin Glargine (LANTUS SOLOSTAR) 100 UNIT/ML Solostar Pen Inject 30 Units into the skin every morning. 15 mL 11  . Magnesium 250 MG TABS Take 250 mg by mouth daily.    . Omega-3 Fatty Acids (FISH OIL) 1200 MG CAPS Take 1 capsule by mouth daily.    . simvastatin (ZOCOR) 40 MG tablet Take 40 mg by mouth daily.    . Zinc 50 MG CAPS Take 50 mg by mouth daily.     . metoprolol succinate (TOPROL-XL) 25 MG 24 hr tablet Take 1 tablet (25 mg total) by mouth 2 (two) times daily. 180 tablet 3   No facility-administered medications prior to visit.       Allergies:   Contrast media [iodinated diagnostic agents]; Lisinopril; Lipitor [atorvastatin]; Metformin and related; Iodine; and Shellfish allergy   Social History   Social History  . Marital status: Widowed    Spouse name: N/A  . Number of children: N/A  . Years of education: N/A   Social History Main Topics  . Smoking status: Never Smoker  . Smokeless tobacco: Never Used  . Alcohol use No  . Drug use: No  . Sexual activity: Not Asked   Other Topics Concern  . None   Social History Narrative  . None     Family History:  The patient's family history includes Diabetes in his brother.   ROS:   Please see the history of present illness.    Erectile dysfunction, right hand weakness and muscle atrophy. He has been referred to the shoulder/hand clinic. Appointment was ordered then made. All other systems reviewed and are negative.   PHYSICAL EXAM:   VS:  BP 140/76   Pulse 81   Ht 5' 10.5" (1.791 m)   Wt 180 lb 6.4 oz (81.8 kg)   SpO2 98%   BMI 25.52 kg/m    GEN: Well nourished, well developed, in no acute distress  HEENT: normal  Neck: no JVD, carotid bruits, or masses Cardiac: RRR; no murmurs, rubs, or gallops,no edema  Respiratory:  clear to auscultation bilaterally, normal work of breathing GI: soft, nontender, nondistended, + BS MS: no deformity or atrophy  Skin: warm and dry, no rash Neuro:  Alert and Oriented x 3, Strength and sensation are intact Psych: euthymic mood, full affect  Wt Readings from Last 3 Encounters:  11/27/15 180 lb 6.4 oz (81.8 kg)  10/10/15 177 lb (80.3 kg)  09/27/15 174 lb (78.9 kg)      Studies/Labs Reviewed:   EKG:  EKG  none  Recent Labs: 07/05/2015: Magnesium 1.9 07/27/2015: ALT 13; BUN 13; Creat 1.22; Hemoglobin 11.7; Platelets 466; Potassium 4.5; Sodium 140   Lipid Panel    Component Value Date/Time   CHOL 122 (L) 07/27/2015 0929   TRIG 90 07/27/2015 0929   HDL 44 07/27/2015 0929   CHOLHDL 2.8 07/27/2015 0929    VLDL 18 07/27/2015 0929   LDLCALC 60 07/27/2015 0929    Additional studies/ records that were reviewed today include:  Review CT surgery note.    ASSESSMENT:    1. S/P CABG x 4   2. Essential hypertension   3. Other hyperlipidemia   4. Type 2 diabetes mellitus with stage 3 chronic kidney disease, with long-term current use of insulin (HCC)      PLAN:  In order of problems listed above:  1. Doing well without angina. Actively engaged in cardiac rehabilitation without angina. He has concerns about erectile dysfunction and requested possibly using PDE 5 inhibitor therapy. There are no contraindications. 2. Neck currently on antihypertensive therapy. Blood pressure 140/76. His target is less than 140/90 mmHg. Preferably less than [id number] systolic. 3. On statin therapy. No recent lipid panel here. Followed by diabetologist. 4. Followed by Dr. Loanne Drilling.    Medication Adjustments/Labs and Tests Ordered: Current medicines are reviewed at length with the patient today.  Concerns regarding medicines are outlined above.  Medication changes, Labs and Tests ordered today are listed in the Patient Instructions below. There are no Patient Instructions on file for this visit.   Signed, Sinclair Grooms, MD  11/27/2015 8:46 AM    [facility] [address] [place], [place], [place]  [address] Phone: [phone]; Fax: [phone]

## 2015-11-27 NOTE — Patient Instructions (Signed)
Medication Instructions:  None  Labwork: None  Testing/Procedures: None  Follow-Up: Your physician wants you to follow-up in: 6 months with Dr. Smith.  You will receive a reminder letter in the mail two months in advance. If you don't receive a letter, please call our office to schedule the follow-up appointment.   Any Other Special Instructions Will Be Listed Below (If Applicable).     If you need a refill on your cardiac medications before your next appointment, please call your pharmacy.   

## 2015-11-29 ENCOUNTER — Encounter (HOSPITAL_COMMUNITY): Payer: Commercial Managed Care - HMO

## 2015-12-01 ENCOUNTER — Encounter (HOSPITAL_COMMUNITY)
Admission: RE | Admit: 2015-12-01 | Discharge: 2015-12-01 | Disposition: A | Discharge status: Home / Self Care | Payer: Commercial Managed Care - HMO | Source: Ambulatory Visit | Attending: Interventional Cardiology | Admitting: Interventional Cardiology

## 2015-12-01 ENCOUNTER — Encounter (HOSPITAL_COMMUNITY): Payer: Commercial Managed Care - HMO

## 2015-12-01 DIAGNOSIS — Z951 Presence of aortocoronary bypass graft: Secondary | ICD-10-CM | POA: Diagnosis not present

## 2015-12-01 LAB — GLUCOSE, CAPILLARY: Glucose-Capillary: 163 mg/dL — ABNORMAL HIGH (ref 65–99)

## 2015-12-04 ENCOUNTER — Encounter (HOSPITAL_COMMUNITY): Payer: Commercial Managed Care - HMO

## 2015-12-04 ENCOUNTER — Encounter (HOSPITAL_COMMUNITY)
Admission: RE | Admit: 2015-12-04 | Discharge: 2015-12-04 | Disposition: A | Discharge status: Home / Self Care | Payer: Commercial Managed Care - HMO | Source: Ambulatory Visit | Attending: Interventional Cardiology | Admitting: Interventional Cardiology

## 2015-12-04 DIAGNOSIS — Z951 Presence of aortocoronary bypass graft: Secondary | ICD-10-CM | POA: Diagnosis not present

## 2015-12-06 ENCOUNTER — Encounter (HOSPITAL_COMMUNITY): Payer: Commercial Managed Care - HMO

## 2015-12-06 DIAGNOSIS — M79641 Pain in right hand: Secondary | ICD-10-CM | POA: Diagnosis not present

## 2015-12-06 DIAGNOSIS — G5621 Lesion of ulnar nerve, right upper limb: Secondary | ICD-10-CM | POA: Diagnosis not present

## 2015-12-08 ENCOUNTER — Encounter (HOSPITAL_COMMUNITY): Payer: Commercial Managed Care - HMO

## 2015-12-11 ENCOUNTER — Encounter (HOSPITAL_COMMUNITY)
Admission: RE | Admit: 2015-12-11 | Discharge: 2015-12-11 | Disposition: A | Discharge status: Home / Self Care | Payer: Commercial Managed Care - HMO | Source: Ambulatory Visit | Attending: Interventional Cardiology | Admitting: Interventional Cardiology

## 2015-12-11 ENCOUNTER — Encounter (HOSPITAL_COMMUNITY): Payer: Commercial Managed Care - HMO

## 2015-12-11 DIAGNOSIS — Z951 Presence of aortocoronary bypass graft: Secondary | ICD-10-CM | POA: Diagnosis not present

## 2015-12-13 ENCOUNTER — Encounter (HOSPITAL_COMMUNITY): Payer: Commercial Managed Care - HMO

## 2015-12-15 ENCOUNTER — Encounter (HOSPITAL_COMMUNITY): Payer: Commercial Managed Care - HMO

## 2015-12-15 ENCOUNTER — Encounter (HOSPITAL_COMMUNITY)
Admission: RE | Admit: 2015-12-15 | Discharge: 2015-12-15 | Disposition: A | Discharge status: Home / Self Care | Payer: Commercial Managed Care - HMO | Source: Ambulatory Visit | Attending: Interventional Cardiology | Admitting: Interventional Cardiology

## 2015-12-15 DIAGNOSIS — Z951 Presence of aortocoronary bypass graft: Secondary | ICD-10-CM

## 2015-12-18 ENCOUNTER — Encounter (HOSPITAL_COMMUNITY): Payer: Commercial Managed Care - HMO

## 2015-12-18 ENCOUNTER — Encounter (HOSPITAL_COMMUNITY)
Admission: RE | Admit: 2015-12-18 | Discharge: 2015-12-18 | Disposition: A | Discharge status: Home / Self Care | Payer: Commercial Managed Care - HMO | Source: Ambulatory Visit | Attending: Interventional Cardiology | Admitting: Interventional Cardiology

## 2015-12-18 DIAGNOSIS — Z951 Presence of aortocoronary bypass graft: Secondary | ICD-10-CM | POA: Diagnosis not present

## 2015-12-20 ENCOUNTER — Encounter (HOSPITAL_COMMUNITY): Payer: Commercial Managed Care - HMO

## 2015-12-21 NOTE — Progress Notes (Signed)
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  acetaminophen (TYLENOL) 500 MG tablet, Take 500 mg by mouth daily as needed., Disp: , Rfl:  .  aspirin EC 325 MG tablet, Take 1 tablet (325 mg total) by mouth daily., Disp: , Rfl:  .  B Complex-C (B-COMPLEX WITH VITAMIN C) tablet, Take 1 tablet by mouth daily., Disp: , Rfl:  .  Calcium Carb-Cholecalciferol (CALCIUM 600 + D PO), Take 600 mg by mouth daily., Disp: , Rfl:  .  Cholecalciferol (VITAMIN D3 SUPER STRENGTH PO), Take 1 tablet by mouth daily., Disp: , Rfl:  .  Coenzyme Q10 (COQ10) 50 MG CAPS, Take 50 mg by mouth daily., Disp: , Rfl:  .  Cyanocobalamin (VITAMIN B-12) 5000 MCG TBDP, Take 5,000 mcg by mouth daily., Disp: , Rfl:  .  Folic Acid 0.8 MG CAPS, Take 1 capsule by mouth daily., Disp: , Rfl:  .  Garlic 4166 MG CAPS, Take 1,000 mg by mouth daily., Disp: , Rfl:  .  insulin aspart (NOVOLOG FLEXPEN) 100 UNIT/ML FlexPen, Inject 10 Units into the skin daily with supper. And pen needles 2/day, Disp: 15 mL, Rfl: 11 .  Insulin Glargine (LANTUS SOLOSTAR) 100 UNIT/ML Solostar Pen, Inject 30 Units into the skin every morning., Disp: 15 mL, Rfl: 11 .  Magnesium 250 MG TABS, Take 250 mg by mouth daily., Disp: , Rfl:  .  Omega-3 Fatty Acids (FISH OIL) 1200 MG CAPS, Take 1 capsule by mouth daily., Disp: , Rfl:  .  simvastatin (ZOCOR) 40 MG tablet, Take 40 mg by mouth daily., Disp: , Rfl:  .  Zinc 50 MG  CAPS, Take 50 mg by mouth daily. , Disp: , Rfl:   Past Medical History: Past Medical History:  Diagnosis Date  . Chronic kidney disease   . Coronary artery disease   . CVA (cerebral infarction)    08/2011, NO RESIDUAL DEFICITS  . Diabetes mellitus without complication (HCC)    type II  . ED (erectile dysfunction)   . Gout   . History of kidney stones   . Hyperlipidemia   . Hypertension   . Irregular heart beat    "a long time ago"  . Myocardial infarction    1997    Tobacco Use: History  Smoking Status  . Never Smoker  Smokeless Tobacco  . Never Used    Labs: Recent Review Flowsheet Data    Labs for ITP Cardiac and Pulmonary Rehab Latest Ref Rng & Units 07/04/2015 07/04/2015 07/05/2015 07/27/2015 10/10/2015   Cholestrol 125 - 200 mg/dL - - - 122(L) -   LDLCALC <130 mg/dL - - - 60 -   HDL >=40 mg/dL - - - 44 -   Trlycerides <150 mg/dL - - - 90 -   Hemoglobin A1c - - - - - 6.9   PHART 7.350 - 7.450 7.377 - - - -   PCO2ART 35.0 - 45.0 mmHg 41.5 - - - -  HCO3 20.0 - 24.0 mEq/L 24.5(H) - - - -   TCO2 0 - 100 mmol/L 26 26 26  - -   ACIDBASEDEF 0.0 - 2.0 mmol/L 1.0 - - - -   O2SAT % 98.0 - - - -      Capillary Blood Glucose: Lab Results  Component Value Date   GLUCAP 163 (H) 12/01/2015   GLUCAP 130 (H) 11/24/2015   GLUCAP 93 11/24/2015   GLUCAP 328 (H) 11/20/2015   GLUCAP 205 (H) 11/13/2015     Exercise Target Goals:    Exercise Program Goal: Individual exercise prescription set with THRR, safety & activity barriers. Participant demonstrates ability to understand and report RPE using BORG scale, to self-measure pulse accurately, and to acknowledge the importance of the exercise prescription.  Exercise Prescription Goal: Starting with aerobic activity 30 plus minutes a day, 3 days per week for initial exercise prescription. Provide home exercise prescription and guidelines that participant acknowledges understanding prior to discharge.  Activity Barriers & Risk  Stratification:     Activity Barriers & Cardiac Risk Stratification - 09/07/15 0846      Activity Barriers & Cardiac Risk Stratification   Activity Barriers Other (comment)   Comments R arm/shoulder nerve pain   Cardiac Risk Stratification High      6 Minute Walk:     6 Minute Walk    Row Name 09/07/15 1406         6 Minute Walk   Phase Initial     Distance 1602 feet     Walk Time 6 minutes     # of Rest Breaks 0     MPH 3.03     METS 3.8     RPE 13     VO2 Peak 13.4     Symptoms No     Resting HR 80 bpm     Resting BP 142/78     Max Ex. HR 102 bpm     Max Ex. BP 138/64     2 Minute Post BP 134/86        Initial Exercise Prescription:     Initial Exercise Prescription - 09/07/15 1400      Date of Initial Exercise RX and Referring Provider   Date 09/07/15   Referring Provider Daneen Schick     Bike   Level 0.8   Minutes 10   METs 2.78     NuStep   Level 3   Minutes 10   METs 2     Track   Laps 8   Minutes 10   METs 2.74     Prescription Details   Frequency (times per week) 3   Duration Progress to 30 minutes of continuous aerobic without signs/symptoms of physical distress     Intensity   THRR 40-80% of Max Heartrate 60-121   Ratings of Perceived Exertion 11-13   Perceived Dyspnea 0-4     Progression   Progression Continue to progress workloads to maintain intensity without signs/symptoms of physical distress.     Resistance Training   Training Prescription Yes   Weight 2lbs   Reps 10-12      Perform Capillary Blood Glucose checks as needed.  Exercise Prescription Changes:     Exercise Prescription Changes    Row Name 09/28/15 1100 10/18/15 1100 11/10/15 1600 12/11/15 1100       Exercise Review   Progression Yes Yes No No      Response to Exercise   Blood Pressure (Admit)  130/76 140/80 138/80 144/78    Blood Pressure (Exercise) 140/72 130/80 162/80 140/80    Blood Pressure (Exit) 130/80 114/64 118/70 126/78    Heart Rate  (Admit) 84 bpm 91 bpm 98 bpm 96 bpm    Heart Rate (Exercise) 107 bpm 110 bpm 106 bpm 118 bpm    Heart Rate (Exit) 84 bpm 72 bpm 97 bpm 90 bpm    Rating of Perceived Exertion (Exercise) 12 11 11 13     Comments Reviewed HEP on 09/25/15 Reviewed HEP on 09/25/15 Reviewed HEP on 09/25/15 Reviewed HEP on 09/25/15    Duration Progress to 30 minutes of continuous aerobic without signs/symptoms of physical distress Progress to 30 minutes of continuous aerobic without signs/symptoms of physical distress Progress to 30 minutes of continuous aerobic without signs/symptoms of physical distress Progress to 30 minutes of continuous aerobic without signs/symptoms of physical distress    Intensity THRR unchanged THRR unchanged THRR unchanged THRR unchanged      Progression   Progression Continue to progress workloads to maintain intensity without signs/symptoms of physical distress. Continue to progress workloads to maintain intensity without signs/symptoms of physical distress. Continue to progress workloads to maintain intensity without signs/symptoms of physical distress. Continue to progress workloads to maintain intensity without signs/symptoms of physical distress.    Average METs 2.2 2.2 2.5 3      Resistance Training   Training Prescription Yes Yes Yes Yes    Weight 2lbs 3lbs 3lbs 3lbs    Reps 10-12 10-12 10-12 10-12      Bike   Level 0.9 0.9 0.9 0.8    Minutes 10 10 10 10     METs 2.78 2.78 2.78 2.89      NuStep   Level 3 3 4 4     Minutes 10 10 10 10     METs 2 2.7 2.2 3.5      Track   Laps 11 15 15 11     Minutes 10 10 10 10     METs 2.92 3.6 3.6 2.9      Home Exercise Plan   Plans to continue exercise at Home  Reviewed 09/25/15 See progress note Home  Reviewed 09/25/15 See progress note Home  Reviewed 09/25/15 See progress note Home  Reviewed 09/25/15 See progress note    Frequency Add 2 additional days to program exercise sessions. Add 2 additional days to program exercise sessions. Add 2  additional days to program exercise sessions. Add 2 additional days to program exercise sessions.       Exercise Comments:     Exercise Comments    Row Name 09/28/15 1145 10/18/15 1127 11/10/15 1610 12/11/15 1152     Exercise Comments Reviewed METs and goals. Pt initated HEP this week; pt is tolerating exercise well; will continue to monitor exercise progression Reviewed METs and goals. Pt initated HEP this week; pt is tolerating exercise well; will continue to monitor exercise progression Reviewed METs and goals. Pt initated HEP this week; pt is tolerating exercise well; will continue to monitor exercise progression Reviewed METs and goals. Pt is tolerating exercise well; will continue to monitor exercise progression       Discharge Exercise Prescription (Final Exercise Prescription Changes):     Exercise Prescription Changes - 12/11/15 1100      Exercise Review   Progression No     Response to Exercise   Blood Pressure (Admit) 144/78   Blood Pressure (Exercise) 140/80   Blood Pressure (Exit) 126/78   Heart Rate (Admit) 96  bpm   Heart Rate (Exercise) 118 bpm   Heart Rate (Exit) 90 bpm   Rating of Perceived Exertion (Exercise) 13   Comments Reviewed HEP on 09/25/15   Duration Progress to 30 minutes of continuous aerobic without signs/symptoms of physical distress   Intensity THRR unchanged     Progression   Progression Continue to progress workloads to maintain intensity without signs/symptoms of physical distress.   Average METs 3     Resistance Training   Training Prescription Yes   Weight 3lbs   Reps 10-12     Bike   Level 0.8   Minutes 10   METs 2.89     NuStep   Level 4   Minutes 10   METs 3.5     Track   Laps 11   Minutes 10   METs 2.9     Home Exercise Plan   Plans to continue exercise at Home  Reviewed 09/25/15 See progress note   Frequency Add 2 additional days to program exercise sessions.      Nutrition:  Target Goals: Understanding of  nutrition guidelines, daily intake of sodium <1538m, cholesterol <2056m calories 30% from fat and 7% or less from saturated fats, daily to have 5 or more servings of fruits and vegetables.  Biometrics:     Pre Biometrics - 09/07/15 1405      Pre Biometrics   Waist Circumference 38.25 inches   Hip Circumference 36 inches   Waist to Hip Ratio 1.06 %   Triceps Skinfold 15 mm   % Body Fat 25.3 %   Grip Strength 29 kg   Flexibility 11 in   Single Leg Stand 21.31 seconds       Nutrition Therapy Plan and Nutrition Goals:     Nutrition Therapy & Goals - 09/13/15 1023      Nutrition Therapy   Diet Carb Modified, Therapeutic Lifestyle Changes     Personal Nutrition Goals   Personal Goal #1 Improved glycemic control as evidenced by a decrease in A1c from 9.7 toward less than 7.0     Intervention Plan   Intervention Prescribe, educate and counsel regarding individualized specific dietary modifications aiming towards targeted core components such as weight, hypertension, lipid management, diabetes, heart failure and other comorbidities.   Expected Outcomes Short Term Goal: Understand basic principles of dietary content, such as calories, fat, sodium, cholesterol and nutrients.;Long Term Goal: Adherence to prescribed nutrition plan.      Nutrition Discharge: Nutrition Scores:     Nutrition Assessments - 09/29/15 0926      MEDFICTS Scores   Pre Score 35      Nutrition Goals Re-Evaluation:   Psychosocial: Target Goals: Acknowledge presence or absence of depression, maximize coping skills, provide positive support system. Participant is able to verbalize types and ability to use techniques and skills needed for reducing stress and depression.  Initial Review & Psychosocial Screening:     Initial Psych Review & Screening - 09/08/15 [place]      Quality of Life Scores:     Quality of Life - 10/11/15 1732      Quality of  Life Scores   GLOBAL Pre --  pt has overall good qol life scores, pt exhibits positve outlook and good coping skills.       PHQ-9: Recent Review Flowsheet Data    [facility] 09/[facility]2015   Decreased Interest 0  Down, Depressed, Hopeless 0   PHQ - 2 Score 0      Psychosocial Evaluation and Intervention:     Psychosocial Evaluation - 12/21/15 1339      Psychosocial Evaluation & Interventions   Interventions Encouraged to exercise with the program and follow exercise prescription   Comments Pt with adequate support system.  Pt has returned back to work with no difficulites. No further psychosocial needed identified, no further intervention warranted.   Continued Psychosocial Services Needed No     Discharge Psychosocial Assessment & Intervention   Discharge Continue support measures as needed      Psychosocial Re-Evaluation:     Psychosocial Re-Evaluation    [place] Name 11/23/15 1248 12/21/15 1339           Psychosocial Re-Evaluation   Interventions Encouraged to attend Cardiac Rehabilitation for the exercise Encouraged to attend Cardiac Rehabilitation for the exercise         Vocational Rehabilitation: Provide vocational rehab assistance to qualifying candidates.   Vocational Rehab Evaluation & Intervention:     Vocational Rehab - 09/08/15 1024      Initial Vocational Rehab Evaluation & Intervention   Assessment shows need for Vocational Rehabilitation No  Pt feels able to return back to his job without any difficulty.        Education: Education Goals: Education classes will be provided on a weekly basis, covering required topics. Participant will state understanding/return demonstration of topics presented.  Learning Barriers/Preferences:     Learning Barriers/Preferences - 09/07/15 0846      Learning Barriers/Preferences   Learning Barriers Sight   Learning Preferences Written Material      Education Topics: Count Your Pulse:   -Group instruction provided by verbal instruction, demonstration, patient participation and written materials to support subject.  Instructors address importance of being able to find your pulse and how to count your pulse when at home without a heart monitor.  Patients get hands on experience counting their pulse with staff help and individually.   Heart Attack, Angina, and Risk Factor Modification:  -Group instruction provided by verbal instruction, video, and written materials to support subject.  Instructors address signs and symptoms of angina and heart attacks.    Also discuss risk factors for heart disease and how to make changes to improve heart health risk factors.   Functional Fitness:  -Group instruction provided by verbal instruction, demonstration, patient participation, and written materials to support subject.  Instructors address safety measures for doing things around the house.  Discuss how to get up and down off the floor, how to pick things up properly, how to safely get out of a chair without assistance, and balance training. Flowsheet Row CARDIAC REHAB PHASE II EXERCISE from 11/10/2015 in [place]  Date  09/29/15  Instruction Review Code  2- meets goals/outcomes      Meditation and Mindfulness:  -Group instruction provided by verbal instruction, patient participation, and written materials to support subject.  Instructor addresses importance of mindfulness and meditation practice to help reduce stress and improve awareness.  Instructor also leads participants through a meditation exercise.    Stretching for Flexibility and Mobility:  -Group instruction provided by verbal instruction, patient participation, and written materials to support subject.  Instructors lead participants through series of stretches that are designed to increase flexibility thus improving mobility.  These stretches are additional exercise for major muscle groups that  are typically performed during regular warm up and cool down.  Flowsheet Row CARDIAC REHAB PHASE II EXERCISE from 11/10/2015 in [place]  Date  11/10/15  Instruction Review Code  2- meets goals/outcomes      Hands Only CPR Anytime:  -Group instruction provided by verbal instruction, video, patient participation and written materials to support subject.  Instructors co-teach with [facility] video for hands only CPR.  Participants get hands on experience with mannequins.   Nutrition I class: Heart Healthy Eating:  -Group instruction provided by PowerPoint slides, verbal discussion, and written materials to support subject matter. The instructor gives an explanation and review of the Therapeutic Lifestyle Changes diet recommendations, which includes a discussion on lipid goals, dietary fat, sodium, fiber, plant stanol/sterol esters, sugar, and the components of a well-balanced, healthy diet. Flowsheet Row CARDIAC REHAB PHASE II EXERCISE from 11/10/2015 in [place]  Date  09/29/15  Educator  RD  Instruction Review Code  Not applicable [class handouts given]      Nutrition II class: Lifestyle Skills:  -Group instruction provided by PowerPoint slides, verbal discussion, and written materials to support subject matter. The instructor gives an explanation and review of label reading, grocery shopping for heart health, heart healthy recipe modifications, and ways to make healthier choices when eating out. Flowsheet Row CARDIAC REHAB PHASE II EXERCISE from 11/10/2015 in [place]  Date  09/29/15  Educator  RD  Instruction Review Code  Not applicable [class handouts given]      Diabetes Question & Answer:  -Group instruction provided by PowerPoint slides, verbal discussion, and written materials to support subject matter. The instructor gives an explanation and review of diabetes co-morbidities, pre- and  post-prandial blood glucose goals, pre-exercise blood glucose goals, signs, symptoms, and treatment of hypoglycemia and hyperglycemia, and foot care basics. Flowsheet Row CARDIAC REHAB PHASE II EXERCISE from 11/10/2015 in [place]  Date  10/27/15  Educator  RD  Instruction Review Code  2- meets goals/outcomes      Diabetes Blitz:  -Group instruction provided by PowerPoint slides, verbal discussion, and written materials to support subject matter. The instructor gives an explanation and review of the physiology behind type 1 and type 2 diabetes, diabetes medications and rational behind using different medications, pre- and post-prandial blood glucose recommendations and Hemoglobin A1c goals, diabetes diet, and exercise including blood glucose guidelines for exercising safely.  Flowsheet Row CARDIAC REHAB PHASE II EXERCISE from 11/10/2015 in [place]  Date  09/29/15  Educator  RD  Instruction Review Code  Not applicable [class handouts given]      Portion Distortion:  -Group instruction provided by PowerPoint slides, verbal discussion, written materials, and food models to support subject matter. The instructor gives an explanation of serving size versus portion size, changes in portions sizes over the last 20 years, and what consists of a serving from each food group.   Stress Management:  -Group instruction provided by verbal instruction, video, and written materials to support subject matter.  Instructors review role of stress in heart disease and how to cope with stress positively.     Exercising on Your Own:  -Group instruction provided by verbal instruction, power point, and written materials to support subject.  Instructors discuss benefits of exercise, components of exercise, frequency and intensity of exercise, and end points for exercise.  Also discuss use of nitroglycerin and activating EMS.  Review options of  places to exercise outside of rehab.  Review guidelines for sex with heart disease.   Cardiac Drugs I:  -Group instruction provided by verbal instruction and written materials to support subject.  Instructor reviews cardiac drug classes: antiplatelets, anticoagulants, beta blockers, and statins.  Instructor discusses reasons, side effects, and lifestyle considerations for each drug class.   Cardiac Drugs II:  -Group instruction provided by verbal instruction and written materials to support subject.  Instructor reviews cardiac drug classes: angiotensin converting enzyme inhibitors (ACE-I), angiotensin II receptor blockers (ARBs), nitrates, and calcium channel blockers.  Instructor discusses reasons, side effects, and lifestyle considerations for each drug class.   Anatomy and Physiology of the Circulatory System:  -Group instruction provided by verbal instruction, video, and written materials to support subject.  Reviews functional anatomy of heart, how it relates to various diagnoses, and what role the heart plays in the overall system. Flowsheet Row CARDIAC REHAB PHASE II EXERCISE from 11/10/2015 in [place]  Date  09/20/15  Instruction Review Code  2- meets goals/outcomes      Knowledge Questionnaire Score:     Knowledge Questionnaire Score - 09/13/15 1024      Knowledge Questionnaire Score   Pre Score 23/28     DM 14/15      Core Components/Risk Factors/Patient Goals at Admission:     Personal Goals and Risk Factors at Admission - 09/07/15 0849      Core Components/Risk Factors/Patient Goals on Admission   Personal Goal Other Yes   Personal Goal short: be able to pick up more than 10lbs, long be able to cut grass and do house work   Intervention Provide exercise programming to assist with improving strength and cardiovascular fitness   Expected Outcomes pt will be able to lift more than 10lbs, cut grass and do house work      Core  Components/Risk Factors/Patient Goals Review:      Goals and Risk Factor Review    Row Name 09/28/15 1146 09/29/15 1110 10/16/15 0939 11/20/15 1144 12/11/15 1153     Core Components/Risk Factors/Patient Goals Review   Personal Goals Review Other;Increase Strength and Stamina  - Increase Strength and Stamina Increase Strength and Stamina Increase Strength and Stamina   Review Initated HEP and pt will walk at home and go to gym 2x/week Pt returns to work today and feels as though he is getting stronger Pt is able to lift 10lbs without difficulty. Able to perform household chores with less fatigue and "feels great everyday when he wakes up.' Pt is able to lift 20lbs without difficulty and Dr. approval. Able to perform household chores with less fatigue and "feels great everyday when he wakes up.'  Goes to [facility] on off days from [facility] pt states he feels great and feels his strength is improving.  He goes to the [facility] on off days from [facility] (2-3 days/wk)   Expected Outcomes Pt will increase energy levels and exercise capacity Pt will increase energy levels and exercise capacity Pt will continue to get stronger and perform ADL's with little fatigue. Pt will continue to get stronger and perform ADL's with little fatigue. Pt will continue to get stronger and perform ADL's with little fatigue.      Core Components/Risk Factors/Patient Goals at Discharge (Final Review):      Goals and Risk Factor Review - 12/11/15 1153      Core Components/Risk Factors/Patient Goals Review   Personal Goals Review Increase Strength and Stamina   Review pt states he feels great  and feels his strength is improving.  He goes to the [facility] on off days from [facility] (2-3 days/wk)   Expected Outcomes Pt will continue to get stronger and perform ADL's with little fatigue.      ITP Comments:     ITP Comments    Row Name 09/07/15 0844 10/20/15 1346         ITP Comments Dr. Fransico Him, Medical Director Patient attended the  "Taking the high out of high blood pressure" video/lecture education class on 10/20/15. Met outcomes/goals.         Comments:  Pt is making expected progress toward personal goals after completing 23 sessions. Pt able to attend cardiac rehab x 2 week and work.Repeat Psychosocial Assessment: Pt with supportive family, denies any Psychosocial needs or interventions at this time. . Recommend continued exercise and life style modification education including  stress management and relaxation techniques to decrease cardiac risk profile. Cherre Huger, BSN

## 2015-12-22 ENCOUNTER — Encounter (HOSPITAL_COMMUNITY)
Admission: RE | Admit: 2015-12-22 | Discharge: 2015-12-22 | Disposition: A | Discharge status: Home / Self Care | Payer: Commercial Managed Care - HMO | Source: Ambulatory Visit | Attending: Interventional Cardiology | Admitting: Interventional Cardiology

## 2015-12-22 ENCOUNTER — Encounter (HOSPITAL_COMMUNITY): Payer: Commercial Managed Care - HMO

## 2015-12-22 DIAGNOSIS — Z951 Presence of aortocoronary bypass graft: Secondary | ICD-10-CM

## 2015-12-25 ENCOUNTER — Encounter (HOSPITAL_COMMUNITY)
Admission: RE | Admit: 2015-12-25 | Discharge: 2015-12-25 | Disposition: A | Discharge status: Home / Self Care | Payer: Commercial Managed Care - HMO | Source: Ambulatory Visit | Attending: Interventional Cardiology | Admitting: Interventional Cardiology

## 2015-12-25 ENCOUNTER — Encounter (HOSPITAL_COMMUNITY): Payer: Commercial Managed Care - HMO

## 2015-12-25 VITALS — Ht 70.5 in | Wt 176.4 lb

## 2015-12-25 DIAGNOSIS — Z951 Presence of aortocoronary bypass graft: Secondary | ICD-10-CM

## 2015-12-27 ENCOUNTER — Encounter (HOSPITAL_COMMUNITY): Payer: Commercial Managed Care - HMO

## 2015-12-29 ENCOUNTER — Encounter (HOSPITAL_COMMUNITY)
Admission: RE | Admit: 2015-12-29 | Discharge: 2015-12-29 | Disposition: A | Discharge status: Home / Self Care | Payer: Commercial Managed Care - HMO | Source: Ambulatory Visit | Attending: Interventional Cardiology | Admitting: Interventional Cardiology

## 2015-12-29 ENCOUNTER — Encounter (HOSPITAL_COMMUNITY): Payer: Commercial Managed Care - HMO

## 2015-12-29 DIAGNOSIS — Z951 Presence of aortocoronary bypass graft: Secondary | ICD-10-CM | POA: Diagnosis not present

## 2016-01-01 ENCOUNTER — Encounter (HOSPITAL_COMMUNITY)
Admission: RE | Admit: 2016-01-01 | Discharge: 2016-01-01 | Disposition: A | Discharge status: Home / Self Care | Payer: Commercial Managed Care - HMO | Source: Ambulatory Visit | Attending: Interventional Cardiology | Admitting: Interventional Cardiology

## 2016-01-01 ENCOUNTER — Encounter (HOSPITAL_COMMUNITY): Payer: Commercial Managed Care - HMO

## 2016-01-01 DIAGNOSIS — Z951 Presence of aortocoronary bypass graft: Secondary | ICD-10-CM | POA: Diagnosis not present

## 2016-01-03 ENCOUNTER — Encounter (HOSPITAL_COMMUNITY): Payer: Commercial Managed Care - HMO

## 2016-01-05 ENCOUNTER — Encounter (HOSPITAL_COMMUNITY): Payer: Commercial Managed Care - HMO

## 2016-01-05 ENCOUNTER — Encounter (HOSPITAL_COMMUNITY)
Admission: RE | Admit: 2016-01-05 | Discharge: 2016-01-05 | Disposition: A | Discharge status: Home / Self Care | Payer: Commercial Managed Care - HMO | Source: Ambulatory Visit | Attending: Interventional Cardiology | Admitting: Interventional Cardiology

## 2016-01-05 DIAGNOSIS — Z951 Presence of aortocoronary bypass graft: Secondary | ICD-10-CM | POA: Diagnosis not present

## 2016-01-05 NOTE — Progress Notes (Signed)
Discharge Summary  Patient Details  Name: Joseph Sanford MRN: [id number] Date of Birth: 03-Oct-1945 Referring Provider:   Flowsheet Row CARDIAC REHAB PHASE II ORIENTATION from 09/07/2015 in [facility]  Referring Provider  Daneen Schick       Number of Visits: 28  Reason for Discharge:  Patient reached a stable level of exercise. Patient independent in their exercise.  Smoking History:  History  Smoking Status  . Never Smoker  Smokeless Tobacco  . 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  ADL UCSD:   Initial Exercise Prescription:     Initial Exercise Prescription - 09/07/15 1400      Date of Initial Exercise RX and Referring Provider   Date 09/07/15   Referring Provider Daneen Schick     Bike   Level 0.8   Minutes 10   METs 2.78     NuStep   Level 3   Minutes 10   METs 2     Track   Laps 8   Minutes 10   METs 2.74     Prescription Details   Frequency (times per week) 3   Duration Progress to 30 minutes of continuous aerobic without signs/symptoms of physical distress     Intensity   THRR 40-80% of Max Heartrate 60-121   Ratings of Perceived Exertion 11-13   Perceived Dyspnea 0-4     Progression   Progression Continue to progress workloads to maintain intensity without signs/symptoms of physical distress.     Resistance Training   Training Prescription Yes   Weight 2lbs   Reps 10-12      Discharge Exercise Prescription (Final Exercise Prescription Changes):     Exercise Prescription Changes - 01/04/16 1400      Exercise Review   Progression No     Response to Exercise   Blood Pressure (Admit) 146/80   Blood Pressure (Exercise) 146/70   Blood Pressure (Exit) 124/70   Heart Rate (Admit) 101 bpm   Heart Rate (Exercise) 123 bpm   Heart Rate (Exit) 98 bpm   Rating of Perceived Exertion (Exercise) 11   Comments Reviewed HEP on 09/25/15   Duration Progress to 30 minutes of continuous aerobic without  signs/symptoms of physical distress   Intensity THRR unchanged     Progression   Progression Continue to progress workloads to maintain intensity without signs/symptoms of physical distress.   Average METs 2.9     Resistance Training   Training Prescription Yes   Weight 4lbs   Reps 10-12     Bike   Level 0.8   Minutes 10   METs 2.89     NuStep   Level 4   Minutes 10   METs 2.6     Track   Laps 13   Minutes 10   METs 3.3     Home Exercise Plan   Plans to continue exercise at Home  Reviewed 09/25/15 See progress note   Frequency Add 2 additional days to program exercise sessions.      Functional Capacity:     6 Minute Walk    Row Name 09/07/15 1406         6 Minute Walk   Phase Initial     Distance 1602 feet     Walk Time 6 minutes     # of Rest Breaks 0     MPH 3.03     METS 3.8  RPE 13     VO2 Peak 13.4     Symptoms No     Resting HR 80 bpm     Resting BP 142/78     Max Ex. HR 102 bpm     Max Ex. BP 138/64     2 Minute Post BP 134/86        Psychological, QOL, Others - Outcomes: PHQ 2/9: Depression screen [facility] 2/9 01/05/2016 09/11/2015  Decreased Interest 0 0  Down, Depressed, Hopeless 0 0  PHQ - 2 Score 0 0    Quality of Life:     Quality of Life - 10/11/15 1732      Quality of Life Scores   GLOBAL Pre --  pt has overall good qol life scores, pt exhibits positve outlook and good coping skills.       Personal Goals: Goals established at orientation with interventions provided to work toward goal.     Personal Goals and Risk Factors at Admission - 09/07/15 0849      Core Components/Risk Factors/Patient Goals on Admission   Personal Goal Other Yes   Personal Goal short: be able to pick up more than 10lbs, long be able to cut grass and do house work   Intervention Provide exercise programming to assist with improving strength and cardiovascular fitness   Expected Outcomes pt will be able to lift more than 10lbs, cut grass and do  house work       Personal Goals Discharge:     Goals and Risk [place] Name 09/28/15 1146 09/29/15 1110 10/16/15 0939 11/20/15 1144 12/11/15 1153     Core Components/Risk Factors/Patient Goals Review   Personal Goals Review Other;Increase Strength and Stamina  - Increase Strength and Stamina Increase Strength and Stamina Increase Strength and Stamina   Review Initated HEP and pt will walk at home and go to gym 2x/week Pt returns to work today and feels as though he is getting stronger Pt is able to lift 10lbs without difficulty. Able to perform household chores with less fatigue and "feels great everyday when he wakes up.' Pt is able to lift 20lbs without difficulty and Dr. approval. Able to perform household chores with less fatigue and "feels great everyday when he wakes up.'  Goes to [facility] on off days from [facility] pt states he feels great and feels his strength is improving.  He goes to the [facility] on off days from [facility] (2-3 days/wk)   Expected Outcomes Pt will increase energy levels and exercise capacity Pt will increase energy levels and exercise capacity Pt will continue to get stronger and perform ADL's with little fatigue. Pt will continue to get stronger and perform ADL's with little fatigue. Pt will continue to get stronger and perform ADL's with little fatigue.   [place] Name 01/04/16 1444             Core Components/Risk Factors/Patient Goals Review   Personal Goals Review Increase Strength and Stamina       Review Pt is graduating this week and plans to attend the [employer] 4-5 days/week.  Pt states hisi strength and stamina have improved overall       Expected Outcomes Pt will continue to get stronger and perform ADL's with little fatigue.          Nutrition & Weight - Outcomes:     Pre Biometrics - 09/07/15 1405      Pre Biometrics   Waist Circumference 38.25 inches  Hip Circumference 36 inches   Waist to Hip Ratio 1.06 %   Triceps Skinfold 15 mm   % Body Fat 25.3 %    Grip Strength 29 kg   Flexibility 11 in   Single Leg Stand 21.31 seconds       Nutrition:     Nutrition Therapy & Goals - 09/13/15 1023      Nutrition Therapy   Diet Carb Modified, Therapeutic Lifestyle Changes     Personal Nutrition Goals   Personal Goal #1 Improved glycemic control as evidenced by a decrease in A1c from 9.7 toward less than 7.0     Intervention Plan   Intervention Prescribe, educate and counsel regarding individualized specific dietary modifications aiming towards targeted core components such as weight, hypertension, lipid management, diabetes, heart failure and other comorbidities.   Expected Outcomes Short Term Goal: Understand basic principles of dietary content, such as calories, fat, sodium, cholesterol and nutrients.;Long Term Goal: Adherence to prescribed nutrition plan.      Nutrition Discharge:     Nutrition Assessments - 09/29/15 0926      MEDFICTS Scores   Pre Score 35      Education Questionnaire Score:     Knowledge Questionnaire Score - 09/13/15 1024      Knowledge Questionnaire Score   Pre Score 23/28     DM 14/15      Goals reviewed with patient Pt graduated from cardiac rehab program today with completion of 28 exercise sessions in Phase II during a 18 week period per medicare reimbursement.  Pt maintained good attendance two times a week due to his work schedule and progressed nicely during his participation in rehab as evidenced by increased MET level.   Medication list reconciled. Repeat  PHQ score-0. Pt completed post QOL survey.  Pt scored the following     Quality of Life - 01/18/16 1509      Quality of Life Scores   Health/Function Pre 25.27 %   Health/Function Post 25.73 %   Health/Function % Change 1.82 %   Socioeconomic Pre 29 %   Socioeconomic Post 26.56 %   Socioeconomic % Change  -8.41 %   Psych/Spiritual Pre 28.93 %   Psych/Spiritual Post 29 %   Psych/Spiritual % Change 0.24 %   Family Pre 28.8 %    Family Post 23 %   Family % Change -20.14 %   GLOBAL Pre 27.26 %   GLOBAL Post 26.23 %   GLOBAL % Change -3.78 %        Pt demonstrates positive and healthy coping skills.   Pt has made significant lifestyle changes and should be commended for his success. Pt feels he has achieved his goals during cardiac rehab.  Pt is able to lift 10 pounds and has returned back to doing yardwork and housework. Pt plans to continue exercise at the [facility] 3-5 times a week.  Pt encouraged to do some type of exercise 7 days a week due to his risk factor of diabetes.  It is indeed a true delight to have this patient participate in cardiac rehab. Cherre Huger, BSN

## 2016-01-18 NOTE — Addendum Note (Signed)
Encounter addended by: Meta Hatchet on: 01/18/2016  3:14 PM<BR>    Actions taken: Flowsheet accepted, Flowsheet data copied forward, Visit Navigator Flowsheet section accepted

## 2016-01-22 ENCOUNTER — Encounter (HOSPITAL_COMMUNITY): Payer: Self-pay | Admitting: *Deleted

## 2016-02-03 NOTE — Progress Notes (Deleted)
   Subjective:    Patient ID: Joseph Sanford, male    DOB: 02-Jan-1946, 71 y.o.   MRN: [id number]  HPI Pt returns for f/u of diabetes mellitus: DM type: Insulin-requiring type 2 Dx'ed: 0000000 Complications: polyneuropathy, CAD, retinopathy, TIA, and renal insufficiency Therapy: insulin since 2000 DKA: never Severe hypoglycemia: never Pancreatitis: never.  Other: he stopped using V-GO, due to cost; he stopped metformin, due to diarrhea; he takes BID insulin, after poor results with multiple daily injections.   Interval history: He skips the insulin approx twice per week, due to mild fasting hypoglycemia.  He has lost more weight.  He takes lantus 40/d, and humalog 10/d (supper).  Meter is downloaded today, and the printout is scanned into the record  It varies from 60-180.  Almost all are checked fasting.    Review of Systems     Objective:   Physical Exam VITAL SIGNS:  See vs page GENERAL: no distress Pulses: dorsalis pedis intact bilat.   MSK: no deformity of the feet CV: no leg edema Skin:  no ulcer on the feet.  normal color and temp on the feet. Neuro: sensation is intact to touch on the feet, but decreased from normal.        Assessment & Plan:  Insulin-requiring type 2 DM: slightly overcontrolled.    Hypoglycemia, new.  CAD: in this setting, he needs to avoid hypoglycemia.

## 2016-02-06 ENCOUNTER — Ambulatory Visit: Payer: Commercial Managed Care - HMO | Admitting: Endocrinology

## 2016-02-20 ENCOUNTER — Ambulatory Visit (INDEPENDENT_AMBULATORY_CARE_PROVIDER_SITE_OTHER): Payer: Commercial Managed Care - HMO | Admitting: Endocrinology

## 2016-02-20 ENCOUNTER — Encounter: Payer: Self-pay | Admitting: Endocrinology

## 2016-02-20 VITALS — BP 146/94 | HR 79 | Ht 70.5 in | Wt 176.0 lb

## 2016-02-20 DIAGNOSIS — Z794 Long term (current) use of insulin: Secondary | ICD-10-CM | POA: Diagnosis not present

## 2016-02-20 DIAGNOSIS — N183 Chronic kidney disease, stage 3 unspecified: Secondary | ICD-10-CM

## 2016-02-20 DIAGNOSIS — E1122 Type 2 diabetes mellitus with diabetic chronic kidney disease: Secondary | ICD-10-CM

## 2016-02-20 MED ORDER — INSULIN NPH (HUMAN) (ISOPHANE) 100 UNIT/ML ~~LOC~~ SUSP: 35.0000 [IU] | SUBCUTANEOUS | 11 refills | Status: DC

## 2016-02-20 NOTE — Patient Instructions (Addendum)
Please change both insulins to "NPH," 35 units each morning.  I have sent a prescription to your pharmacy On this type of insulin schedule, you should eat meals on a regular schedule, especially lunch.  If a meal is missed or significantly delayed, your blood sugar could go low.  Therefore, you should carry a non-perishable snack, such as crackers, while at work.  check your blood sugar twice a day.  vary the time of day when you check, between before the 3 meals, and at bedtime.  also check if you have symptoms of your blood sugar being too high or too low.  please keep a record of the readings and bring it to your next appointment here (or you can bring the meter itself).  You can write it on any piece of paper.  please call us sooner if your blood sugar goes below 70, or if you have a lot of readings over 200.   Please come back for a follow-up appointment in 2-3 weeks.

## 2016-02-20 NOTE — Progress Notes (Signed)
Subjective:    Patient ID: Joseph Sanford, male    DOB: 1945-11-26, 71 y.o.   MRN: [id number]  HPI Pt returns for f/u of diabetes mellitus: DM type: Insulin-requiring type 2 Dx'ed: 0000000 Complications: polyneuropathy, CAD, retinopathy, TIA, and renal insufficiency.   Therapy: insulin since 2000 DKA: never Severe hypoglycemia: never Pancreatitis: never.  Other: he stopped using V-GO, due to cost; he stopped metformin, due to diarrhea; he takes BID insulin, after poor results with multiple daily injections.   Interval history: He has been missing his insulin, due to cost.  pt states he feels well in general.    Past Medical History:  Diagnosis Date  . Chronic kidney disease   . Coronary artery disease   . CVA (cerebral infarction)    08/2011, NO RESIDUAL DEFICITS  . Diabetes mellitus without complication (HCC)    type II  . ED (erectile dysfunction)   . Gout   . History of kidney stones   . Hyperlipidemia   . Hypertension   . Irregular heart beat    "a long time ago"  . Myocardial infarction    1997    Past Surgical History:  Procedure Laterality Date  . CARDIAC CATHETERIZATION N/A 06/20/2015   Procedure: Left Heart Cath and Coronary Angiography;  Surgeon: Belva Crome, MD;  Location: [facility];  Service: Cardiovascular;  Laterality: N/A;  . CARDIAC CATHETERIZATION  1997   angioplasty  . CARDIAC CATHETERIZATION  2010   stents placed  . CATARACT EXTRACTION W/ INTRAOCULAR LENS  IMPLANT, BILATERAL    . COLONOSCOPY    . CORONARY ARTERY BYPASS GRAFT N/A 07/04/2015   Procedure: CORONARY ARTERY BYPASS GRAFTING (CABG) x 4;  Surgeon: Ivin Poot, MD;  Location: [facility];  Service: Open Heart Surgery;  Laterality: N/A;  . KNEE SURGERY Right    some type of knee cap surgery per pt  . MULTIPLE TOOTH EXTRACTIONS    . TEE WITHOUT CARDIOVERSION N/A 07/04/2015   Procedure: TRANSESOPHAGEAL ECHOCARDIOGRAM (TEE);  Surgeon: Ivin Poot, MD;  Location: [facility];  Service: Open Heart  Surgery;  Laterality: N/A;    Social History   Social History  . Marital status: Widowed    Spouse name: N/A  . Number of children: N/A  . Years of education: N/A   Occupational History  . Not on file.   Social History Main Topics  . Smoking status: Never Smoker  . Smokeless tobacco: Never Used  . Alcohol use No  . Drug use: No  . Sexual activity: Not on file   Other Topics Concern  . Not on file   Social History Narrative  . No narrative on file    Current Outpatient Prescriptions on File Prior to Visit  Medication Sig Dispense Refill  . acetaminophen (TYLENOL) 500 MG tablet Take 500 mg by mouth daily as needed.    Marland Kitchen aspirin EC 325 MG tablet Take 1 tablet (325 mg total) by mouth daily.    . B Complex-C (B-COMPLEX WITH VITAMIN C) tablet Take 1 tablet by mouth daily.    . Calcium Carb-Cholecalciferol (CALCIUM 600 + D PO) Take 600 mg by mouth daily.    . Cholecalciferol (VITAMIN D3 SUPER STRENGTH PO) Take 1 tablet by mouth daily.    . Coenzyme Q10 (COQ10) 50 MG CAPS Take 50 mg by mouth daily.    . Cyanocobalamin (VITAMIN B-12) 5000 MCG TBDP Take 5,000 mcg by mouth daily.    . Folic Acid  0.8 MG CAPS Take 1 capsule by mouth daily.    . Garlic 123XX123 MG CAPS Take 1,000 mg by mouth daily.    . Magnesium 250 MG TABS Take 250 mg by mouth daily.    . Omega-3 Fatty Acids (FISH OIL) 1200 MG CAPS Take 1 capsule by mouth daily.    . simvastatin (ZOCOR) 40 MG tablet Take 40 mg by mouth daily.    . Zinc 50 MG CAPS Take 50 mg by mouth daily.      No current facility-administered medications on file prior to visit.     Allergies  Allergen Reactions  . Contrast Media [Iodinated Diagnostic Agents] Other (See Comments)    "bumps on arm"  . Lisinopril Other (See Comments)    cough  . Lipitor [Atorvastatin] Other (See Comments)    Brain fog  . Metformin And Related Other (See Comments)    "STOMACH UPSET"  . Iodine Nausea And Vomiting  . Shellfish Allergy Nausea And Vomiting     Reaction to shrimp    Family History  Problem Relation Age of Onset  . Diabetes Brother     BP (!) 146/94   Pulse 79   Ht 5' 10.5" (1.791 m)   Wt 176 lb (79.8 kg)   SpO2 96%   BMI 24.90 kg/m    Review of Systems He denies hypoglycemia.    Objective:   Physical Exam VITAL SIGNS:  See vs page GENERAL: no distress Pulses: dorsalis pedis intact bilat.   MSK: no deformity of the feet CV: no leg edema Skin:  no ulcer on the feet.  normal color and temp on the feet. Neuro: sensation is intact to touch on the feet, but decreased from normal.    A1c=12.1%    Assessment & Plan:  Insulin-requiring type 2 DM, with CAD, worse.  Noncompliance with cbg recording and insulin: this vastly complicates the rx of DM.  Renal insufficiency: in this setting, he prob just needs NPH QAM.   Patient is advised the following: Patient Instructions  Please change both insulins to "NPH," 35 units each morning.  I have sent a prescription to your pharmacy On this type of insulin schedule, you should eat meals on a regular schedule, especially lunch.  If a meal is missed or significantly delayed, your blood sugar could go low.  Therefore, you should carry a non-perishable snack, such as crackers, while at work.  check your blood sugar twice a day.  vary the time of day when you check, between before the 3 meals, and at bedtime.  also check if you have symptoms of your blood sugar being too high or too low.  please keep a record of the readings and bring it to your next appointment here (or you can bring the meter itself).  You can write it on any piece of paper.  please call us sooner if your blood sugar goes below 70, or if you have a lot of readings over 200.   Please come back for a follow-up appointment in 2-3 weeks.

## 2016-03-25 DIAGNOSIS — Z1159 Encounter for screening for other viral diseases: Secondary | ICD-10-CM | POA: Diagnosis not present

## 2016-03-25 DIAGNOSIS — I1310 Hypertensive heart and chronic kidney disease without heart failure, with stage 1 through stage 4 chronic kidney disease, or unspecified chronic kidney disease: Secondary | ICD-10-CM | POA: Diagnosis not present

## 2016-03-25 DIAGNOSIS — N183 Chronic kidney disease, stage 3 (moderate): Secondary | ICD-10-CM | POA: Diagnosis not present

## 2016-03-25 DIAGNOSIS — I25119 Atherosclerotic heart disease of native coronary artery with unspecified angina pectoris: Secondary | ICD-10-CM | POA: Diagnosis not present

## 2016-03-25 DIAGNOSIS — Z0000 Encounter for general adult medical examination without abnormal findings: Secondary | ICD-10-CM | POA: Diagnosis not present

## 2016-03-25 DIAGNOSIS — E1121 Type 2 diabetes mellitus with diabetic nephropathy: Secondary | ICD-10-CM | POA: Diagnosis not present

## 2016-03-25 DIAGNOSIS — Z125 Encounter for screening for malignant neoplasm of prostate: Secondary | ICD-10-CM | POA: Diagnosis not present

## 2016-03-25 DIAGNOSIS — E782 Mixed hyperlipidemia: Secondary | ICD-10-CM | POA: Diagnosis not present

## 2016-03-29 ENCOUNTER — Ambulatory Visit: Payer: Commercial Managed Care - HMO | Admitting: Endocrinology

## 2016-03-29 ENCOUNTER — Encounter: Payer: Commercial Managed Care - HMO | Admitting: Dietician

## 2016-04-01 ENCOUNTER — Telehealth: Payer: Self-pay | Admitting: Endocrinology

## 2016-04-01 NOTE — Telephone Encounter (Signed)
Patient no showed today's appt. Please advise on how to follow up. °A. No follow up necessary. °B. Follow up urgent. Contact patient immediately. °C. Follow up necessary. Contact patient and schedule visit in ___ days. °D. Follow up advised. Contact patient and schedule visit in ____weeks. ° °

## 2016-04-01 NOTE — Telephone Encounter (Signed)
Please come back for a follow-up appointment in 1 month.  

## 2016-04-01 NOTE — Telephone Encounter (Signed)
Called patient to schedule, mailbox is full.

## 2016-04-19 ENCOUNTER — Encounter: Payer: Self-pay | Admitting: *Deleted

## 2016-04-23 ENCOUNTER — Encounter: Payer: Commercial Managed Care - HMO | Admitting: Dietician

## 2016-06-06 ENCOUNTER — Encounter: Payer: Commercial Managed Care - HMO | Admitting: Dietician

## 2016-07-10 DIAGNOSIS — E782 Mixed hyperlipidemia: Secondary | ICD-10-CM | POA: Diagnosis not present

## 2016-07-11 ENCOUNTER — Encounter: Payer: Medicare HMO | Attending: Endocrinology | Admitting: Dietician

## 2016-07-11 ENCOUNTER — Encounter: Payer: Self-pay | Admitting: Dietician

## 2016-07-11 DIAGNOSIS — N183 Chronic kidney disease, stage 3 (moderate): Secondary | ICD-10-CM | POA: Diagnosis not present

## 2016-07-11 DIAGNOSIS — E1122 Type 2 diabetes mellitus with diabetic chronic kidney disease: Secondary | ICD-10-CM | POA: Diagnosis not present

## 2016-07-11 DIAGNOSIS — Z713 Dietary counseling and surveillance: Secondary | ICD-10-CM | POA: Insufficient documentation

## 2016-07-11 DIAGNOSIS — Z794 Long term (current) use of insulin: Secondary | ICD-10-CM | POA: Insufficient documentation

## 2016-07-11 NOTE — Progress Notes (Signed)
Diabetes Self-Management Education  Visit Type: First/Initial  Appt. Start Time: 1545 Appt. End Time: 0938  07/11/2016  [name], identified by name and date of birth, is a 71 y.o. male with a diagnosis of Diabetes: Type 2. Other hx includes CAD s/p CABG 1 year ago, HTN, retinopathy, ITA, CKD stage 3 and polyneuropathy.    Weight hx: 250 lbs highest weight in the lat 1980's/early 1990's 185/190 lbs 1 year ago prior to CABG.  He then lost his appetite and was not eating well.  He lost to 165 lbs. Weight today 175 lbs.  He would like to get back to 180/190 lbs. We discussed that current weight is fine but if he gained muscle with exercise that would be acceptable.  Patient lives alone after his wife died in 04-30-2010.  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 He currently works for [employer].  ASSESSMENT  Height 5\' 11"  (1.803 m), weight 175 lb (79.4 kg). Body mass index is 24.41 kg/m.      Diabetes Self-Management Education - 07/11/16 1604      Visit Information   Visit Type First/Initial     Initial Visit   Diabetes Type Type 2   Are you currently following a meal plan? No   Are you taking your medications as prescribed? Yes   Date Diagnosed [place]   How would you rate your overall health? Fair     Psychosocial Assessment   Patient Belief/Attitude about Diabetes Motivated to manage diabetes   Self-care barriers None   Self-management support Doctor's office   Other persons present Patient   Patient Concerns Nutrition/Meal planning   Special Needs None   Preferred Learning Style No preference indicated   Learning Readiness Ready   How often do you need to have someone help you when you read instructions, pamphlets, or other written materials from your doctor or pharmacy? 1 - Never   What is the last grade level you completed in school? [employer]     Pre-Education Assessment   Patient understands  the diabetes disease and treatment process. Needs Review   Patient understands incorporating nutritional management into lifestyle. Needs Review   Patient undertands incorporating physical activity into lifestyle. Needs Review   Patient understands using medications safely. Needs Review   Patient understands monitoring blood glucose, interpreting and using results Needs Review   Patient understands prevention, detection, and treatment of acute complications. Needs Review   Patient understands prevention, detection, and treatment of chronic complications. Needs Review   Patient understands how to develop strategies to address psychosocial issues. Needs Review   Patient understands how to develop strategies to promote health/change behavior. Needs Review     Complications   Last HgB A1C per patient/outside source 6.9 %  10/10/15 (12.1% ? 02/2016- wasn't taking insulin like he should due to expense)_   How often do you check your blood sugar? 0 times/day (not testing)  forgetting to due it     Dietary Intake   Breakfast honey bunches of oats cereal and 2% milk  9:30   Snack (morning) none   Lunch ham or chicken sandwich, pinto beans, cabbage and onions  11:30   Snack (afternoon) peanut butter NABS  1:30   Dinner [employer] chicken tenders, fries, sweet tea, chocolate milk OR salad, baked potato, cabbage or slaw, baked chicken or fish, beans   Snack (evening) ice cream sandwich  Beverage(s) sweet tea, chocolate milk, 2% milk, water, occasional juice, occasional mass gainer shake     Exercise   Exercise Type Light (walking / raking leaves)  member of the [employer] but has not been going recenlty.  Mows his own lawn   How many days per week to you exercise? 3   How many minutes per day do you exercise? 60   Total minutes per week of exercise 180     Patient Education   Previous Diabetes Education Yes (please comment)  years ago   Disease state  Definition of diabetes, type 1 and 2, and the  diagnosis of diabetes   Nutrition management  Role of diet in the treatment of diabetes and the relationship between the three main macronutrients and blood glucose level;Food label reading, portion sizes and measuring food.;Meal options for control of blood glucose level and chronic complications.;Meal timing in regards to the patients' current diabetes medication.   Physical activity and exercise  Role of exercise on diabetes management, blood pressure control and cardiac health.;Helped patient identify appropriate exercises in relation to his/her diabetes, diabetes complications and other health issue.   Monitoring Purpose and frequency of SMBG.;Identified appropriate SMBG and/or A1C goals.   Chronic complications Relationship between chronic complications and blood glucose control   Psychosocial adjustment Worked with patient to identify barriers to care and solutions;Role of stress on diabetes;Identified and addressed patients feelings and concerns about diabetes   Personal strategies to promote health Lifestyle issues that need to be addressed for better diabetes care     Individualized Goals (developed by patient)   Nutrition General guidelines for healthy choices and portions discussed   Physical Activity Exercise 5-7 days per week;30 minutes per day   Medications take my medication as prescribed   Monitoring  test my blood glucose as discussed   Problem Solving remembering to take insulin   Reducing Risk do foot checks daily   Health Coping discuss diabetes with (comment)  MD/RD/CDE     Post-Education Assessment   Patient understands the diabetes disease and treatment process. Demonstrates understanding / competency   Patient understands incorporating nutritional management into lifestyle. Demonstrates understanding / competency   Patient undertands incorporating physical activity into lifestyle. Demonstrates understanding / competency   Patient understands using medications safely.  Demonstrates understanding / competency   Patient understands monitoring blood glucose, interpreting and using results Demonstrates understanding / competency   Patient understands prevention, detection, and treatment of acute complications. Demonstrates understanding / competency   Patient understands prevention, detection, and treatment of chronic complications. Demonstrates understanding / competency   Patient understands how to develop strategies to address psychosocial issues. Demonstrates understanding / competency   Patient understands how to develop strategies to promote health/change behavior. Demonstrates understanding / competency     Outcomes   Expected Outcomes Demonstrated interest in learning. Expect positive outcomes   Future DMSE PRN   Program Status Completed      Individualized Plan for Diabetes Self-Management Training:   Learning Objective:  Patient will have a greater understanding of diabetes self-management. Patient education plan is to attend individual and/or group sessions per assessed needs and concerns.   Plan:   Patient Instructions  Stay active most days.  Aim for at least 30 minutes per day of walking or going to the gym. Rethink what you drink.  Aim for beverages without added sugar. Choose other options than cereal most mornings Read your labels regarding portion size and the amount of carbohydrates in your  food.  Aim for 4 Carb Choices per meal (60 grams) +/- 1 either way  Aim for 0-2 Carbs per snack if hungry  Include protein in moderation with your meals and snacks Consider reading food labels for Total Carbohydrate and Fat Grams of foods Consider checking BG at alternate times per day as directed by MD  Consider taking medication as directed by MD      Expected Outcomes:  Demonstrated interest in learning. Expect positive outcomes  Education material provided: Living Well with Diabetes, Food label handouts, A1C conversion sheet, Meal plan  card, My Plate and Snack sheet, breakfast ideas  If problems or questions, patient to contact team via:  Phone  Future DSME appointment: PRN

## 2016-07-11 NOTE — Patient Instructions (Addendum)
Stay active most days.  Aim for at least 30 minutes per day of walking or going to the gym. Rethink what you drink.  Aim for beverages without added sugar. Choose other options than cereal most mornings Read your labels regarding portion size and the amount of carbohydrates in your food.  Aim for 4 Carb Choices per meal (60 grams) +/- 1 either way  Aim for 0-2 Carbs per snack if hungry  Include protein in moderation with your meals and snacks Consider reading food labels for Total Carbohydrate and Fat Grams of foods Consider checking BG at alternate times per day as directed by MD  Consider taking medication as directed by MD

## 2016-08-19 ENCOUNTER — Encounter: Payer: Self-pay | Admitting: Interventional Cardiology

## 2016-08-28 ENCOUNTER — Ambulatory Visit: Payer: Commercial Managed Care - HMO | Admitting: Endocrinology

## 2016-09-01 NOTE — Progress Notes (Signed)
Cardiology Office Note    Date:  09/03/2016   ID:  Ashkan, Chamberland [birth year], MRN [id number]  PCP:  Mayra Neer, MD  Cardiologist: Sinclair Grooms, MD   Chief Complaint  Patient presents with  . Coronary Artery Disease    History of Present Illness:  Joseph Sanford is a 71 y.o. male follow-up of multivessel coronary disease with recent CABG, cerebrovascular disease with prior stroke, chronic kidney disease, chronic diastolic heart failure, hyperlipidemia, and essential hypertension.  Atticus looks great. He denies chest pain. No dyspnea. He has gained weight. Says he feels well all the time. He voices no limitations. He is on supplements over-the-counter.  Past Medical History:  Diagnosis Date  . Chronic kidney disease   . Coronary artery disease   . CVA (cerebral infarction)    08/2011, NO RESIDUAL DEFICITS  . Diabetes mellitus without complication (HCC)    type II  . ED (erectile dysfunction)   . Gout   . History of kidney stones   . Hyperlipidemia   . Hypertension   . Irregular heart beat    "a long time ago"  . Myocardial infarction ([place])    1997    Past Surgical History:  Procedure Laterality Date  . CARDIAC CATHETERIZATION N/A 06/20/2015   Procedure: Left Heart Cath and Coronary Angiography;  Surgeon: Belva Crome, MD;  Location: [facility];  Service: Cardiovascular;  Laterality: N/A;  . CARDIAC CATHETERIZATION  1997   angioplasty  . CARDIAC CATHETERIZATION  2010   stents placed  . CATARACT EXTRACTION W/ INTRAOCULAR LENS  IMPLANT, BILATERAL    . COLONOSCOPY    . CORONARY ARTERY BYPASS GRAFT N/A 07/04/2015   Procedure: CORONARY ARTERY BYPASS GRAFTING (CABG) x 4;  Surgeon: Ivin Poot, MD;  Location: [facility];  Service: Open Heart Surgery;  Laterality: N/A;  . KNEE SURGERY Right    some type of knee cap surgery per pt  . MULTIPLE TOOTH EXTRACTIONS    . TEE WITHOUT CARDIOVERSION N/A 07/04/2015   Procedure: TRANSESOPHAGEAL ECHOCARDIOGRAM (TEE);   Surgeon: Ivin Poot, MD;  Location: [facility];  Service: Open Heart Surgery;  Laterality: N/A;    Current Medications: Outpatient Medications Prior to Visit  Medication Sig Dispense Refill  . aspirin EC 325 MG tablet Take 1 tablet (325 mg total) by mouth daily.    . B Complex-C (B-COMPLEX WITH VITAMIN C) tablet Take 1 tablet by mouth daily.    . Calcium Carb-Cholecalciferol (CALCIUM 600 + D PO) Take 600 mg by mouth daily.    . Cholecalciferol (VITAMIN D3 SUPER STRENGTH PO) Take 1 tablet by mouth daily.    . Cyanocobalamin (VITAMIN B-12) 5000 MCG TBDP Take 5,000 mcg by mouth daily.    . Folic Acid 0.8 MG CAPS Take 1 capsule by mouth daily.    . Garlic 8841 MG CAPS Take 1,000 mg by mouth daily.    . insulin NPH Human (NOVOLIN N RELION) 100 UNIT/ML injection Inject 0.35 mLs (35 Units total) into the skin every morning. And syringes 1/day 10 mL 11  . Magnesium 250 MG TABS Take 250 mg by mouth daily.    . Omega-3 Fatty Acids (FISH OIL) 1200 MG CAPS Take 1 capsule by mouth daily.    . simvastatin (ZOCOR) 40 MG tablet Take 40 mg by mouth daily.    . Zinc 50 MG CAPS Take 50 mg by mouth daily.     Marland Kitchen acetaminophen (TYLENOL) 500 MG  tablet Take 500 mg by mouth daily as needed.    . Coenzyme Q10 (COQ10) 50 MG CAPS Take 50 mg by mouth daily.     No facility-administered medications prior to visit.      Allergies:   Contrast media [iodinated diagnostic agents]; Lisinopril; Lipitor [atorvastatin]; Metformin and related; Iodine; and Shellfish allergy   Social History   Social History  . Marital status: Widowed    Spouse name: N/A  . Number of children: N/A  . Years of education: N/A   Social History Main Topics  . Smoking status: Never Smoker  . Smokeless tobacco: Never Used  . Alcohol use No  . Drug use: No  . Sexual activity: Not Asked   Other Topics Concern  . None   Social History Narrative  . None     Family History:  The patient's family history includes Diabetes in his brother.    ROS:   Please see the history of present illness.    Cough, vision disturbance, and hearing loss. He's had difficulty with cough on ACE inhibitor therapy. Post bypass surgery, relative dehydration and poor nutrition led to very low blood pressures requiring that antihypertensives be discontinued.  All other systems reviewed and are negative.   PHYSICAL EXAM:   VS:  BP (!) 152/80 (BP Location: Right Arm)   Pulse 70   Ht 5' 10.5" (1.791 m)   Wt 175 lb (79.4 kg)   BMI 24.75 kg/m    GEN: Well nourished, well developed, in no acute distress  HEENT: normal  Neck: no JVD, carotid bruits, or masses Cardiac: RRR; no murmurs, rubs, or gallops,no edema  Respiratory:  clear to auscultation bilaterally, normal work of breathing GI: soft, nontender, nondistended, + BS MS: no deformity or atrophy  Skin: warm and dry, no rash Neuro:  Alert and Oriented x 3, Strength and sensation are intact Psych: euthymic mood, full affect  Wt Readings from Last 3 Encounters:  09/03/16 175 lb (79.4 kg)  07/11/16 175 lb (79.4 kg)  02/20/16 176 lb (79.8 kg)      Studies/Labs Reviewed:   EKG:  EKG  Normal sinus rhythm, small inferior Q waves, prominent voltage, and first-degree AV block. Since the prior tracing in July 2017, the heart rate is slower, precordial biphasic T waves are improved.  Recent Labs: No results found for requested labs within last 8760 hours.   Lipid Panel    Component Value Date/Time   CHOL 122 (L) 07/27/2015 0929   TRIG 90 07/27/2015 0929   HDL 44 07/27/2015 0929   CHOLHDL 2.8 07/27/2015 0929   VLDL 18 07/27/2015 0929   LDLCALC 60 07/27/2015 0929    Additional studies/ records that were reviewed today include:  New data includes a lipid panel from Dr. Raliegh Ip. Brigitte Pulse which revealed an LDL of 128 in June and a total cholesterol of 186. Creatinine was 1.1 and March. Potassium was 4.1.    ASSESSMENT:    1. Coronary artery disease of bypass graft of native heart with stable  angina pectoris ([place])   2. Essential hypertension   3. Type 2 diabetes mellitus with stage 3 chronic kidney disease, with long-term current use of insulin (HCC)   4. Other hyperlipidemia      PLAN:  In order of problems listed above:  1. Decrease aspirin to 81 mg per day. 2. Now that he is adequately nourished and for the recover from surgery, blood pressure has again become a problem. He has cough with  ACE inhibitor therapy. Her irbesartan 150 mg per day is prescribed for both blood pressure control and renal protection. Comprehensive metabolic panel and lipid panel will be done in 3 weeks when he returns to blood pressure clinic for further optimization if needed. 3. No recent A1c. Diabetes is being managed by primary care, Dr. Mayra Neer. 4. LDL target is less than 70. Add Zetia 10 mg per day. Lipid panel in 3 weeks.  Clinical follow-up in one year. Aerobic activity is encouraged. To achieve a better LDL we discussed low-fat diet and have instituted Zetia 10 mg. Downward adjustment of aspirin is made today. Addition of ARB for renal protection and hypertension is instituted. Call if angina or medication side effects.    Medication Adjustments/Labs and Tests Ordered: Current medicines are reviewed at length with the patient today.  Concerns regarding medicines are outlined above.  Medication changes, Labs and Tests ordered today are listed in the Patient Instructions below. There are no Patient Instructions on file for this visit.   Signed, Sinclair Grooms, MD  09/03/2016 9:19 AM    [facility] [address], [place], [place]  [address] Phone: [phone]; Fax: [phone]

## 2016-09-03 ENCOUNTER — Ambulatory Visit (INDEPENDENT_AMBULATORY_CARE_PROVIDER_SITE_OTHER): Payer: Medicare HMO | Admitting: Interventional Cardiology

## 2016-09-03 ENCOUNTER — Encounter: Payer: Self-pay | Admitting: Interventional Cardiology

## 2016-09-03 VITALS — BP 152/80 | HR 70 | Ht 70.5 in | Wt 175.0 lb

## 2016-09-03 DIAGNOSIS — N183 Chronic kidney disease, stage 3 unspecified: Secondary | ICD-10-CM

## 2016-09-03 DIAGNOSIS — E784 Other hyperlipidemia: Secondary | ICD-10-CM

## 2016-09-03 DIAGNOSIS — Z794 Long term (current) use of insulin: Secondary | ICD-10-CM

## 2016-09-03 DIAGNOSIS — I10 Essential (primary) hypertension: Secondary | ICD-10-CM

## 2016-09-03 DIAGNOSIS — I25708 Atherosclerosis of coronary artery bypass graft(s), unspecified, with other forms of angina pectoris: Secondary | ICD-10-CM | POA: Diagnosis not present

## 2016-09-03 DIAGNOSIS — E1122 Type 2 diabetes mellitus with diabetic chronic kidney disease: Secondary | ICD-10-CM

## 2016-09-03 DIAGNOSIS — E7849 Other hyperlipidemia: Secondary | ICD-10-CM

## 2016-09-03 MED ORDER — IRBESARTAN 150 MG PO TABS: 150.0000 mg | ORAL_TABLET | Freq: Every day | ORAL | 3 refills | Status: AC

## 2016-09-03 MED ORDER — ASPIRIN EC 81 MG PO TBEC: 81.0000 mg | DELAYED_RELEASE_TABLET | Freq: Every day | ORAL | 3 refills | Status: AC

## 2016-09-03 MED ORDER — EZETIMIBE 10 MG PO TABS: 10.0000 mg | ORAL_TABLET | Freq: Every day | ORAL | 3 refills | Status: AC

## 2016-09-03 NOTE — Patient Instructions (Signed)
Medication Instructions:  1) START Irbesartan 150mg  once daily 2) START Zetia 10mg  once daily 3) DECREASE Aspirin to 81mg  once daily  Labwork: Your physician recommends that you return for lab work at time of Hypertension/Hyperlipidemia appointment (CMET, Lipids)   Testing/Procedures: None  Follow-Up: Your physician recommends that you schedule a follow-up appointment in: 3 weeks with our Hypertension and Hyperlipidemia clinic.    Your physician wants you to follow-up in: 1 year with Dr. Tamala Julian. You will receive a reminder letter in the mail two months in advance. If you don't receive a letter, please call our office to schedule the follow-up appointment.   Any Other Special Instructions Will Be Listed Below (If Applicable).     If you need a refill on your cardiac medications before your next appointment, please call your pharmacy.

## 2016-09-24 ENCOUNTER — Other Ambulatory Visit: Payer: Medicare HMO

## 2016-09-24 ENCOUNTER — Ambulatory Visit (INDEPENDENT_AMBULATORY_CARE_PROVIDER_SITE_OTHER): Payer: Medicare HMO | Admitting: Pharmacist

## 2016-09-24 VITALS — BP 122/76 | HR 80

## 2016-09-24 DIAGNOSIS — E7849 Other hyperlipidemia: Secondary | ICD-10-CM

## 2016-09-24 DIAGNOSIS — E784 Other hyperlipidemia: Secondary | ICD-10-CM | POA: Diagnosis not present

## 2016-09-24 DIAGNOSIS — I10 Essential (primary) hypertension: Secondary | ICD-10-CM | POA: Diagnosis not present

## 2016-09-24 DIAGNOSIS — I25708 Atherosclerosis of coronary artery bypass graft(s), unspecified, with other forms of angina pectoris: Secondary | ICD-10-CM | POA: Diagnosis not present

## 2016-09-24 LAB — SPECIMEN STATUS

## 2016-09-24 NOTE — Patient Instructions (Addendum)
Cholesterol:  Stop taking your simvastatin Continue taking your rosuvastatin (Crestor) and ezetimibe (Zetia) each day Your LDL goal is less than 70  Blood pressure:  Your blood pressure looked great today - your goal is less than 130/80 Continue to take your irbesartan each day Purchase a new blood pressure cuff and monitor your blood pressure at home. Call clinic if you notice your readings stay consistently elevated  We are checking your kidney function, electrolytes, and cholesterol today and will call you with these results

## 2016-09-24 NOTE — Progress Notes (Signed)
Patient ID: [name]                 DOB: 02/16/1945                      MRN: [id number]     HPI: Joseph Sanford is a 71 y.o. male referred by Dr. Tamala Sanford to HTN clinic. PMH is significant for multivessel coronary disease with recent CABG, CVD with prior stroke, CKD, chronic diastolic heart failure, DM, HLD, and HTN.  BP at most recent visit on 09/03/16 was uncontrolled at 152/38mmHg however no BP medication changes were made at that time. Dr Joseph Sanford did start pt on Zetia 10mg  daily 3 weeks ago. Pt presents today for further HTN and lipid management. Lipids and BMET to be checked today.  Pt presents in good spirits today. States that he has not experienced any falls or feelings of faintness, but has experienced 1 episode of dizziness. Pt has been compliant with irbesartan 150mg  daily, but has not taken home BP readings due to his meter being broken. Pt has been taking Crestor 40mg  daily since mid July, simvastatin since February, and Zetia for the last 3 weeks. He has had mild muscle aches but feels the CoQ10 has helped improve these. His BG this morning was 98mg /dL.   Current HTN meds: irbesartan 150mg  daily Previously tried: ACEi - cough BP goal: <130/74mmHg Home BP readings: Pt has not been checking BP at home, states his meter is broken.   Current HLD meds: simvastatin 40mg  daily, Crestor 40mg  daily, Zetia 10mg  daily, fish oil 1200mg  daily, Coenzyme Q10 200mg  daily Intolerances: Lipitor - "brain fog" LDL goal: 70mg /dL Risk factors: CAD s/p CABG, stroke, HTN, DM, age, sex Labs: 07/10/16: TC 186, TG 77, HDL 43, LDL 128 (had started Crestor 40mg  daily 1 week prior by PCP, was still taking simvastatin 40mg  daily)  Family History: DM in his brother  Social History: Denies tobacco, alcohol, and illicit drug use.  Diet: Cheerios in the morning, milk 2%, sandwich for lunch, baked chicken and veggies for dinner.  Does not drink coffee or sodas.  Drinks about 3-16 oz glasses of sweet tea.    Exercise: Walks a lot at work: ~ 1/2 mile. Would like to start going to the gym, set a goal for about 30 minutes 3x/week.  Wt Readings from Last 3 Encounters:  09/03/16 175 lb (79.4 kg)  07/11/16 175 lb (79.4 kg)  02/20/16 176 lb (79.8 kg)   BP Readings from Last 3 Encounters:  09/03/16 (!) 152/80  02/20/16 (!) 146/94  11/27/15 140/76   Pulse Readings from Last 3 Encounters:  09/03/16 70  02/20/16 79  11/27/15 81    Renal function: CrCl cannot be calculated (Patient's most recent lab result is older than the maximum 21 days allowed.).  Past Medical History:  Diagnosis Date  . Chronic kidney disease   . Coronary artery disease   . CVA (cerebral infarction)    08/2011, NO RESIDUAL DEFICITS  . Diabetes mellitus without complication (HCC)    type II  . ED (erectile dysfunction)   . Gout   . History of kidney stones   . Hyperlipidemia   . Hypertension   . Irregular heart beat    "a long time ago"  . Myocardial infarction [facility])    1997    Current Outpatient Prescriptions on File Prior to Visit  Medication Sig Dispense Refill  . aspirin EC 81 MG tablet Take 1 tablet (  81 mg total) by mouth daily. 90 tablet 3  . B Complex-C (B-COMPLEX WITH VITAMIN C) tablet Take 1 tablet by mouth daily.    . Calcium Carb-Cholecalciferol (CALCIUM 600 + D PO) Take 600 mg by mouth daily.    . Cholecalciferol (VITAMIN D3 SUPER STRENGTH PO) Take 1 tablet by mouth daily.    . Coenzyme Q10 (COQ10) 200 MG CAPS Take 200 mg by mouth daily.    . Cyanocobalamin (VITAMIN B-12) 5000 MCG TBDP Take 5,000 mcg by mouth daily.    Marland Kitchen ezetimibe (ZETIA) 10 MG tablet Take 1 tablet (10 mg total) by mouth daily. 90 tablet 3  . Folic Acid 0.8 MG CAPS Take 1 capsule by mouth daily.    . Garlic 4259 MG CAPS Take 1,000 mg by mouth daily.    . insulin NPH Human (NOVOLIN N RELION) 100 UNIT/ML injection Inject 0.35 mLs (35 Units total) into the skin every morning. And syringes 1/day 10 mL 11  . irbesartan (AVAPRO) 150  MG tablet Take 1 tablet (150 mg total) by mouth daily. 90 tablet 3  . Magnesium 250 MG TABS Take 250 mg by mouth daily.    . Omega-3 Fatty Acids (FISH OIL) 1200 MG CAPS Take 1 capsule by mouth daily.    Marland Kitchen OVER THE COUNTER MEDICATION Take 1 Package by mouth 2 (two) times daily. [employer] health Supplement Pack    . pyridOXINE (VITAMIN B-6) 100 MG tablet Take 100 mg by mouth daily.    . simvastatin (ZOCOR) 40 MG tablet Take 40 mg by mouth daily.    . Thiamine HCl (VITAMIN B-1) 250 MG tablet Take 250 mg by mouth daily.    . Zinc 50 MG CAPS Take 50 mg by mouth daily.      No current facility-administered medications on file prior to visit.     Allergies  Allergen Reactions  . Contrast Media [Iodinated Diagnostic Agents] Other (See Comments)    "bumps on arm"  . Lisinopril Other (See Comments)    cough  . Lipitor [Atorvastatin] Other (See Comments)    Brain fog  . Metformin And Related Other (See Comments)    "STOMACH UPSET"  . Iodine Nausea And Vomiting  . Shellfish Allergy Nausea And Vomiting    Reaction to shrimp     Assessment/Plan: 1. Hypertension: BP was at goal today <130/65mmHg. Will continue taking irbesartan 150mg  daily. Encouraged patient to purchase a new BP cuff and to check BP at home. Advised him to call clinic with elevated readings. F/u in HTN clinic as needed.  2. Hyperlipidemia: Pt's last lipid panel was on 07/10/16.  LDL was above goal of 70mg /dL at 128mg /dL (on simvastatin 40mg  at the time). Since then, pt has also been taking Crestor 40mg  daily and 3 weeks ago was started on Zetia 10mg  daily. Advised pt to discontinue simvastatin but continue Crestor 40mg , Zetia 10mg  daily, and OTC fish oil 1200mg  daily. Discussed diet and exercise, and patient would like to start weight training with light weights 3x/week. Fasting lipid panel being checked today - anticipate that LDL will be at goal. F/u as needed.   Pt seen with Maple Mirza, [employer] pharmacy  student.   Mir Fullilove E. Bhavana Kady, PharmD, CPP, [place] [address] [place], [place] [address] Phone: [phone]; Fax: [phone] 09/24/2016 11:28 AM

## 2016-09-25 DIAGNOSIS — N183 Chronic kidney disease, stage 3 (moderate): Secondary | ICD-10-CM | POA: Diagnosis not present

## 2016-09-25 DIAGNOSIS — I1310 Hypertensive heart and chronic kidney disease without heart failure, with stage 1 through stage 4 chronic kidney disease, or unspecified chronic kidney disease: Secondary | ICD-10-CM | POA: Diagnosis not present

## 2016-09-25 DIAGNOSIS — E1121 Type 2 diabetes mellitus with diabetic nephropathy: Secondary | ICD-10-CM | POA: Diagnosis not present

## 2016-09-25 DIAGNOSIS — Z794 Long term (current) use of insulin: Secondary | ICD-10-CM | POA: Diagnosis not present

## 2016-09-25 DIAGNOSIS — Z23 Encounter for immunization: Secondary | ICD-10-CM | POA: Diagnosis not present

## 2016-09-25 DIAGNOSIS — I25119 Atherosclerotic heart disease of native coronary artery with unspecified angina pectoris: Secondary | ICD-10-CM | POA: Diagnosis not present

## 2016-09-25 DIAGNOSIS — E782 Mixed hyperlipidemia: Secondary | ICD-10-CM | POA: Diagnosis not present

## 2016-10-01 ENCOUNTER — Ambulatory Visit: Payer: Medicare HMO | Admitting: Endocrinology

## 2016-10-01 DIAGNOSIS — Z0289 Encounter for other administrative examinations: Secondary | ICD-10-CM

## 2016-10-24 LAB — COMPREHENSIVE METABOLIC PANEL
ALT: 53 IU/L — AB (ref 0–44)
AST: 40 IU/L (ref 0–40)
Albumin/Globulin Ratio: 1.6 (ref 1.2–2.2)
Albumin: 4.2 g/dL (ref 3.5–4.8)
Alkaline Phosphatase: 75 IU/L (ref 39–117)
BUN/Creatinine Ratio: 12 (ref 10–24)
BUN: 15 mg/dL (ref 8–27)
Bilirubin Total: 0.3 mg/dL (ref 0.0–1.2)
CALCIUM: 10 mg/dL (ref 8.6–10.2)
CHLORIDE: 103 mmol/L (ref 96–106)
CO2: 25 mmol/L (ref 20–29)
Creatinine, Ser: 1.27 mg/dL (ref 0.76–1.27)
GFR calc Af Amer: 66 mL/min/{1.73_m2} (ref >59)
GFR, EST NON AFRICAN AMERICAN: 57 mL/min/{1.73_m2} — AB (ref >59)
GLUCOSE: 84 mg/dL (ref 65–99)
Globulin, Total: 2.6 g/dL (ref 1.5–4.5)
POTASSIUM: 4.7 mmol/L (ref 3.5–5.2)
Sodium: 144 mmol/L (ref 134–144)
TOTAL PROTEIN: 6.8 g/dL (ref 6.0–8.5)

## 2016-10-24 LAB — LIPID PANEL
CHOL/HDL RATIO: 1.9 ratio (ref 0.0–5.0)
Cholesterol, Total: 105 mg/dL (ref 100–199)
HDL: 56 mg/dL (ref >39)
LDL Calculated: 39 mg/dL (ref 0–99)
TRIGLYCERIDES: 52 mg/dL (ref 0–149)
VLDL Cholesterol Cal: 10 mg/dL (ref 5–40)

## 2016-11-12 ENCOUNTER — Telehealth: Payer: Self-pay | Admitting: Interventional Cardiology

## 2016-11-12 NOTE — Telephone Encounter (Signed)
Spoke with pt and he wanted to know if ok for him to go through scanner and metal detectors at airport.  Advised pt this would be fine.  Pt appreciative for call.

## 2016-11-12 NOTE — Telephone Encounter (Signed)
New message    Pt walked in today to ask if he needs a doctors note to clear him to fly after having a procedure. He said he is flying out on Saturday 11/16/16. Please call.

## 2016-11-26 ENCOUNTER — Other Ambulatory Visit: Payer: Self-pay | Admitting: Physician Assistant

## 2016-11-26 DIAGNOSIS — I25119 Atherosclerotic heart disease of native coronary artery with unspecified angina pectoris: Secondary | ICD-10-CM

## 2016-12-10 ENCOUNTER — Ambulatory Visit: Payer: Medicare HMO | Admitting: Endocrinology

## 2016-12-25 ENCOUNTER — Other Ambulatory Visit: Payer: Self-pay

## 2016-12-25 MED ORDER — ROSUVASTATIN CALCIUM 40 MG PO TABS: 40.0000 mg | ORAL_TABLET | Freq: Every day | ORAL | 1 refills | Status: DC

## 2016-12-27 ENCOUNTER — Other Ambulatory Visit: Payer: Self-pay

## 2016-12-27 MED ORDER — ROSUVASTATIN CALCIUM 40 MG PO TABS: 40.0000 mg | ORAL_TABLET | Freq: Every day | ORAL | 2 refills | Status: AC

## 2016-12-31 ENCOUNTER — Other Ambulatory Visit: Payer: Self-pay

## 2016-12-31 MED ORDER — INSULIN NPH (HUMAN) (ISOPHANE) 100 UNIT/ML ~~LOC~~ SUSP: 35.0000 [IU] | SUBCUTANEOUS | 6 refills | Status: DC

## 2017-02-04 ENCOUNTER — Ambulatory Visit: Payer: Medicare HMO | Admitting: Endocrinology

## 2017-02-25 ENCOUNTER — Ambulatory Visit: Payer: Medicare HMO | Admitting: Endocrinology

## 2017-03-11 ENCOUNTER — Encounter: Payer: Self-pay | Admitting: Endocrinology

## 2017-03-11 ENCOUNTER — Ambulatory Visit: Payer: Medicare HMO | Admitting: Endocrinology

## 2017-03-11 VITALS — BP 142/80 | HR 75 | Ht 70.0 in | Wt 174.0 lb

## 2017-03-11 DIAGNOSIS — E1122 Type 2 diabetes mellitus with diabetic chronic kidney disease: Secondary | ICD-10-CM | POA: Diagnosis not present

## 2017-03-11 DIAGNOSIS — Z794 Long term (current) use of insulin: Secondary | ICD-10-CM | POA: Diagnosis not present

## 2017-03-11 DIAGNOSIS — N183 Chronic kidney disease, stage 3 (moderate): Secondary | ICD-10-CM | POA: Diagnosis not present

## 2017-03-11 LAB — POCT GLYCOSYLATED HEMOGLOBIN (HGB A1C): HEMOGLOBIN A1C: 9.6

## 2017-03-11 MED ORDER — INSULIN NPH (HUMAN) (ISOPHANE) 100 UNIT/ML ~~LOC~~ SUSP: 35.0000 [IU] | SUBCUTANEOUS | 6 refills | Status: DC

## 2017-03-11 NOTE — Patient Instructions (Addendum)
Please continue the same insulin. On this type of insulin schedule, you should eat meals on a regular schedule, especially lunch.  If a meal is missed or significantly delayed, your blood sugar could go low.  Therefore, you should carry a non-perishable snack, such as crackers, while at work.  check your blood sugar twice a day.  vary the time of day when you check, between before the 3 meals, and at bedtime.  also check if you have symptoms of your blood sugar being too high or too low.  please keep a record of the readings and bring it to your next appointment here (or you can bring the meter itself).  You can write it on any piece of paper.  please call us sooner if your blood sugar goes below 70, or if you have a lot of readings over 200.   Please come back for a follow-up appointment in 3 months.

## 2017-03-11 NOTE — Progress Notes (Signed)
Subjective:    Patient ID: Joseph Sanford, male    DOB: March 03, 1945, 72 y.o.   MRN: [id number]  HPI Pt returns for f/u of diabetes mellitus: DM type: Insulin-requiring type 2 Dx'ed: 5621 Complications: polyneuropathy, CAD, retinopathy, TIA, and renal insufficiency.   Therapy: insulin since 2000 DKA: never Severe hypoglycemia: never Pancreatitis: never.  Other: he stopped using V-GO, due to cost; he stopped metformin, due to diarrhea; he takes QD insulin, after poor results with multiple daily injections.   Interval history: pt says he can afford NPH insulin, but he still misses 2-3 times per week.  pt states he feels well in general.  Pt says he seldom checks cbg.   Past Medical History:  Diagnosis Date  . Chronic kidney disease   . Coronary artery disease   . CVA (cerebral infarction)    08/2011, NO RESIDUAL DEFICITS  . Diabetes mellitus without complication (HCC)    type II  . ED (erectile dysfunction)   . Gout   . History of kidney stones   . Hyperlipidemia   . Hypertension   . Irregular heart beat    "a long time ago"  . Myocardial infarction ([place])    1997    Past Surgical History:  Procedure Laterality Date  . CARDIAC CATHETERIZATION N/A 06/20/2015   Procedure: Left Heart Cath and Coronary Angiography;  Surgeon: Belva Crome, MD;  Location: [facility];  Service: Cardiovascular;  Laterality: N/A;  . CARDIAC CATHETERIZATION  1997   angioplasty  . CARDIAC CATHETERIZATION  2010   stents placed  . CATARACT EXTRACTION W/ INTRAOCULAR LENS  IMPLANT, BILATERAL    . COLONOSCOPY    . CORONARY ARTERY BYPASS GRAFT N/A 07/04/2015   Procedure: CORONARY ARTERY BYPASS GRAFTING (CABG) x 4;  Surgeon: Ivin Poot, MD;  Location: [facility];  Service: Open Heart Surgery;  Laterality: N/A;  . KNEE SURGERY Right    some type of knee cap surgery per pt  . MULTIPLE TOOTH EXTRACTIONS    . TEE WITHOUT CARDIOVERSION N/A 07/04/2015   Procedure: TRANSESOPHAGEAL ECHOCARDIOGRAM (TEE);   Surgeon: Ivin Poot, MD;  Location: [facility];  Service: Open Heart Surgery;  Laterality: N/A;    Social History   Socioeconomic History  . Marital status: Widowed    Spouse name: Not on file  . Number of children: Not on file  . Years of education: Not on file  . Highest education level: Not on file  Social Needs  . Financial resource strain: Not on file  . Food insecurity - worry: Not on file  . Food insecurity - inability: Not on file  . Transportation needs - medical: Not on file  . Transportation needs - non-medical: Not on file  Occupational History  . Not on file  Tobacco Use  . Smoking status: Never Smoker  . Smokeless tobacco: Never Used  Substance and Sexual Activity  . Alcohol use: No    Alcohol/week: 0.0 oz  . Drug use: No  . Sexual activity: Not on file  Other Topics Concern  . Not on file  Social History Narrative  . Not on file    Current Outpatient Medications on File Prior to Visit  Medication Sig Dispense Refill  . aspirin EC 81 MG tablet Take 1 tablet (81 mg total) by mouth daily. 90 tablet 3  . irbesartan (AVAPRO) 150 MG tablet Take 1 tablet (150 mg total) by mouth daily. 90 tablet 3  . OVER THE COUNTER  MEDICATION Take 1 Package by mouth 2 (two) times daily. [employer] health Supplement Pack    . rosuvastatin (CRESTOR) 40 MG tablet Take 1 tablet (40 mg total) by mouth daily. 90 tablet 2  . Biotin 10000 MCG TABS Take 10,000 mcg by mouth daily.    . Calcium Carb-Cholecalciferol (CALCIUM 600 + D PO) Take 600 mg by mouth daily.    . Cholecalciferol (VITAMIN D3 SUPER STRENGTH PO) Take 1 tablet by mouth daily.    . Coenzyme Q10 (COQ10) 200 MG CAPS Take 200 mg by mouth daily.    . Cyanocobalamin (VITAMIN B-12) 5000 MCG TBDP Take 5,000 mcg by mouth daily.    Marland Kitchen ezetimibe (ZETIA) 10 MG tablet Take 1 tablet (10 mg total) by mouth daily. 90 tablet 3  . Folic Acid 0.8 MG CAPS Take 1 capsule by mouth daily.    . Garlic 2993 MG CAPS Take 1,000 mg by mouth  daily.    . Magnesium 250 MG TABS Take 250 mg by mouth daily.    . Omega-3 Fatty Acids (FISH OIL) 1200 MG CAPS Take 1 capsule by mouth daily.    Marland Kitchen pyridOXINE (VITAMIN B-6) 100 MG tablet Take 100 mg by mouth daily.    . Thiamine HCl (VITAMIN B-1) 250 MG tablet Take 250 mg by mouth daily.    . Zinc 50 MG CAPS Take 50 mg by mouth daily.      No current facility-administered medications on file prior to visit.     Allergies  Allergen Reactions  . Contrast Media [Iodinated Diagnostic Agents] Other (See Comments)    "bumps on arm"  . Lisinopril Other (See Comments)    cough  . Lipitor [Atorvastatin] Other (See Comments)    Brain fog  . Metformin And Related Other (See Comments)    "STOMACH UPSET"  . Iodine Nausea And Vomiting  . Shellfish Allergy Nausea And Vomiting    Reaction to shrimp    Family History  Problem Relation Age of Onset  . Diabetes Brother     BP (!) 142/80   Pulse 75   Ht 5\' 10"  (1.778 m)   Wt 174 lb (78.9 kg)   SpO2 99%   BMI 24.97 kg/m    Review of Systems He denies hypoglycemia.      Objective:   Physical Exam VITAL SIGNS:  See vs page GENERAL: no distress.  Pulses: dorsalis pedis intact bilat.   MSK: no deformity of the feet.  CV: no leg edema.  Skin:  no ulcer on the feet.  normal color and temp on the feet.  Neuro: sensation is intact to touch on the feet, but decreased from normal.    Lab Results  Component Value Date   HGBA1C 9.6 03/11/2017   Lab Results  Component Value Date   CREATININE 1.27 09/24/2016   BUN 15 09/24/2016   NA 144 09/24/2016   K 4.7 09/24/2016   CL 103 09/24/2016   CO2 25 09/24/2016       Assessment & Plan:  Insulin-requiring type 2 DM, with renal insuff: worse Noncompliance, persistent: we discussed risks  Patient Instructions  Please continue the same insulin. On this type of insulin schedule, you should eat meals on a regular schedule, especially lunch.  If a meal is missed or significantly delayed, your  blood sugar could go low.  Therefore, you should carry a non-perishable snack, such as crackers, while at work.  check your blood sugar twice a day.  vary the  time of day when you check, between before the 3 meals, and at bedtime.  also check if you have symptoms of your blood sugar being too high or too low.  please keep a record of the readings and bring it to your next appointment here (or you can bring the meter itself).  You can write it on any piece of paper.  please call us sooner if your blood sugar goes below 70, or if you have a lot of readings over 200.   Please come back for a follow-up appointment in 3 months.

## 2017-04-02 DIAGNOSIS — E782 Mixed hyperlipidemia: Secondary | ICD-10-CM | POA: Diagnosis not present

## 2017-04-02 DIAGNOSIS — N529 Male erectile dysfunction, unspecified: Secondary | ICD-10-CM | POA: Diagnosis not present

## 2017-04-02 DIAGNOSIS — I1310 Hypertensive heart and chronic kidney disease without heart failure, with stage 1 through stage 4 chronic kidney disease, or unspecified chronic kidney disease: Secondary | ICD-10-CM | POA: Diagnosis not present

## 2017-04-02 DIAGNOSIS — Z125 Encounter for screening for malignant neoplasm of prostate: Secondary | ICD-10-CM | POA: Diagnosis not present

## 2017-04-02 DIAGNOSIS — Z0000 Encounter for general adult medical examination without abnormal findings: Secondary | ICD-10-CM | POA: Diagnosis not present

## 2017-04-02 DIAGNOSIS — N183 Chronic kidney disease, stage 3 (moderate): Secondary | ICD-10-CM | POA: Diagnosis not present

## 2017-04-02 DIAGNOSIS — E1121 Type 2 diabetes mellitus with diabetic nephropathy: Secondary | ICD-10-CM | POA: Diagnosis not present

## 2017-04-02 DIAGNOSIS — E113599 Type 2 diabetes mellitus with proliferative diabetic retinopathy without macular edema, unspecified eye: Secondary | ICD-10-CM | POA: Diagnosis not present

## 2017-04-02 DIAGNOSIS — I25119 Atherosclerotic heart disease of native coronary artery with unspecified angina pectoris: Secondary | ICD-10-CM | POA: Diagnosis not present

## 2017-04-11 DIAGNOSIS — R42 Dizziness and giddiness: Secondary | ICD-10-CM | POA: Diagnosis not present

## 2017-04-11 DIAGNOSIS — I959 Hypotension, unspecified: Secondary | ICD-10-CM | POA: Diagnosis not present

## 2017-04-11 DIAGNOSIS — R5383 Other fatigue: Secondary | ICD-10-CM | POA: Diagnosis not present

## 2017-06-03 ENCOUNTER — Ambulatory Visit: Payer: Medicare HMO | Admitting: Endocrinology

## 2017-07-24 IMAGING — CR DG CHEST 2V
2 series · 2 of 2 positions shown · non-contrast
Comparison: 11/05/2008.

CLINICAL DATA: CABG.  Preoperative exam.

EXAM:
CHEST  2 VIEW

[w chest pa]
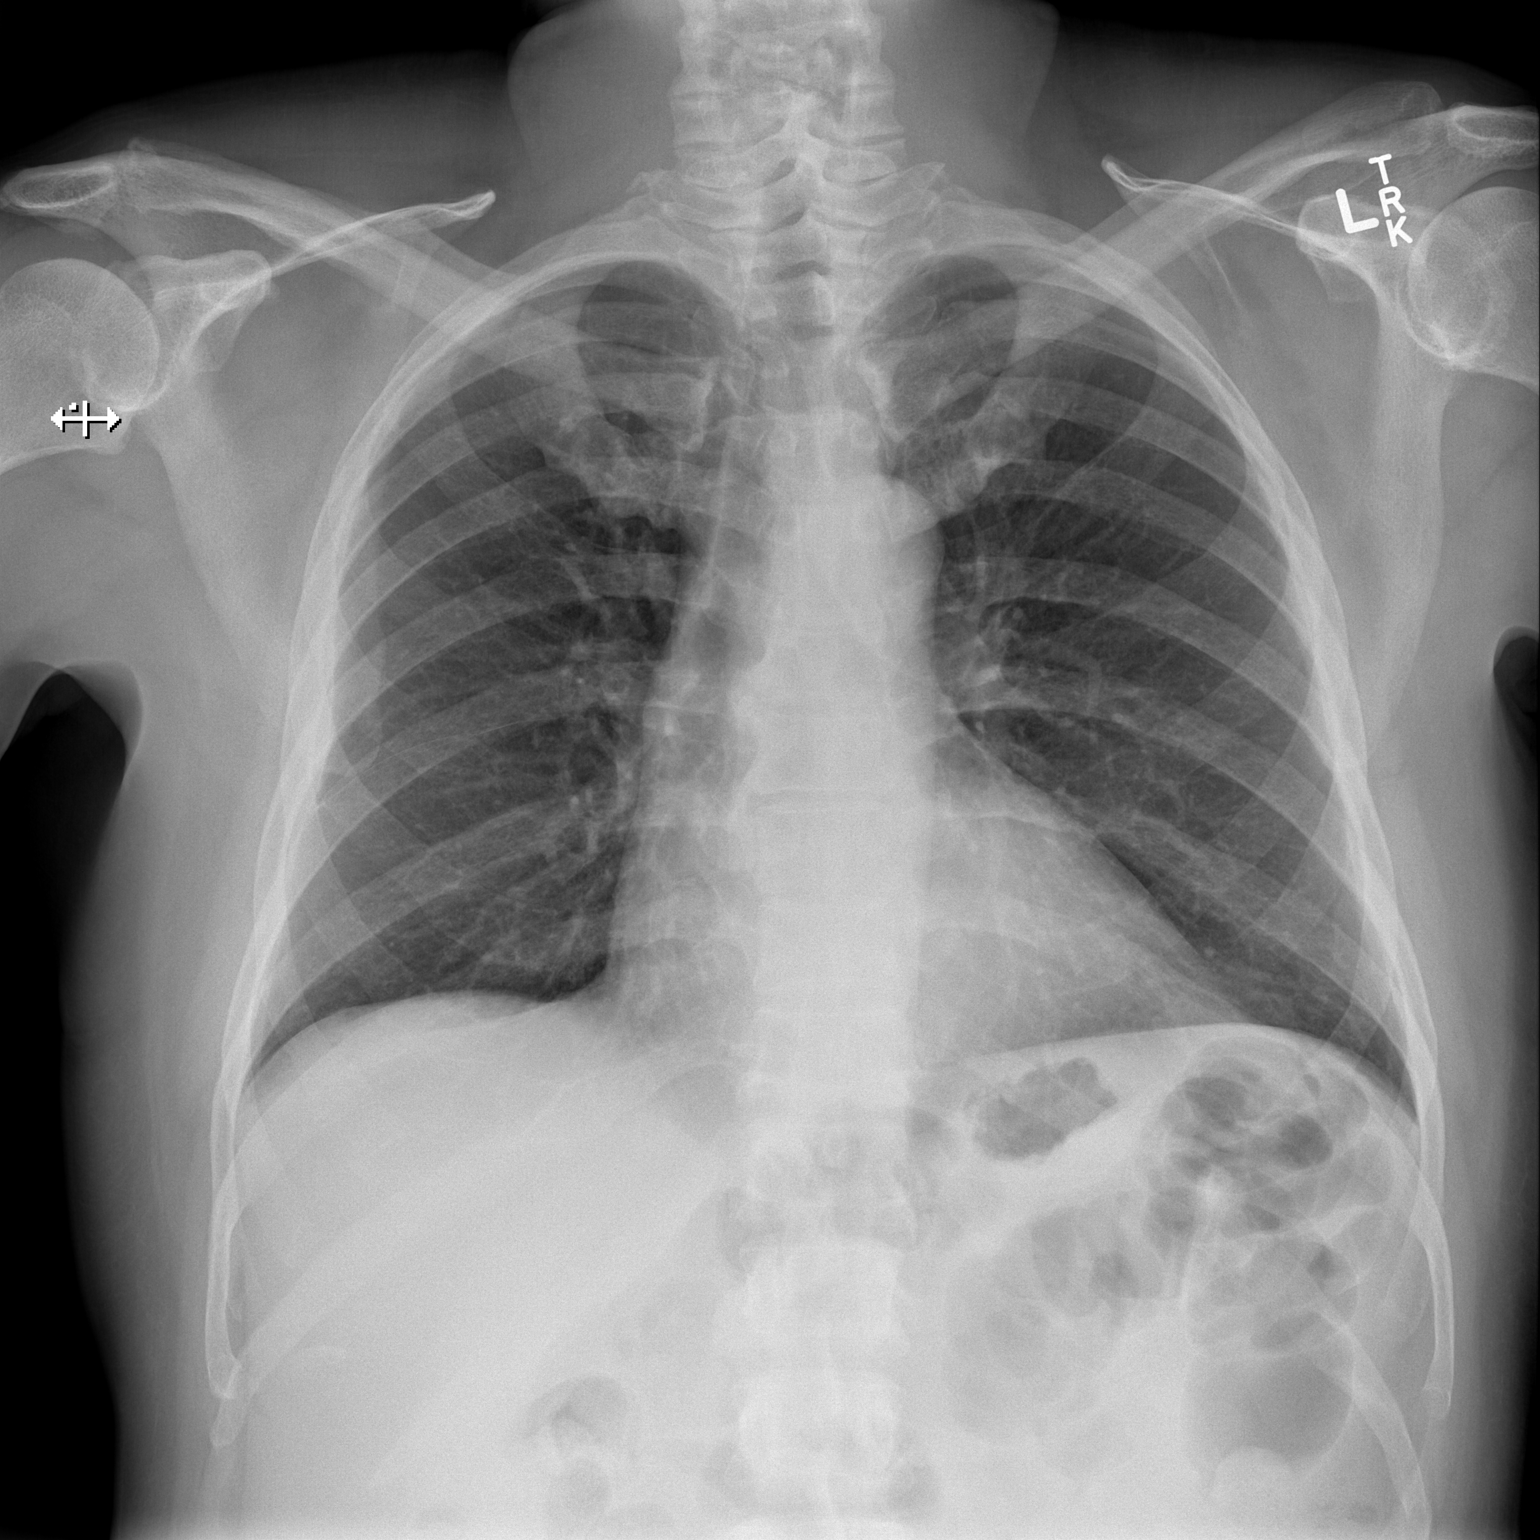

[w chest lat]
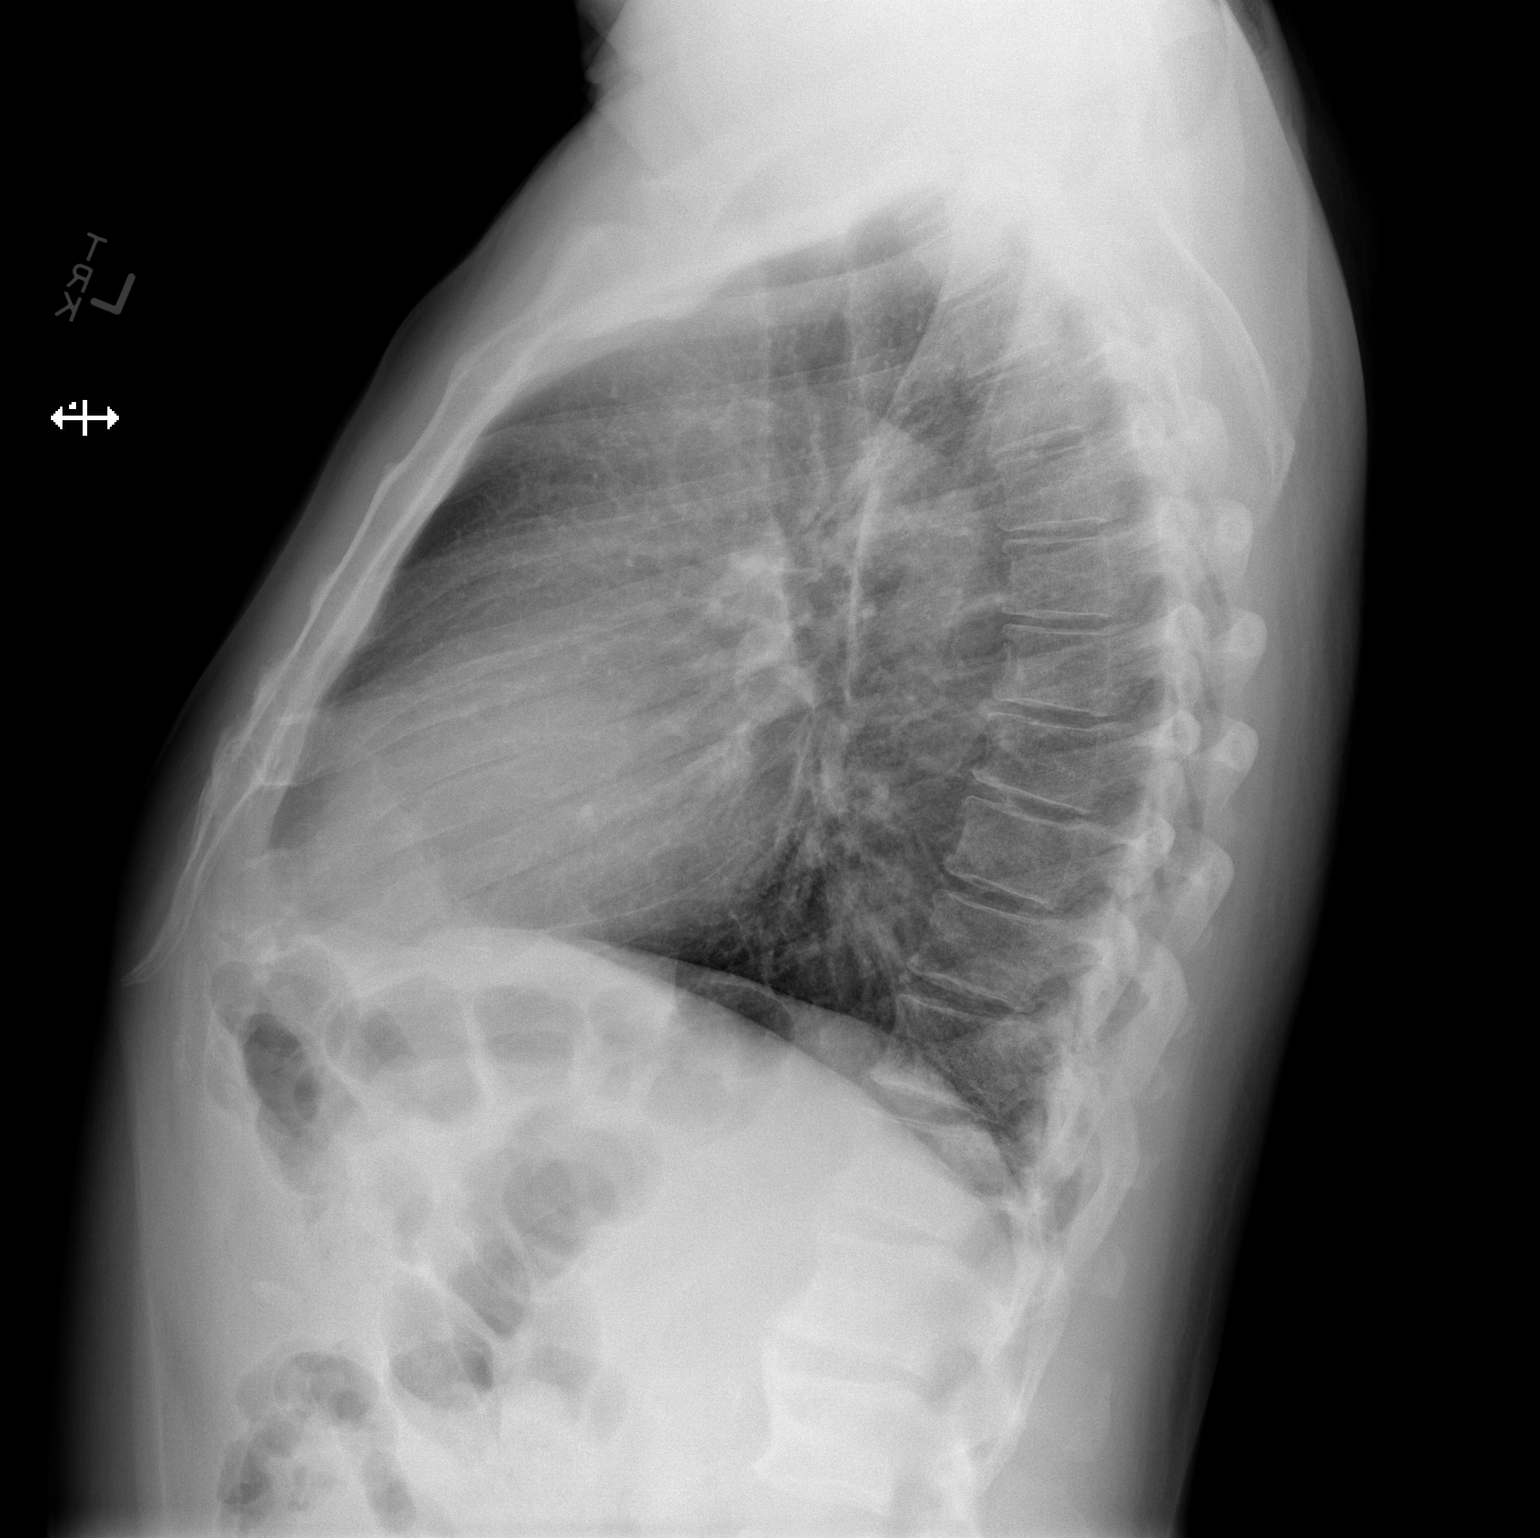

[2 of 2 positions shown; findings below may reference images not displayed]

FINDINGS: Mediastinum hilar structures normal. Lungs are clear. Mild
cardiomegaly. No pulmonary venous congestion. No focal infiltrate.
No pleural effusion or pneumothorax. Mild bilateral pleural
thickening noted consistent scarring. Exam stable from prior exam.
IMPRESSION: Stable mild cardiomegaly.  No acute pulmonary disease.

## 2017-07-30 IMAGING — DX DG CHEST 1V PORT
1 series · 1 of 1 positions shown · non-contrast
Comparison: 06/28/2015

CLINICAL DATA: Postop from CABG. Coronary artery disease. Previous
myocardial infarction and stroke.

EXAM:
PORTABLE CHEST 1 VIEW

[chest ap]
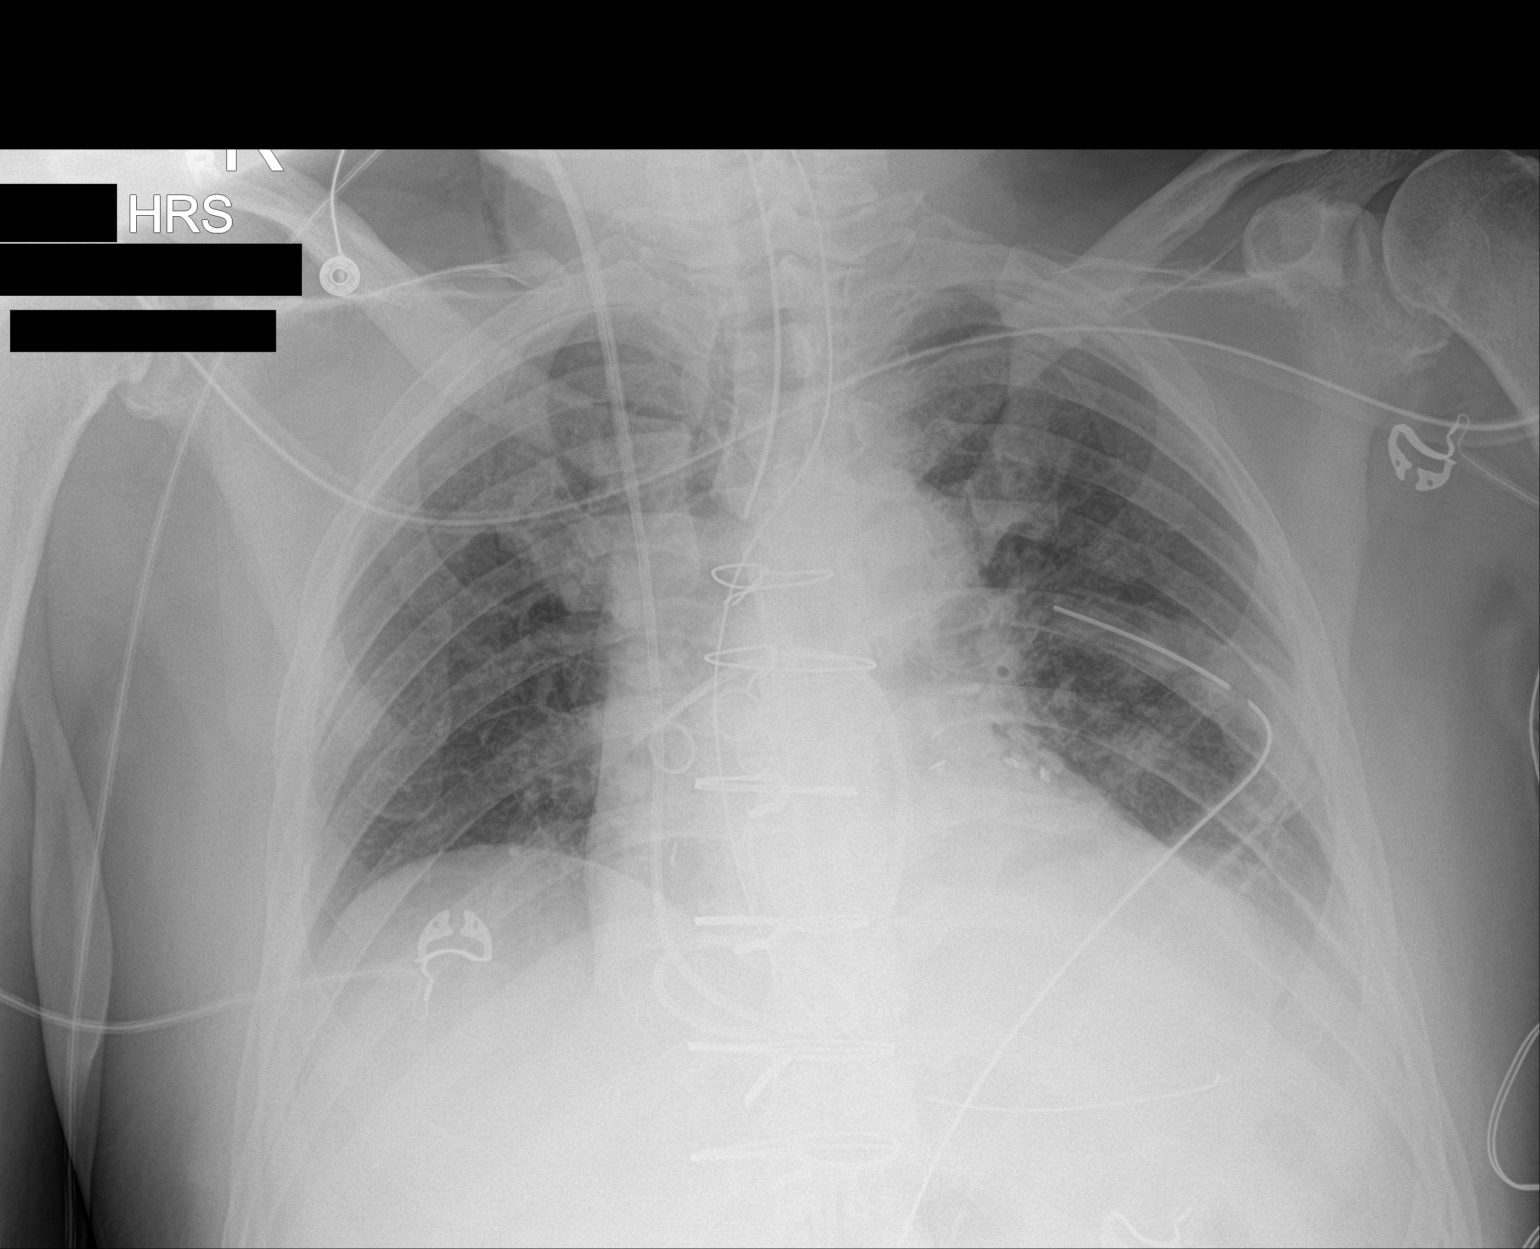

[1 of 1 positions shown; findings below may reference images not displayed]

FINDINGS: Patient has undergone interval CABG. Swan-Ganz catheter tip is seen
in the right interlobar pulmonary artery. Endotracheal tube,
nasogastric tube, and left-sided chest tube are seen in appropriate
position.

Low lung volumes are seen with mild bibasilar atelectasis. No
evidence of pneumothorax or pleural effusion. Heart size is within
normal limits.
IMPRESSION: Postoperative chest with bibasilar atelectasis. No pneumothorax
visualized.

## 2017-07-31 IMAGING — CR DG CHEST 1V PORT
1 series · 1 of 1 positions shown · non-contrast
Comparison: 07/04/2015

CLINICAL DATA: Post CABG

EXAM:
PORTABLE CHEST 1 VIEW

[AP]
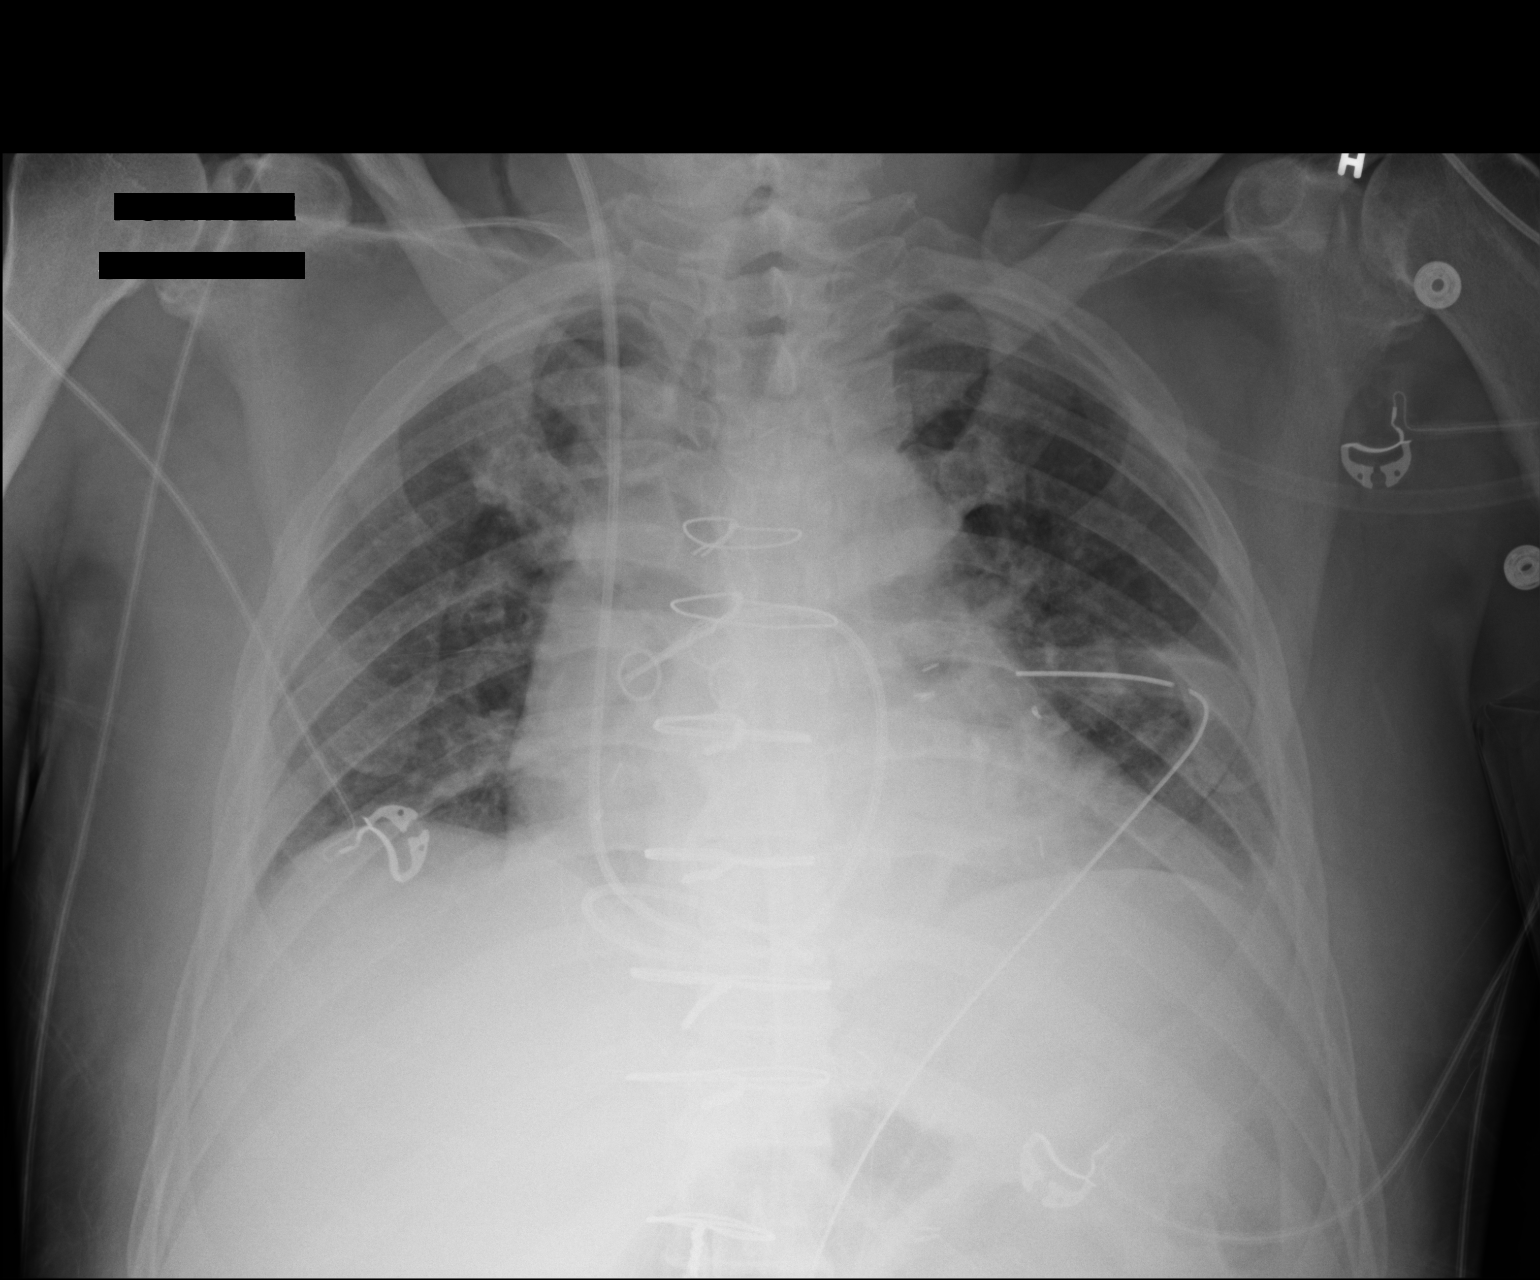

[1 of 1 positions shown; findings below may reference images not displayed]

FINDINGS: Cardiomediastinal silhouette is stable. Status post CABG. Stable
right IJ Swan-Ganz catheter position endotracheal tube and NG tube
has been removed. Stable left chest tube position. There is no
pneumothorax. Trace left pleural effusion left basilar atelectasis.
IMPRESSION: Status post CABG. Stable right IJ Swan-Ganz catheter position
endotracheal tube and NG tube has been removed. Stable left chest
tube position. There is no pneumothorax. Trace left pleural effusion
left basilar atelectasis.

## 2017-08-02 IMAGING — CR DG CHEST 2V
2 series · 2 of 2 positions shown · non-contrast
Comparison: Portable chest x-ray July 06, 2015

CLINICAL DATA: Status post CABG 3 days ago

EXAM:
CHEST  2 VIEW

[chest pa]
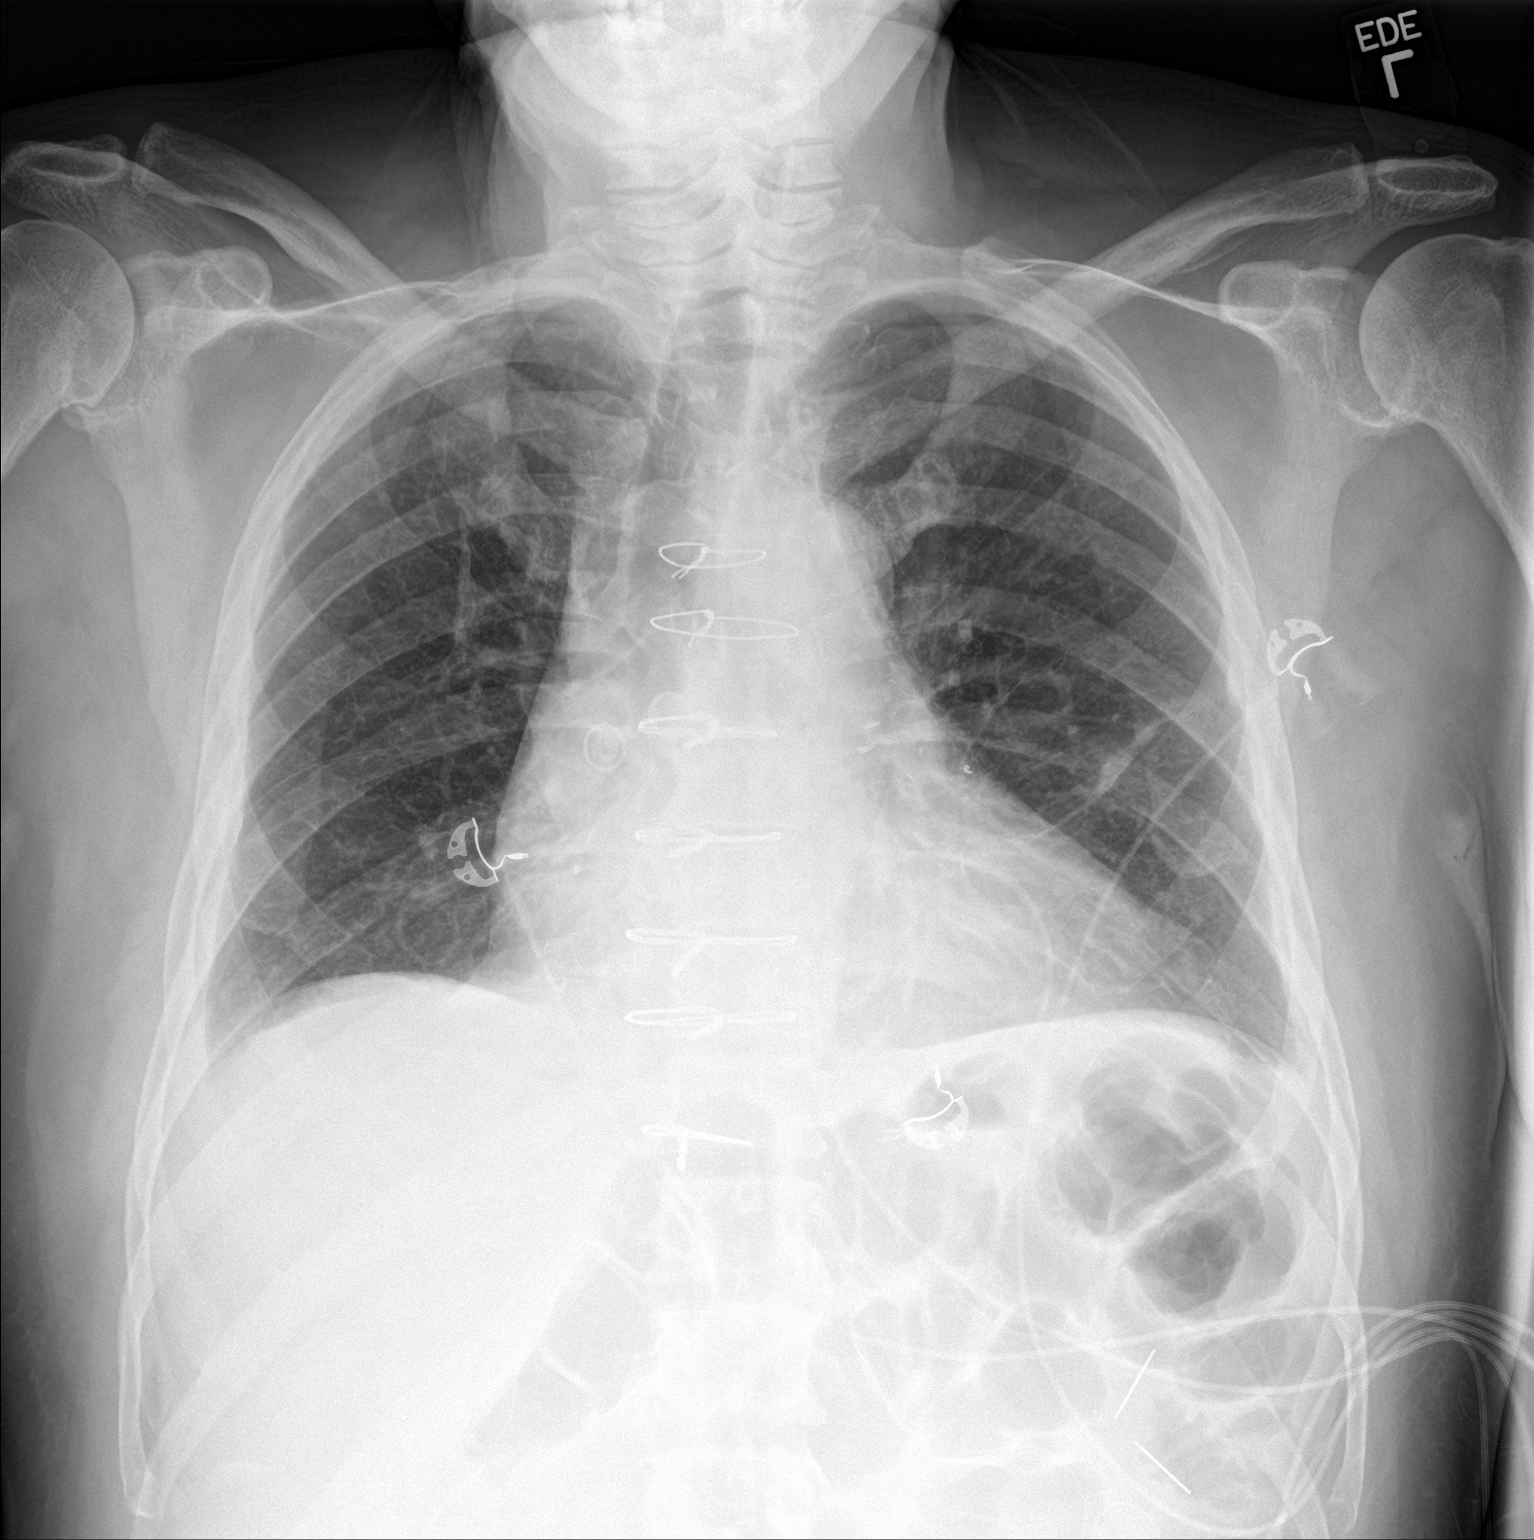

[chest lat]
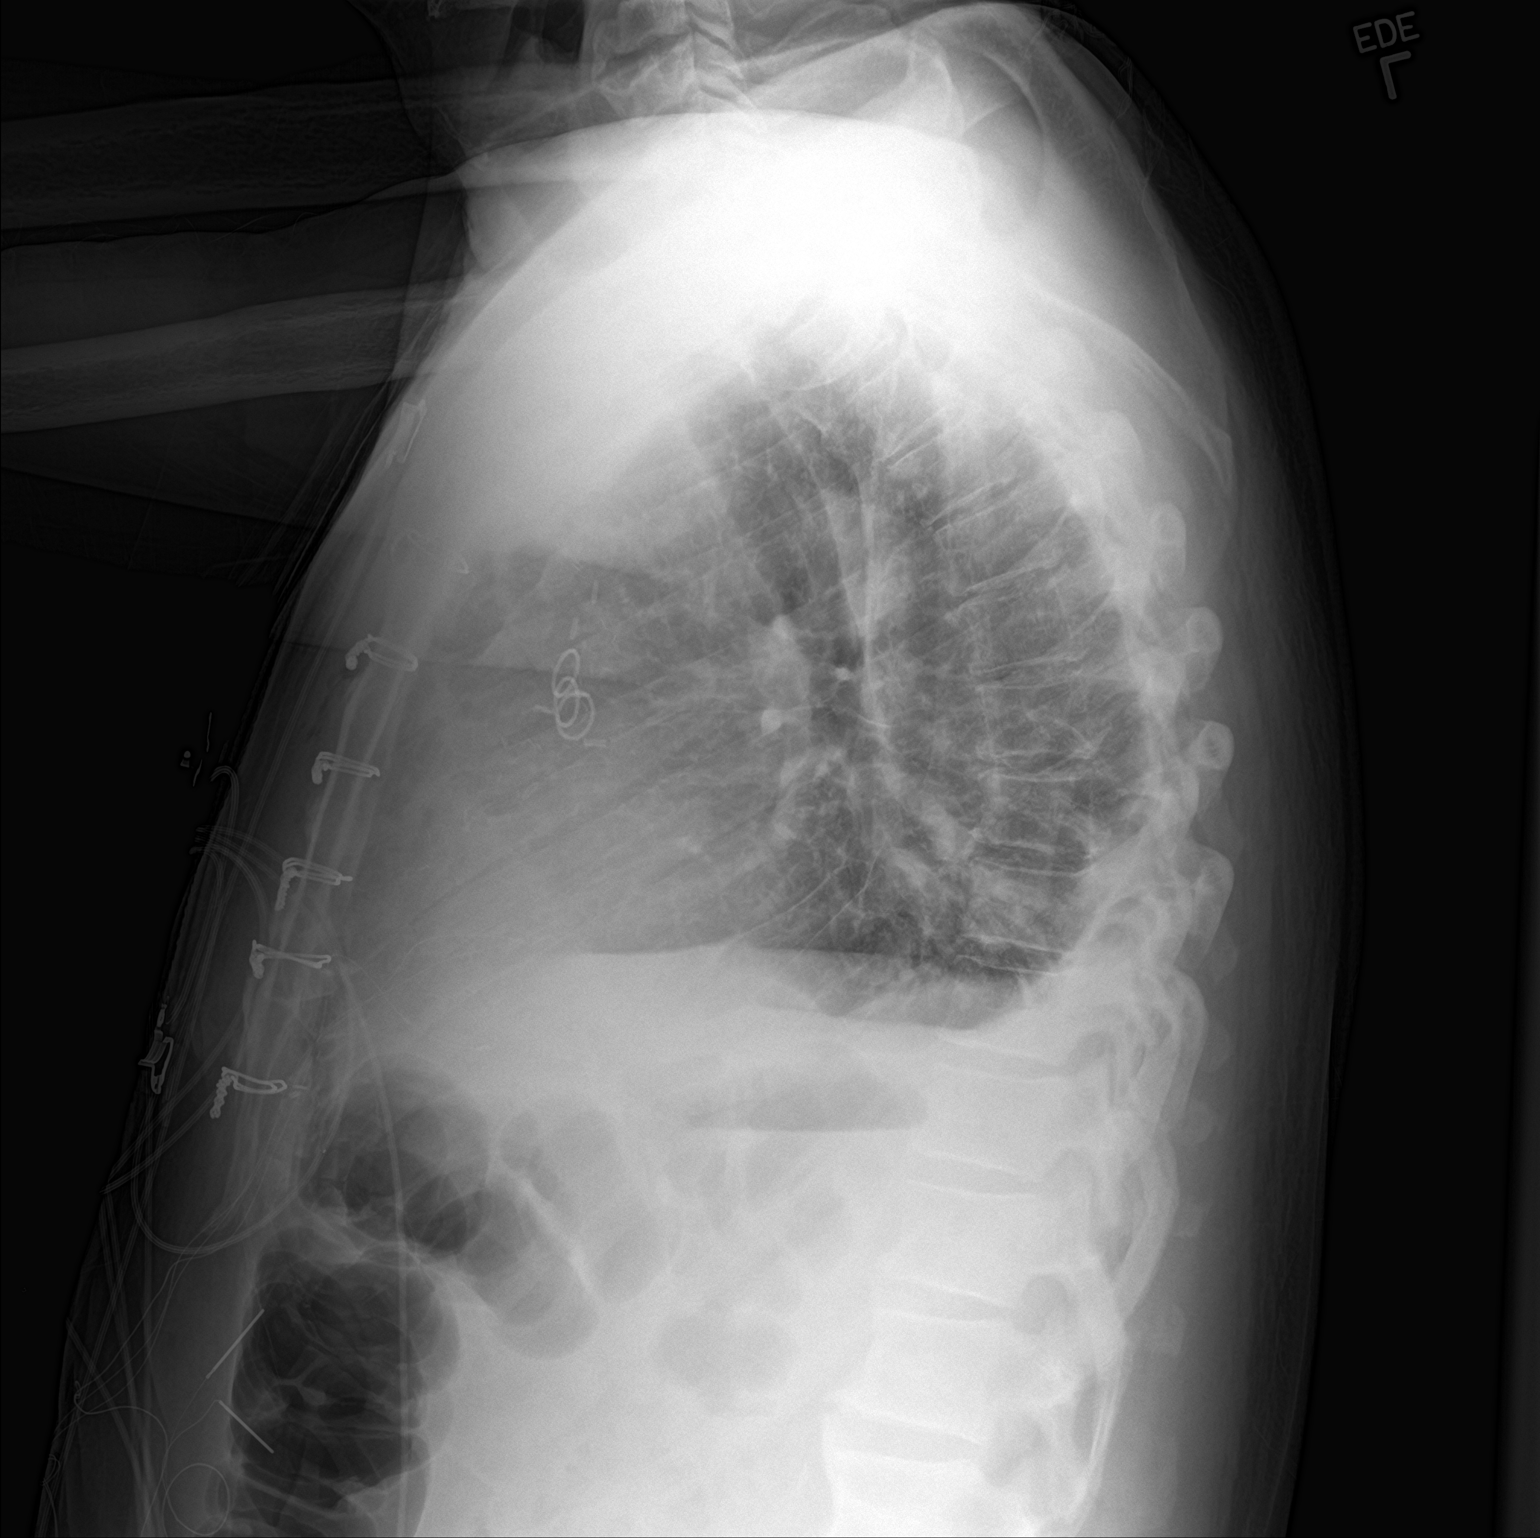

[2 of 2 positions shown; findings below may reference images not displayed]

FINDINGS: There has been interval removal of the left-sided chest tube and the
right internal jugular Cordis sheath. The lungs are adequately
inflated. There is no focal infiltrate or pneumothorax. Traces of
pleural fluid blunt the costophrenic angles. The cardiac silhouette
is mildly enlarged but stable. The pulmonary vascularity is
normalized. The sternal wires are intact. The retrosternal soft
tissues appear normal.
IMPRESSION: Small bilateral pleural effusions blunting the costophrenic angles.
Interval resolution of mild interstitial edema. Stable cardiomegaly.

## 2017-10-08 DIAGNOSIS — E782 Mixed hyperlipidemia: Secondary | ICD-10-CM | POA: Diagnosis not present

## 2017-10-08 DIAGNOSIS — Z23 Encounter for immunization: Secondary | ICD-10-CM | POA: Diagnosis not present

## 2017-10-08 DIAGNOSIS — N183 Chronic kidney disease, stage 3 (moderate): Secondary | ICD-10-CM | POA: Diagnosis not present

## 2017-10-08 DIAGNOSIS — I25119 Atherosclerotic heart disease of native coronary artery with unspecified angina pectoris: Secondary | ICD-10-CM | POA: Diagnosis not present

## 2017-10-08 DIAGNOSIS — Z794 Long term (current) use of insulin: Secondary | ICD-10-CM | POA: Diagnosis not present

## 2017-10-08 DIAGNOSIS — E1121 Type 2 diabetes mellitus with diabetic nephropathy: Secondary | ICD-10-CM | POA: Diagnosis not present

## 2017-10-08 DIAGNOSIS — N529 Male erectile dysfunction, unspecified: Secondary | ICD-10-CM | POA: Diagnosis not present

## 2017-10-08 DIAGNOSIS — I1310 Hypertensive heart and chronic kidney disease without heart failure, with stage 1 through stage 4 chronic kidney disease, or unspecified chronic kidney disease: Secondary | ICD-10-CM | POA: Diagnosis not present

## 2017-10-14 DIAGNOSIS — M7652 Patellar tendinitis, left knee: Secondary | ICD-10-CM | POA: Diagnosis not present

## 2017-11-11 DIAGNOSIS — M25562 Pain in left knee: Secondary | ICD-10-CM | POA: Diagnosis not present

## 2018-03-18 DIAGNOSIS — R350 Frequency of micturition: Secondary | ICD-10-CM | POA: Diagnosis not present

## 2018-03-31 ENCOUNTER — Other Ambulatory Visit: Payer: Self-pay | Admitting: Endocrinology

## 2018-07-22 ENCOUNTER — Other Ambulatory Visit: Payer: Self-pay | Admitting: Critical Care Medicine

## 2018-07-22 DIAGNOSIS — R6889 Other general symptoms and signs: Secondary | ICD-10-CM | POA: Diagnosis not present

## 2018-07-22 DIAGNOSIS — Z20822 Contact with and (suspected) exposure to covid-19: Secondary | ICD-10-CM

## 2018-07-28 LAB — NOVEL CORONAVIRUS, NAA: SARS-CoV-2, NAA: NOT DETECTED

## 2018-08-17 DIAGNOSIS — N183 Chronic kidney disease, stage 3 (moderate): Secondary | ICD-10-CM | POA: Diagnosis not present

## 2018-08-17 DIAGNOSIS — E782 Mixed hyperlipidemia: Secondary | ICD-10-CM | POA: Diagnosis not present

## 2018-08-17 DIAGNOSIS — E1121 Type 2 diabetes mellitus with diabetic nephropathy: Secondary | ICD-10-CM | POA: Diagnosis not present

## 2018-08-19 DIAGNOSIS — E113599 Type 2 diabetes mellitus with proliferative diabetic retinopathy without macular edema, unspecified eye: Secondary | ICD-10-CM | POA: Diagnosis not present

## 2018-08-19 DIAGNOSIS — N529 Male erectile dysfunction, unspecified: Secondary | ICD-10-CM | POA: Diagnosis not present

## 2018-08-19 DIAGNOSIS — E1122 Type 2 diabetes mellitus with diabetic chronic kidney disease: Secondary | ICD-10-CM | POA: Diagnosis not present

## 2018-08-19 DIAGNOSIS — E1121 Type 2 diabetes mellitus with diabetic nephropathy: Secondary | ICD-10-CM | POA: Diagnosis not present

## 2018-08-19 DIAGNOSIS — E782 Mixed hyperlipidemia: Secondary | ICD-10-CM | POA: Diagnosis not present

## 2018-08-19 DIAGNOSIS — Z794 Long term (current) use of insulin: Secondary | ICD-10-CM | POA: Diagnosis not present

## 2018-08-19 DIAGNOSIS — N183 Chronic kidney disease, stage 3 (moderate): Secondary | ICD-10-CM | POA: Diagnosis not present

## 2018-08-19 DIAGNOSIS — Z0000 Encounter for general adult medical examination without abnormal findings: Secondary | ICD-10-CM | POA: Diagnosis not present

## 2018-08-19 DIAGNOSIS — I1310 Hypertensive heart and chronic kidney disease without heart failure, with stage 1 through stage 4 chronic kidney disease, or unspecified chronic kidney disease: Secondary | ICD-10-CM | POA: Diagnosis not present

## 2018-08-19 DIAGNOSIS — Z125 Encounter for screening for malignant neoplasm of prostate: Secondary | ICD-10-CM | POA: Diagnosis not present

## 2018-08-19 DIAGNOSIS — I25119 Atherosclerotic heart disease of native coronary artery with unspecified angina pectoris: Secondary | ICD-10-CM | POA: Diagnosis not present

## 2018-09-28 ENCOUNTER — Telehealth: Payer: Self-pay

## 2018-09-28 NOTE — Telephone Encounter (Signed)
ATC patient to schedule appointment. Unable to leave message. Voice mail box full.

## 2018-09-28 NOTE — Telephone Encounter (Signed)
Per Dr. Ellison, unable to refill Novolin 70/30 without an appt. Routing this message to the front desk for scheduling purposes.  

## 2018-10-02 ENCOUNTER — Other Ambulatory Visit: Payer: Self-pay | Admitting: Endocrinology

## 2018-10-02 ENCOUNTER — Other Ambulatory Visit: Payer: Self-pay

## 2018-10-02 MED ORDER — INSULIN NPH (HUMAN) (ISOPHANE) 100 UNIT/ML ~~LOC~~ SUSP: 35.0000 [IU] | SUBCUTANEOUS | 6 refills | Status: AC

## 2018-10-02 NOTE — Telephone Encounter (Signed)
We have pt on NPH.  I sent refill x 1

## 2018-10-02 NOTE — Telephone Encounter (Signed)
Patient has been scheduled for appointment on 10/06/18 at 11:15 a.m.

## 2018-10-02 NOTE — Telephone Encounter (Signed)
A refill request was received for 70/30 but I am not seeing this on his list? Please advise

## 2018-10-06 ENCOUNTER — Ambulatory Visit: Payer: Medicare HMO | Admitting: Endocrinology

## 2018-10-06 ENCOUNTER — Other Ambulatory Visit: Payer: Self-pay

## 2018-10-06 ENCOUNTER — Encounter: Payer: Self-pay | Admitting: Endocrinology

## 2018-10-06 VITALS — BP 132/64 | HR 80 | Ht 70.0 in | Wt 172.2 lb

## 2018-10-06 DIAGNOSIS — Z794 Long term (current) use of insulin: Secondary | ICD-10-CM | POA: Diagnosis not present

## 2018-10-06 DIAGNOSIS — E1122 Type 2 diabetes mellitus with diabetic chronic kidney disease: Secondary | ICD-10-CM

## 2018-10-06 DIAGNOSIS — Z23 Encounter for immunization: Secondary | ICD-10-CM | POA: Diagnosis not present

## 2018-10-06 DIAGNOSIS — N183 Chronic kidney disease, stage 3 unspecified: Secondary | ICD-10-CM

## 2018-10-06 LAB — POCT GLYCOSYLATED HEMOGLOBIN (HGB A1C): Hemoglobin A1C: 7.7 % — AB (ref 4.0–5.6)

## 2018-10-06 NOTE — Patient Instructions (Addendum)
Please continue the same insulin. On this type of insulin schedule, you should eat meals on a regular schedule, especially lunch.  If a meal is missed or significantly delayed, your blood sugar could go low.  Therefore, you should carry a non-perishable snack, such as crackers, while at work.   check your blood sugar twice a day.  vary the time of day when you check, between before the 3 meals, and at bedtime.  also check if you have symptoms of your blood sugar being too high or too low.  please keep a record of the readings and bring it to your next appointment here (or you can bring the meter itself).  You can write it on any piece of paper.  please call us sooner if your blood sugar goes below 70, or if you have a lot of readings over 200.   Please come back for a follow-up appointment in 4 months.

## 2018-10-06 NOTE — Progress Notes (Signed)
Subjective:    Patient ID: Joseph Sanford, male    DOB: 14-Nov-1945, 73 y.o.   MRN: [id number]  HPI Pt returns for f/u of diabetes mellitus: DM type: Insulin-requiring type 2 Dx'ed: 0000000 Complications: polyneuropathy, CAD, retinopathy, TIA, and renal insufficiency.   Therapy: insulin since 2000 DKA: never Severe hypoglycemia: never Pancreatitis: never.  Other: he stopped using V-GO, due to cost; he stopped metformin, due to diarrhea; he takes QD insulin, after poor results with multiple daily injections.   Interval history: pt says he can afford his NPH insulin, but he still misses once per week.  pt states he feels well in general.  Pt says he seldom checks cbg.   Past Medical History:  Diagnosis Date  . Chronic kidney disease   . Coronary artery disease   . CVA (cerebral infarction)    08/2011, NO RESIDUAL DEFICITS  . Diabetes mellitus without complication (HCC)    type II  . ED (erectile dysfunction)   . Gout   . History of kidney stones   . Hyperlipidemia   . Hypertension   . Irregular heart beat    "a long time ago"  . Myocardial infarction ([place])    1997    Past Surgical History:  Procedure Laterality Date  . CARDIAC CATHETERIZATION N/A 06/20/2015   Procedure: Left Heart Cath and Coronary Angiography;  Surgeon: Belva Crome, MD;  Location: [facility];  Service: Cardiovascular;  Laterality: N/A;  . CARDIAC CATHETERIZATION  1997   angioplasty  . CARDIAC CATHETERIZATION  2010   stents placed  . CATARACT EXTRACTION W/ INTRAOCULAR LENS  IMPLANT, BILATERAL    . COLONOSCOPY    . CORONARY ARTERY BYPASS GRAFT N/A 07/04/2015   Procedure: CORONARY ARTERY BYPASS GRAFTING (CABG) x 4;  Surgeon: Ivin Poot, MD;  Location: [facility];  Service: Open Heart Surgery;  Laterality: N/A;  . KNEE SURGERY Right    some type of knee cap surgery per pt  . MULTIPLE TOOTH EXTRACTIONS    . TEE WITHOUT CARDIOVERSION N/A 07/04/2015   Procedure: TRANSESOPHAGEAL ECHOCARDIOGRAM (TEE);   Surgeon: Ivin Poot, MD;  Location: [facility];  Service: Open Heart Surgery;  Laterality: N/A;    Social History   Socioeconomic History  . Marital status: Widowed    Spouse name: Not on file  . Number of children: Not on file  . Years of education: Not on file  . Highest education level: Not on file  Occupational History  . Not on file  Social Needs  . Financial resource strain: Not on file  . Food insecurity    Worry: Not on file    Inability: Not on file  . Transportation needs    Medical: Not on file    Non-medical: Not on file  Tobacco Use  . Smoking status: Never Smoker  . Smokeless tobacco: Never Used  Substance and Sexual Activity  . Alcohol use: No    Alcohol/week: 0.0 standard drinks  . Drug use: No  . Sexual activity: Not on file  Lifestyle  . Physical activity    Days per week: Not on file    Minutes per session: Not on file  . Stress: Not on file  Relationships  . Social Herbalist on phone: Not on file    Gets together: Not on file    Attends religious service: Not on file    Active member of club or organization: Not on file  Attends meetings of clubs or organizations: Not on file    Relationship status: Not on file  . Intimate partner violence    Fear of current or ex partner: Not on file    Emotionally abused: Not on file    Physically abused: Not on file    Forced sexual activity: Not on file  Other Topics Concern  . Not on file  Social History Narrative  . Not on file    Current Outpatient Medications on File Prior to Visit  Medication Sig Dispense Refill  . aspirin EC 81 MG tablet Take 1 tablet (81 mg total) by mouth daily. 90 tablet 3  . Biotin 10000 MCG TABS Take 10,000 mcg by mouth daily.    . Calcium Carb-Cholecalciferol (CALCIUM 600 + D PO) Take 600 mg by mouth daily.    . Cholecalciferol (VITAMIN D3 SUPER STRENGTH PO) Take 1 tablet by mouth daily.    . Coenzyme Q10 (COQ10) 200 MG CAPS Take 200 mg by mouth daily.    .  Cyanocobalamin (VITAMIN B-12) 5000 MCG TBDP Take 5,000 mcg by mouth daily.    Marland Kitchen ezetimibe (ZETIA) 10 MG tablet Take 1 tablet (10 mg total) by mouth daily. 90 tablet 3  . Folic Acid 0.8 MG CAPS Take 1 capsule by mouth daily.    . Garlic 123XX123 MG CAPS Take 1,000 mg by mouth daily.    . insulin NPH Human (NOVOLIN N RELION) 100 UNIT/ML injection Inject 0.35 mLs (35 Units total) into the skin every morning. And syringes 1/day 10 mL 6  . irbesartan (AVAPRO) 150 MG tablet Take 1 tablet (150 mg total) by mouth daily. 90 tablet 3  . Magnesium 250 MG TABS Take 250 mg by mouth daily.    . Omega-3 Fatty Acids (FISH OIL) 1200 MG CAPS Take 1 capsule by mouth daily.    Marland Kitchen OVER THE COUNTER MEDICATION Take 1 Package by mouth 2 (two) times daily. [employer] health Supplement Pack    . pyridOXINE (VITAMIN B-6) 100 MG tablet Take 100 mg by mouth daily.    . rosuvastatin (CRESTOR) 40 MG tablet Take 1 tablet (40 mg total) by mouth daily. 90 tablet 2  . Thiamine HCl (VITAMIN B-1) 250 MG tablet Take 250 mg by mouth daily.    . Zinc 50 MG CAPS Take 50 mg by mouth daily.      No current facility-administered medications on file prior to visit.     Allergies  Allergen Reactions  . Contrast Media [Iodinated Diagnostic Agents] Other (See Comments)    "bumps on arm"  . Lisinopril Other (See Comments)    cough  . Lipitor [Atorvastatin] Other (See Comments)    Brain fog  . Metformin And Related Other (See Comments)    "STOMACH UPSET"  . Iodine Nausea And Vomiting  . Shellfish Allergy Nausea And Vomiting    Reaction to shrimp    Family History  Problem Relation Age of Onset  . Diabetes Brother     BP 132/64 (BP Location: Left Arm, Patient Position: Sitting, Cuff Size: Normal)   Pulse 80   Ht 5\' 10"  (1.778 m)   Wt 172 lb 3.2 oz (78.1 kg)   SpO2 98%   BMI 24.71 kg/m    Review of Systems He denies hypoglycemia.      Objective:   Physical Exam VITAL SIGNS:  See vs page GENERAL: no distress  Pulses: dorsalis pedis intact bilat.   MSK: no deformity of the feet CV:  no leg edema Skin:  no ulcer on the feet.  normal color and temp on the feet. Neuro: sensation is intact to touch on the feet  Lab Results  Component Value Date   HGBA1C 7.7 (A) 10/06/2018   Lab Results  Component Value Date   CREATININE 1.27 09/24/2016   BUN 15 09/24/2016   NA 144 09/24/2016   K 4.7 09/24/2016   CL 103 09/24/2016   CO2 25 09/24/2016       Assessment & Plan:  Insulin-requiring type 2 DM, with DR: this is the best control this pt should aim for, given this regimen, which does match insulin to her changing needs throughout the day Renal insuff: in this setting, he needs intermediate-acting qam insulin Noncompliance with cbg recording: this is limiting effectiveness of DM rx.  Patient Instructions  Please continue the same insulin. On this type of insulin schedule, you should eat meals on a regular schedule, especially lunch.  If a meal is missed or significantly delayed, your blood sugar could go low.  Therefore, you should carry a non-perishable snack, such as crackers, while at work.   check your blood sugar twice a day.  vary the time of day when you check, between before the 3 meals, and at bedtime.  also check if you have symptoms of your blood sugar being too high or too low.  please keep a record of the readings and bring it to your next appointment here (or you can bring the meter itself).  You can write it on any piece of paper.  please call us sooner if your blood sugar goes below 70, or if you have a lot of readings over 200.   Please come back for a follow-up appointment in 4 months.

## 2018-11-18 DIAGNOSIS — E782 Mixed hyperlipidemia: Secondary | ICD-10-CM | POA: Diagnosis not present

## 2018-11-18 DIAGNOSIS — Z794 Long term (current) use of insulin: Secondary | ICD-10-CM | POA: Diagnosis not present

## 2018-11-18 DIAGNOSIS — E1121 Type 2 diabetes mellitus with diabetic nephropathy: Secondary | ICD-10-CM | POA: Diagnosis not present

## 2018-11-23 DIAGNOSIS — Z7189 Other specified counseling: Secondary | ICD-10-CM | POA: Diagnosis not present

## 2018-11-23 DIAGNOSIS — I1310 Hypertensive heart and chronic kidney disease without heart failure, with stage 1 through stage 4 chronic kidney disease, or unspecified chronic kidney disease: Secondary | ICD-10-CM | POA: Diagnosis not present

## 2018-11-23 DIAGNOSIS — E1121 Type 2 diabetes mellitus with diabetic nephropathy: Secondary | ICD-10-CM | POA: Diagnosis not present

## 2018-11-23 DIAGNOSIS — I25119 Atherosclerotic heart disease of native coronary artery with unspecified angina pectoris: Secondary | ICD-10-CM | POA: Diagnosis not present

## 2018-11-23 DIAGNOSIS — E782 Mixed hyperlipidemia: Secondary | ICD-10-CM | POA: Diagnosis not present

## 2018-11-23 DIAGNOSIS — Z794 Long term (current) use of insulin: Secondary | ICD-10-CM | POA: Diagnosis not present

## 2018-11-23 DIAGNOSIS — N183 Chronic kidney disease, stage 3 unspecified: Secondary | ICD-10-CM | POA: Diagnosis not present

## 2018-12-01 DIAGNOSIS — E113293 Type 2 diabetes mellitus with mild nonproliferative diabetic retinopathy without macular edema, bilateral: Secondary | ICD-10-CM | POA: Diagnosis not present

## 2018-12-01 DIAGNOSIS — H353121 Nonexudative age-related macular degeneration, left eye, early dry stage: Secondary | ICD-10-CM | POA: Diagnosis not present

## 2018-12-01 DIAGNOSIS — H524 Presbyopia: Secondary | ICD-10-CM | POA: Diagnosis not present

## 2018-12-01 DIAGNOSIS — Z961 Presence of intraocular lens: Secondary | ICD-10-CM | POA: Diagnosis not present

## 2019-02-16 ENCOUNTER — Ambulatory Visit: Payer: Medicare HMO | Admitting: Endocrinology

## 2019-03-19 ENCOUNTER — Ambulatory Visit: Payer: Medicare HMO | Admitting: Endocrinology

## 2019-04-19 DIAGNOSIS — E7800 Pure hypercholesterolemia, unspecified: Secondary | ICD-10-CM | POA: Diagnosis not present

## 2019-07-11 DIAGNOSIS — M109 Gout, unspecified: Secondary | ICD-10-CM | POA: Diagnosis not present

## 2019-07-29 DIAGNOSIS — M109 Gout, unspecified: Secondary | ICD-10-CM | POA: Diagnosis not present

## 2019-07-29 DIAGNOSIS — S8012XA Contusion of left lower leg, initial encounter: Secondary | ICD-10-CM | POA: Diagnosis not present

## 2019-08-19 DIAGNOSIS — M109 Gout, unspecified: Secondary | ICD-10-CM | POA: Diagnosis not present

## 2019-08-20 DIAGNOSIS — M109 Gout, unspecified: Secondary | ICD-10-CM | POA: Diagnosis not present

## 2019-08-20 DIAGNOSIS — I25119 Atherosclerotic heart disease of native coronary artery with unspecified angina pectoris: Secondary | ICD-10-CM | POA: Diagnosis not present

## 2019-09-14 DIAGNOSIS — E782 Mixed hyperlipidemia: Secondary | ICD-10-CM | POA: Diagnosis not present

## 2019-09-14 DIAGNOSIS — N183 Chronic kidney disease, stage 3 unspecified: Secondary | ICD-10-CM | POA: Diagnosis not present

## 2019-09-14 DIAGNOSIS — Z0000 Encounter for general adult medical examination without abnormal findings: Secondary | ICD-10-CM | POA: Diagnosis not present

## 2019-09-14 DIAGNOSIS — I25119 Atherosclerotic heart disease of native coronary artery with unspecified angina pectoris: Secondary | ICD-10-CM | POA: Diagnosis not present

## 2019-09-14 DIAGNOSIS — M109 Gout, unspecified: Secondary | ICD-10-CM | POA: Diagnosis not present

## 2019-09-14 DIAGNOSIS — Z7189 Other specified counseling: Secondary | ICD-10-CM | POA: Diagnosis not present

## 2019-09-14 DIAGNOSIS — E1121 Type 2 diabetes mellitus with diabetic nephropathy: Secondary | ICD-10-CM | POA: Diagnosis not present

## 2019-09-14 DIAGNOSIS — Z125 Encounter for screening for malignant neoplasm of prostate: Secondary | ICD-10-CM | POA: Diagnosis not present

## 2019-09-14 DIAGNOSIS — I1310 Hypertensive heart and chronic kidney disease without heart failure, with stage 1 through stage 4 chronic kidney disease, or unspecified chronic kidney disease: Secondary | ICD-10-CM | POA: Diagnosis not present

## 2019-12-24 DIAGNOSIS — Z91120 Patient's intentional underdosing of medication regimen due to financial hardship: Secondary | ICD-10-CM | POA: Diagnosis not present

## 2019-12-24 DIAGNOSIS — I1310 Hypertensive heart and chronic kidney disease without heart failure, with stage 1 through stage 4 chronic kidney disease, or unspecified chronic kidney disease: Secondary | ICD-10-CM | POA: Diagnosis not present

## 2019-12-24 DIAGNOSIS — E1121 Type 2 diabetes mellitus with diabetic nephropathy: Secondary | ICD-10-CM | POA: Diagnosis not present

## 2019-12-24 DIAGNOSIS — N183 Chronic kidney disease, stage 3 unspecified: Secondary | ICD-10-CM | POA: Diagnosis not present

## 2019-12-24 DIAGNOSIS — E7800 Pure hypercholesterolemia, unspecified: Secondary | ICD-10-CM | POA: Diagnosis not present

## 2019-12-24 DIAGNOSIS — Z794 Long term (current) use of insulin: Secondary | ICD-10-CM | POA: Diagnosis not present

## 2019-12-24 DIAGNOSIS — I25119 Atherosclerotic heart disease of native coronary artery with unspecified angina pectoris: Secondary | ICD-10-CM | POA: Diagnosis not present

## 2019-12-24 DIAGNOSIS — E1122 Type 2 diabetes mellitus with diabetic chronic kidney disease: Secondary | ICD-10-CM | POA: Diagnosis not present

## 2020-04-28 DIAGNOSIS — N1831 Chronic kidney disease, stage 3a: Secondary | ICD-10-CM | POA: Diagnosis not present

## 2020-04-28 DIAGNOSIS — E113293 Type 2 diabetes mellitus with mild nonproliferative diabetic retinopathy without macular edema, bilateral: Secondary | ICD-10-CM | POA: Diagnosis not present

## 2020-04-28 DIAGNOSIS — I129 Hypertensive chronic kidney disease with stage 1 through stage 4 chronic kidney disease, or unspecified chronic kidney disease: Secondary | ICD-10-CM | POA: Diagnosis not present

## 2020-04-28 DIAGNOSIS — I25708 Atherosclerosis of coronary artery bypass graft(s), unspecified, with other forms of angina pectoris: Secondary | ICD-10-CM | POA: Diagnosis not present

## 2020-04-28 DIAGNOSIS — R32 Unspecified urinary incontinence: Secondary | ICD-10-CM | POA: Diagnosis not present

## 2020-04-28 DIAGNOSIS — Z008 Encounter for other general examination: Secondary | ICD-10-CM | POA: Diagnosis not present

## 2021-03-20 ENCOUNTER — Ambulatory Visit
Admission: RE | Admit: 2021-03-20 | Discharge: 2021-03-20 | Disposition: A | Discharge status: Home / Self Care | Payer: Medicare HMO | Source: Ambulatory Visit | Attending: Family Medicine | Admitting: Family Medicine

## 2021-03-20 ENCOUNTER — Other Ambulatory Visit: Payer: Self-pay | Admitting: Family Medicine

## 2021-03-20 DIAGNOSIS — M79671 Pain in right foot: Secondary | ICD-10-CM

## 2021-03-21 ENCOUNTER — Other Ambulatory Visit: Payer: Self-pay | Admitting: Registered Nurse

## 2021-03-21 DIAGNOSIS — R32 Unspecified urinary incontinence: Secondary | ICD-10-CM

## 2021-03-26 ENCOUNTER — Ambulatory Visit
Admission: RE | Admit: 2021-03-26 | Discharge: 2021-03-26 | Disposition: A | Discharge status: Home / Self Care | Payer: Medicare HMO | Source: Ambulatory Visit | Attending: Registered Nurse | Admitting: Registered Nurse

## 2021-03-26 ENCOUNTER — Other Ambulatory Visit: Payer: Self-pay

## 2021-03-26 DIAGNOSIS — R32 Unspecified urinary incontinence: Secondary | ICD-10-CM

## 2022-02-25 ENCOUNTER — Ambulatory Visit
Admission: RE | Admit: 2022-02-25 | Discharge: 2022-02-25 | Disposition: A | Discharge status: Home / Self Care | Payer: Medicare PPO | Source: Ambulatory Visit | Attending: Family Medicine | Admitting: Family Medicine

## 2022-02-25 ENCOUNTER — Other Ambulatory Visit: Payer: Self-pay | Admitting: Family Medicine

## 2022-02-25 DIAGNOSIS — M25562 Pain in left knee: Secondary | ICD-10-CM

## 2022-05-01 ENCOUNTER — Encounter: Payer: Self-pay | Admitting: Podiatry

## 2022-05-01 ENCOUNTER — Ambulatory Visit: Payer: Medicare HMO | Admitting: Podiatry

## 2022-05-01 VITALS — BP 152/65 | HR 70

## 2022-05-01 DIAGNOSIS — M79674 Pain in right toe(s): Secondary | ICD-10-CM

## 2022-05-01 DIAGNOSIS — B351 Tinea unguium: Secondary | ICD-10-CM | POA: Insufficient documentation

## 2022-05-01 DIAGNOSIS — M79675 Pain in left toe(s): Secondary | ICD-10-CM

## 2022-05-01 DIAGNOSIS — E119 Type 2 diabetes mellitus without complications: Secondary | ICD-10-CM | POA: Insufficient documentation

## 2022-05-01 NOTE — Progress Notes (Signed)
This patient presents to my office for at risk foot care.  This patient requires this care by a professional since this patient will be at risk due to having diabetes.  He says he developed a painful tip of second toe left foot.  He says the skin lesion burst open about a week ago.  He says the toe has significantly healed.   No drainage noted from second toe. This patient presents for at risk foot care today.  General Appearance  Alert, conversant and in no acute stress.  Vascular  Dorsalis pedis and posterior tibial  pulses are palpable  bilaterally.  Capillary return is within normal limits  bilaterally. Temperature is within normal limits  bilaterally.  Neurologic  Senn-Weinstein monofilament wire test within normal limits  bilaterally. Muscle power within normal limits bilaterally.  Nails Thick disfigured discolored nails with subungual debris  hallux nails bilaterally. No evidence of bacterial infection or drainage bilaterally.  Orthopedic  No limitations of motion  feet .  No crepitus or effusions noted.  No bony pathology or digital deformities noted.  Skin  normotropic skin with no porokeratosis noted bilaterally.  No signs of infections or ulcers noted.     Pre-ulcerous skin lesion second toe left foot.  Consent was obtained for treatment procedures.   The healing callus was debrided with dremel tool.   Diabetic foot exam was performed with no vascular or neurologic pathology.   Return office visit   4 months                   Told patient to return for periodic foot care and evaluation due to potential at risk complications.   Helane Gunther DPM

## 2022-09-03 ENCOUNTER — Ambulatory Visit: Payer: Medicare HMO | Admitting: Podiatry

## 2022-09-03 ENCOUNTER — Encounter: Payer: Self-pay | Admitting: Podiatry

## 2022-09-03 DIAGNOSIS — E119 Type 2 diabetes mellitus without complications: Secondary | ICD-10-CM

## 2022-09-03 DIAGNOSIS — B351 Tinea unguium: Secondary | ICD-10-CM

## 2022-09-03 DIAGNOSIS — M79674 Pain in right toe(s): Secondary | ICD-10-CM | POA: Diagnosis not present

## 2022-09-03 DIAGNOSIS — M79675 Pain in left toe(s): Secondary | ICD-10-CM

## 2022-09-03 NOTE — Progress Notes (Signed)
This patient returns to my office for at risk foot care.  This patient requires this care by a professional since this patient will be at risk due to having diabetes.  This patient is unable to cut nails himself since the patient cannot reach his nails.These nails are painful walking and wearing shoes.  This patient presents for at risk foot care today.  General Appearance  Alert, conversant and in no acute stress.  Vascular  Dorsalis pedis and posterior tibial  pulses are palpable  bilaterally.  Capillary return is within normal limits  bilaterally. Temperature is within normal limits  bilaterally.  Neurologic  Senn-Weinstein monofilament wire test within normal limits  bilaterally. Muscle power within normal limits bilaterally.  Nails Thick disfigured discolored nails with subungual debris  from hallux to fifth toes bilaterally. No evidence of bacterial infection or drainage bilaterally.  Orthopedic  No limitations of motion  feet .  No crepitus or effusions noted.  No bony pathology or digital deformities noted.  HAV  B/L.  Skin  normotropic skin with no porokeratosis noted bilaterally.  No signs of infections or ulcers noted.     Onychomycosis  Pain in right toes  Pain in left toes  Consent was obtained for treatment procedures.   Mechanical debridement of nails 1-5  bilaterally performed with a nail nipper.  Filed with dremel without incident.    Return office visit  4 months                    Told patient to return for periodic foot care and evaluation due to potential at risk complications.   Gardiner Barefoot DPM

## 2022-11-26 DIAGNOSIS — I10 Essential (primary) hypertension: Secondary | ICD-10-CM | POA: Diagnosis not present

## 2022-11-26 DIAGNOSIS — Z0289 Encounter for other administrative examinations: Secondary | ICD-10-CM | POA: Diagnosis not present

## 2022-11-26 DIAGNOSIS — Z794 Long term (current) use of insulin: Secondary | ICD-10-CM | POA: Diagnosis not present

## 2022-11-26 DIAGNOSIS — N529 Male erectile dysfunction, unspecified: Secondary | ICD-10-CM | POA: Diagnosis not present

## 2022-11-26 DIAGNOSIS — E1142 Type 2 diabetes mellitus with diabetic polyneuropathy: Secondary | ICD-10-CM | POA: Diagnosis not present

## 2022-11-26 DIAGNOSIS — H9193 Unspecified hearing loss, bilateral: Secondary | ICD-10-CM | POA: Diagnosis not present

## 2022-11-26 DIAGNOSIS — E1159 Type 2 diabetes mellitus with other circulatory complications: Secondary | ICD-10-CM | POA: Diagnosis not present

## 2022-11-26 DIAGNOSIS — Z125 Encounter for screening for malignant neoplasm of prostate: Secondary | ICD-10-CM | POA: Diagnosis not present

## 2022-12-20 ENCOUNTER — Encounter: Payer: Self-pay | Admitting: Podiatry

## 2023-01-06 ENCOUNTER — Ambulatory Visit: Payer: Medicare HMO | Admitting: Podiatry

## 2023-01-09 ENCOUNTER — Encounter: Payer: Self-pay | Admitting: Podiatry

## 2023-01-09 ENCOUNTER — Ambulatory Visit: Payer: Medicare HMO | Admitting: Podiatry

## 2023-01-09 DIAGNOSIS — E119 Type 2 diabetes mellitus without complications: Secondary | ICD-10-CM

## 2023-01-09 DIAGNOSIS — M79675 Pain in left toe(s): Secondary | ICD-10-CM

## 2023-01-09 DIAGNOSIS — B351 Tinea unguium: Secondary | ICD-10-CM

## 2023-01-09 DIAGNOSIS — M79674 Pain in right toe(s): Secondary | ICD-10-CM

## 2023-01-09 NOTE — Progress Notes (Signed)
This patient returns to my office for at risk foot care.  This patient requires this care by a professional since this patient will be at risk due to having diabetes.    This patient is unable to cut nails himself since the patient cannot reach his nails.These nails are painful walking and wearing shoes.  This patient presents for at risk foot care today.  General Appearance  Alert, conversant and in no acute stress.  Vascular  Dorsalis pedis and posterior tibial  pulses are palpable  bilaterally.  Capillary return is within normal limits  bilaterally. Temperature is within normal limits  bilaterally.  Neurologic  Senn-Weinstein monofilament wire test within normal limits  bilaterally. Muscle power within normal limits bilaterally.  Nails Thick disfigured discolored nails with subungual debris  from hallux to fifth toes bilaterally. No evidence of bacterial infection or drainage bilaterally.  Orthopedic  No limitations of motion  feet .  No crepitus or effusions noted.  No bony pathology or digital deformities noted.  HAV  B/L.  Skin  normotropic skin with no porokeratosis noted bilaterally.  No signs of infections or ulcers noted.     Onychomycosis  Pain in right toes  Pain in left toes  Consent was obtained for treatment procedures.   Mechanical debridement of nails 1-5  bilaterally performed with a nail nipper.  Filed with dremel without incident.    Return office visit     3 months                 Told patient to return for periodic foot care and evaluation due to potential at risk complications.   Elnore Cosens DPM   

## 2023-04-15 ENCOUNTER — Ambulatory Visit: Payer: Medicare HMO | Admitting: Podiatry

## 2023-04-15 ENCOUNTER — Encounter: Payer: Self-pay | Admitting: Podiatry

## 2023-04-15 DIAGNOSIS — B351 Tinea unguium: Secondary | ICD-10-CM | POA: Diagnosis not present

## 2023-04-15 DIAGNOSIS — M79674 Pain in right toe(s): Secondary | ICD-10-CM

## 2023-04-15 DIAGNOSIS — E119 Type 2 diabetes mellitus without complications: Secondary | ICD-10-CM

## 2023-04-15 DIAGNOSIS — M79675 Pain in left toe(s): Secondary | ICD-10-CM | POA: Diagnosis not present

## 2023-04-15 NOTE — Progress Notes (Signed)
This patient returns to my office for at risk foot care.  This patient requires this care by a professional since this patient will be at risk due to having diabetes.    This patient is unable to cut nails himself since the patient cannot reach his nails.These nails are painful walking and wearing shoes.  This patient presents for at risk foot care today.  General Appearance  Alert, conversant and in no acute stress.  Vascular  Dorsalis pedis and posterior tibial  pulses are palpable  bilaterally.  Capillary return is within normal limits  bilaterally. Temperature is within normal limits  bilaterally.  Neurologic  Senn-Weinstein monofilament wire test within normal limits  bilaterally. Muscle power within normal limits bilaterally.  Nails Thick disfigured discolored nails with subungual debris  from hallux to fifth toes bilaterally. No evidence of bacterial infection or drainage bilaterally.  Orthopedic  No limitations of motion  feet .  No crepitus or effusions noted.  No bony pathology or digital deformities noted.  HAV  B/L.  Skin  normotropic skin with no porokeratosis noted bilaterally.  No signs of infections or ulcers noted.     Onychomycosis  Pain in right toes  Pain in left toes  Consent was obtained for treatment procedures.   Mechanical debridement of nails 1-5  bilaterally performed with a nail nipper.  Filed with dremel without incident.    Return office visit     3 months                 Told patient to return for periodic foot care and evaluation due to potential at risk complications.   Elnore Cosens DPM   

## 2023-07-15 ENCOUNTER — Ambulatory Visit: Admitting: Podiatry

## 2023-07-30 ENCOUNTER — Telehealth: Payer: Self-pay

## 2023-07-30 NOTE — Telephone Encounter (Signed)
 Received verification of chronic conditions form from [facility]. Filled out and faxed back to [facility].
